# Patient Record
Sex: Female | Born: 1950
Health system: Southern US, Community
[De-identification: ages and names within clinical notes are randomized; demographics above are authoritative.]

## PROBLEM LIST (undated history)

## (undated) DIAGNOSIS — M545 Low back pain, unspecified: Secondary | ICD-10-CM

## (undated) DIAGNOSIS — M797 Fibromyalgia: Secondary | ICD-10-CM

## (undated) DIAGNOSIS — R202 Paresthesia of skin: Secondary | ICD-10-CM

## (undated) DIAGNOSIS — I341 Nonrheumatic mitral (valve) prolapse: Secondary | ICD-10-CM

## (undated) DIAGNOSIS — E663 Overweight: Secondary | ICD-10-CM

## (undated) DIAGNOSIS — Z87898 Personal history of other specified conditions: Secondary | ICD-10-CM

## (undated) DIAGNOSIS — K5730 Diverticulosis of large intestine without perforation or abscess without bleeding: Secondary | ICD-10-CM

## (undated) DIAGNOSIS — M1990 Unspecified osteoarthritis, unspecified site: Secondary | ICD-10-CM

## (undated) DIAGNOSIS — R519 Headache, unspecified: Secondary | ICD-10-CM

## (undated) DIAGNOSIS — D649 Anemia, unspecified: Secondary | ICD-10-CM

## (undated) DIAGNOSIS — I10 Essential (primary) hypertension: Secondary | ICD-10-CM

## (undated) DIAGNOSIS — K219 Gastro-esophageal reflux disease without esophagitis: Secondary | ICD-10-CM

## (undated) DIAGNOSIS — D573 Sickle-cell trait: Secondary | ICD-10-CM

## (undated) DIAGNOSIS — H269 Unspecified cataract: Secondary | ICD-10-CM

## (undated) DIAGNOSIS — Z8489 Family history of other specified conditions: Secondary | ICD-10-CM

## (undated) HISTORY — DX: Essential (primary) hypertension: I10

## (undated) HISTORY — DX: Unspecified osteoarthritis, unspecified site: M19.90

## (undated) HISTORY — DX: Nonrheumatic mitral (valve) prolapse: I34.1

## (undated) HISTORY — DX: Diverticulosis of large intestine without perforation or abscess without bleeding: K57.30

## (undated) HISTORY — PX: TUBAL LIGATION: SHX77

## (undated) HISTORY — DX: Low back pain: M54.5

## (undated) HISTORY — DX: Paresthesia of skin: R20.2

## (undated) HISTORY — PX: UTERINE FIBROID SURGERY: SHX826

## (undated) HISTORY — DX: Fibromyalgia: M79.7

## (undated) HISTORY — DX: Low back pain, unspecified: M54.50

## (undated) HISTORY — DX: Overweight: E66.3

## (undated) HISTORY — DX: Sickle-cell trait: D57.3

## (undated) HISTORY — DX: Personal history of other specified conditions: Z87.898

## (undated) HISTORY — DX: Unspecified cataract: H26.9

---

## 1997-11-07 ENCOUNTER — Ambulatory Visit (HOSPITAL_COMMUNITY): Admission: RE | Admit: 1997-11-07 | Discharge: 1997-11-07 | Payer: Self-pay | Admitting: Obstetrics

## 1998-07-03 ENCOUNTER — Other Ambulatory Visit: Admission: RE | Admit: 1998-07-03 | Discharge: 1998-07-03 | Payer: Self-pay | Admitting: Obstetrics

## 1998-11-15 ENCOUNTER — Ambulatory Visit (HOSPITAL_COMMUNITY): Admission: RE | Admit: 1998-11-15 | Discharge: 1998-11-15 | Payer: Self-pay | Admitting: Obstetrics

## 1998-11-15 ENCOUNTER — Encounter: Payer: Self-pay | Admitting: Obstetrics

## 1998-11-22 ENCOUNTER — Encounter: Payer: Self-pay | Admitting: Obstetrics

## 1998-11-22 ENCOUNTER — Ambulatory Visit (HOSPITAL_COMMUNITY): Admission: RE | Admit: 1998-11-22 | Discharge: 1998-11-22 | Payer: Self-pay | Admitting: Obstetrics

## 1999-05-28 ENCOUNTER — Ambulatory Visit (HOSPITAL_COMMUNITY): Admission: RE | Admit: 1999-05-28 | Discharge: 1999-05-28 | Payer: Self-pay | Admitting: Obstetrics

## 1999-05-28 ENCOUNTER — Encounter: Payer: Self-pay | Admitting: Obstetrics

## 1999-07-18 ENCOUNTER — Other Ambulatory Visit: Admission: RE | Admit: 1999-07-18 | Discharge: 1999-07-18 | Payer: Self-pay | Admitting: Obstetrics

## 2000-06-03 ENCOUNTER — Ambulatory Visit (HOSPITAL_COMMUNITY): Admission: RE | Admit: 2000-06-03 | Discharge: 2000-06-03 | Payer: Self-pay | Admitting: Obstetrics

## 2000-06-03 ENCOUNTER — Encounter: Payer: Self-pay | Admitting: Obstetrics

## 2000-08-25 ENCOUNTER — Other Ambulatory Visit: Admission: RE | Admit: 2000-08-25 | Discharge: 2000-08-25 | Payer: Self-pay | Admitting: Obstetrics

## 2001-02-17 ENCOUNTER — Emergency Department (HOSPITAL_COMMUNITY): Admission: EM | Admit: 2001-02-17 | Discharge: 2001-02-17 | Payer: Self-pay | Admitting: Emergency Medicine

## 2001-06-11 ENCOUNTER — Ambulatory Visit (HOSPITAL_COMMUNITY): Admission: RE | Admit: 2001-06-11 | Discharge: 2001-06-11 | Payer: Self-pay | Admitting: Obstetrics

## 2001-06-11 ENCOUNTER — Encounter: Payer: Self-pay | Admitting: Obstetrics

## 2002-06-17 ENCOUNTER — Encounter: Payer: Self-pay | Admitting: Obstetrics

## 2002-06-17 ENCOUNTER — Ambulatory Visit (HOSPITAL_COMMUNITY): Admission: RE | Admit: 2002-06-17 | Discharge: 2002-06-17 | Payer: Self-pay | Admitting: Obstetrics & Gynecology

## 2003-07-03 ENCOUNTER — Ambulatory Visit (HOSPITAL_COMMUNITY): Admission: RE | Admit: 2003-07-03 | Discharge: 2003-07-03 | Payer: Self-pay | Admitting: Obstetrics

## 2004-06-05 ENCOUNTER — Ambulatory Visit: Payer: Self-pay | Admitting: Pulmonary Disease

## 2004-06-06 ENCOUNTER — Ambulatory Visit: Payer: Self-pay | Admitting: Pulmonary Disease

## 2004-06-14 ENCOUNTER — Ambulatory Visit: Payer: Self-pay

## 2004-07-05 ENCOUNTER — Ambulatory Visit (HOSPITAL_COMMUNITY): Admission: RE | Admit: 2004-07-05 | Discharge: 2004-07-05 | Payer: Self-pay | Admitting: Obstetrics

## 2004-12-03 ENCOUNTER — Ambulatory Visit (HOSPITAL_COMMUNITY): Admission: RE | Admit: 2004-12-03 | Discharge: 2004-12-03 | Payer: Self-pay | Admitting: Orthopaedic Surgery

## 2004-12-13 ENCOUNTER — Encounter: Admission: RE | Admit: 2004-12-13 | Discharge: 2004-12-13 | Payer: Self-pay | Admitting: Orthopaedic Surgery

## 2004-12-30 ENCOUNTER — Encounter: Admission: RE | Admit: 2004-12-30 | Discharge: 2004-12-30 | Payer: Self-pay | Admitting: Orthopaedic Surgery

## 2005-02-26 ENCOUNTER — Encounter: Admission: RE | Admit: 2005-02-26 | Discharge: 2005-02-26 | Payer: Self-pay | Admitting: Orthopaedic Surgery

## 2005-05-19 ENCOUNTER — Ambulatory Visit: Payer: Self-pay | Admitting: Pulmonary Disease

## 2005-08-29 ENCOUNTER — Encounter: Admission: RE | Admit: 2005-08-29 | Discharge: 2005-08-29 | Payer: Self-pay | Admitting: Obstetrics

## 2006-04-23 ENCOUNTER — Ambulatory Visit: Payer: Self-pay | Admitting: Pulmonary Disease

## 2006-08-19 ENCOUNTER — Ambulatory Visit (HOSPITAL_COMMUNITY): Admission: RE | Admit: 2006-08-19 | Discharge: 2006-08-19 | Payer: Self-pay | Admitting: Obstetrics

## 2007-08-24 ENCOUNTER — Ambulatory Visit (HOSPITAL_COMMUNITY): Admission: RE | Admit: 2007-08-24 | Discharge: 2007-08-24 | Payer: Self-pay | Admitting: Obstetrics

## 2007-10-04 ENCOUNTER — Telehealth: Payer: Self-pay | Admitting: Pulmonary Disease

## 2007-10-16 ENCOUNTER — Encounter: Payer: Self-pay | Admitting: Pulmonary Disease

## 2007-10-16 DIAGNOSIS — R519 Headache, unspecified: Secondary | ICD-10-CM | POA: Insufficient documentation

## 2007-10-16 DIAGNOSIS — IMO0001 Reserved for inherently not codable concepts without codable children: Secondary | ICD-10-CM

## 2007-10-16 DIAGNOSIS — I10 Essential (primary) hypertension: Secondary | ICD-10-CM | POA: Insufficient documentation

## 2007-10-16 DIAGNOSIS — Z8679 Personal history of other diseases of the circulatory system: Secondary | ICD-10-CM | POA: Insufficient documentation

## 2007-10-16 DIAGNOSIS — R209 Unspecified disturbances of skin sensation: Secondary | ICD-10-CM

## 2007-10-16 DIAGNOSIS — R51 Headache: Secondary | ICD-10-CM

## 2007-10-21 ENCOUNTER — Ambulatory Visit: Payer: Self-pay | Admitting: Pulmonary Disease

## 2007-10-21 DIAGNOSIS — E663 Overweight: Secondary | ICD-10-CM

## 2007-10-21 DIAGNOSIS — F411 Generalized anxiety disorder: Secondary | ICD-10-CM

## 2007-10-21 DIAGNOSIS — K5730 Diverticulosis of large intestine without perforation or abscess without bleeding: Secondary | ICD-10-CM

## 2007-10-21 DIAGNOSIS — M545 Low back pain: Secondary | ICD-10-CM

## 2007-10-22 ENCOUNTER — Ambulatory Visit: Payer: Self-pay | Admitting: Pulmonary Disease

## 2007-11-03 ENCOUNTER — Telehealth: Payer: Self-pay | Admitting: Pulmonary Disease

## 2007-11-03 LAB — CONVERTED CEMR LAB
ALT: 14 units/L (ref 0–35)
BUN: 14 mg/dL (ref 6–23)
Basophils Absolute: 0 10*3/uL (ref 0.0–0.1)
Basophils Relative: 0.7 % (ref 0.0–1.0)
Bilirubin, Direct: 0.2 mg/dL (ref 0.0–0.3)
CO2: 31 meq/L (ref 19–32)
Chloride: 109 meq/L (ref 96–112)
Creatinine, Ser: 0.9 mg/dL (ref 0.4–1.2)
GFR calc Af Amer: 83 mL/min
Glucose, Bld: 98 mg/dL (ref 70–99)
HCT: 44.3 % (ref 36.0–46.0)
Hemoglobin: 14.1 g/dL (ref 12.0–15.0)
MCHC: 31.8 g/dL (ref 30.0–36.0)
MCV: 79.1 fL (ref 78.0–100.0)
Neutro Abs: 1.8 10*3/uL (ref 1.4–7.7)
Platelets: 241 10*3/uL (ref 150–400)
Potassium: 3.8 meq/L (ref 3.5–5.1)
RBC: 5.6 M/uL — ABNORMAL HIGH (ref 3.87–5.11)
TSH: 1.09 microintl units/mL (ref 0.35–5.50)
Total CHOL/HDL Ratio: 3.7

## 2008-09-08 ENCOUNTER — Ambulatory Visit (HOSPITAL_COMMUNITY): Admission: RE | Admit: 2008-09-08 | Discharge: 2008-09-08 | Payer: Self-pay | Admitting: Obstetrics

## 2009-01-25 ENCOUNTER — Ambulatory Visit: Payer: Self-pay | Admitting: Pulmonary Disease

## 2009-02-01 ENCOUNTER — Ambulatory Visit: Payer: Self-pay | Admitting: Pulmonary Disease

## 2009-02-06 LAB — CONVERTED CEMR LAB: Vit D, 25-Hydroxy: 12 ng/mL — ABNORMAL LOW (ref 30–89)

## 2009-03-20 LAB — CONVERTED CEMR LAB
ALT: 16 units/L (ref 0–35)
Alkaline Phosphatase: 91 units/L (ref 39–117)
BUN: 12 mg/dL (ref 6–23)
Bilirubin, Direct: 0.1 mg/dL (ref 0.0–0.3)
Cholesterol: 157 mg/dL (ref 0–200)
GFR calc non Af Amer: 82.64 mL/min (ref >60)
Glucose, Bld: 101 mg/dL — ABNORMAL HIGH (ref 70–99)
HCT: 44.2 % (ref 36.0–46.0)
Hemoglobin: 14.2 g/dL (ref 12.0–15.0)
Ketones, ur: NEGATIVE mg/dL
Lymphocytes Relative: 43.1 % (ref 12.0–46.0)
MCHC: 32.2 g/dL (ref 30.0–36.0)
Neutro Abs: 1.7 10*3/uL (ref 1.4–7.7)
Platelets: 198 10*3/uL (ref 150.0–400.0)
RBC: 5.44 M/uL — ABNORMAL HIGH (ref 3.87–5.11)
RDW: 14.4 % (ref 11.5–14.6)
Specific Gravity, Urine: 1.025 (ref 1.000–1.030)
Total Bilirubin: 1.1 mg/dL (ref 0.3–1.2)
Total CHOL/HDL Ratio: 4
Total Protein, Urine: NEGATIVE mg/dL
Triglycerides: 47 mg/dL (ref 0.0–149.0)

## 2009-06-27 HISTORY — PX: DILATION AND CURETTAGE OF UTERUS: SHX78

## 2009-06-28 ENCOUNTER — Ambulatory Visit (HOSPITAL_COMMUNITY): Admission: RE | Admit: 2009-06-28 | Discharge: 2009-06-28 | Payer: Self-pay | Admitting: Obstetrics

## 2009-10-05 ENCOUNTER — Ambulatory Visit (HOSPITAL_COMMUNITY): Admission: RE | Admit: 2009-10-05 | Discharge: 2009-10-05 | Payer: Self-pay | Admitting: Obstetrics

## 2010-10-17 ENCOUNTER — Other Ambulatory Visit (HOSPITAL_COMMUNITY): Payer: Self-pay | Admitting: Obstetrics

## 2010-10-17 DIAGNOSIS — Z1231 Encounter for screening mammogram for malignant neoplasm of breast: Secondary | ICD-10-CM

## 2010-10-29 LAB — CBC
HCT: 44.3 % (ref 36.0–46.0)
Hemoglobin: 14.4 g/dL (ref 12.0–15.0)
MCV: 80.5 fL (ref 78.0–100.0)
RBC: 5.5 MIL/uL — ABNORMAL HIGH (ref 3.87–5.11)
RDW: 15.9 % — ABNORMAL HIGH (ref 11.5–15.5)
WBC: 3.7 10*3/uL — ABNORMAL LOW (ref 4.0–10.5)

## 2010-10-29 LAB — BASIC METABOLIC PANEL
BUN: 8 mg/dL (ref 6–23)
Calcium: 9.4 mg/dL (ref 8.4–10.5)
Glucose, Bld: 92 mg/dL (ref 70–99)

## 2010-11-07 ENCOUNTER — Ambulatory Visit (HOSPITAL_COMMUNITY)
Admission: RE | Admit: 2010-11-07 | Discharge: 2010-11-07 | Disposition: A | Discharge status: Home / Self Care | Payer: 59 | Source: Ambulatory Visit | Attending: Obstetrics | Admitting: Obstetrics

## 2010-11-07 DIAGNOSIS — Z1231 Encounter for screening mammogram for malignant neoplasm of breast: Secondary | ICD-10-CM | POA: Insufficient documentation

## 2010-12-09 ENCOUNTER — Telehealth: Payer: Self-pay | Admitting: Pulmonary Disease

## 2010-12-09 NOTE — Telephone Encounter (Signed)
Can offer appt for 5-16 at 9:30.  thanks

## 2010-12-09 NOTE — Telephone Encounter (Signed)
Pt phoned stated she was returning a call she can be reached at [phone].Janice Hutchinson

## 2010-12-09 NOTE — Telephone Encounter (Signed)
lmomtcb x1 

## 2010-12-09 NOTE — Telephone Encounter (Signed)
Pt last here for CPX 01/2009. Pt thought she was here last year for physical. Pt will be changing insurance at the end of May and would like to know if she can be worked in before then, even if it's just for medication refills. Pls advise.

## 2010-12-10 NOTE — Telephone Encounter (Signed)
Left message with female for pt to return call for appointment available 12/11/10 at 9:30am. He advised he will "text" her and let her know. Pt is not allowed to carry cell phone into her place of work.

## 2010-12-10 NOTE — Telephone Encounter (Signed)
Due to work pt unable to come in tomorrow. Scheduled for 12/26/10 @ 3 pm. Ok per Leigh A.

## 2010-12-18 ENCOUNTER — Encounter: Payer: Self-pay | Admitting: Pulmonary Disease

## 2010-12-26 ENCOUNTER — Encounter: Payer: 59 | Admitting: Pulmonary Disease

## 2010-12-27 HISTORY — PX: TENDON REPAIR: SHX5111

## 2011-02-10 ENCOUNTER — Encounter: Payer: Self-pay | Admitting: Pulmonary Disease

## 2011-02-10 ENCOUNTER — Ambulatory Visit (INDEPENDENT_AMBULATORY_CARE_PROVIDER_SITE_OTHER): Payer: BC Managed Care – PPO | Admitting: Pulmonary Disease

## 2011-02-10 VITALS — BP 118/82 | HR 60 | Temp 98.1°F | Ht 67.0 in | Wt 204.0 lb

## 2011-02-10 DIAGNOSIS — E663 Overweight: Secondary | ICD-10-CM

## 2011-02-10 DIAGNOSIS — Z0000 Encounter for general adult medical examination without abnormal findings: Secondary | ICD-10-CM

## 2011-02-10 DIAGNOSIS — IMO0001 Reserved for inherently not codable concepts without codable children: Secondary | ICD-10-CM

## 2011-02-10 DIAGNOSIS — I10 Essential (primary) hypertension: Secondary | ICD-10-CM

## 2011-02-10 DIAGNOSIS — M545 Low back pain, unspecified: Secondary | ICD-10-CM

## 2011-02-10 DIAGNOSIS — F411 Generalized anxiety disorder: Secondary | ICD-10-CM

## 2011-02-10 DIAGNOSIS — S99929A Unspecified injury of unspecified foot, initial encounter: Secondary | ICD-10-CM | POA: Insufficient documentation

## 2011-02-10 DIAGNOSIS — S8990XA Unspecified injury of unspecified lower leg, initial encounter: Secondary | ICD-10-CM

## 2011-02-10 MED ORDER — BISOPROLOL-HYDROCHLOROTHIAZIDE 5-6.25 MG PO TABS: 1.0000 | ORAL_TABLET | Freq: Every day | ORAL | Status: DC

## 2011-02-10 MED ORDER — AMLODIPINE BESYLATE 5 MG PO TABS: 5.0000 mg | ORAL_TABLET | Freq: Every day | ORAL | Status: DC

## 2011-02-10 NOTE — Patient Instructions (Signed)
Today we updated your med list in EPIC...    We refilled your meds for 90d supplies...  Please return to our lab for your fasting blood work & we will try to set this up on the same morning as your BoneDensity test...    Please call the PHONE TREE in a few days for your results...    Dial [id number] & when prompted enter your patient number followed by the # symbol...    Your patient number is:  [id number]  Let's get on track w/ our diet & exercise program (now that your foot is improving)...  Call for any questions...  Let's plan a follow up exam in 1 yr, sooner if needed for problems.Marland KitchenMarland Kitchen
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
## 2011-02-11 ENCOUNTER — Encounter: Payer: Self-pay | Admitting: Pulmonary Disease

## 2011-02-13 ENCOUNTER — Ambulatory Visit (INDEPENDENT_AMBULATORY_CARE_PROVIDER_SITE_OTHER)
Admission: RE | Admit: 2011-02-13 | Discharge: 2011-02-13 | Disposition: A | Discharge status: Home / Self Care | Payer: BC Managed Care – PPO | Source: Ambulatory Visit | Attending: Pulmonary Disease | Admitting: Pulmonary Disease

## 2011-02-13 ENCOUNTER — Other Ambulatory Visit (INDEPENDENT_AMBULATORY_CARE_PROVIDER_SITE_OTHER): Payer: BC Managed Care – PPO

## 2011-02-13 DIAGNOSIS — Z0000 Encounter for general adult medical examination without abnormal findings: Secondary | ICD-10-CM

## 2011-02-13 DIAGNOSIS — Z13820 Encounter for screening for osteoporosis: Secondary | ICD-10-CM

## 2011-02-13 LAB — HEPATIC FUNCTION PANEL
AST: 15 U/L (ref 0–37)
Albumin: 3.9 g/dL (ref 3.5–5.2)
Alkaline Phosphatase: 77 U/L (ref 39–117)
Bilirubin, Direct: 0.2 mg/dL (ref 0.0–0.3)
Total Protein: 7.9 g/dL (ref 6.0–8.3)

## 2011-02-13 LAB — CBC WITH DIFFERENTIAL/PLATELET
Basophils Absolute: 0 10*3/uL (ref 0.0–0.1)
Eosinophils Absolute: 0 10*3/uL (ref 0.0–0.7)
HCT: 45.1 % (ref 36.0–46.0)
Hemoglobin: 14.7 g/dL (ref 12.0–15.0)
Lymphs Abs: 2.1 10*3/uL (ref 0.7–4.0)
MCHC: 32.7 g/dL (ref 30.0–36.0)
Monocytes Relative: 6.2 % (ref 3.0–12.0)
Neutro Abs: 1.7 10*3/uL (ref 1.4–7.7)
Platelets: 223 10*3/uL (ref 150.0–400.0)
RDW: 15.5 % — ABNORMAL HIGH (ref 11.5–14.6)

## 2011-02-13 LAB — BASIC METABOLIC PANEL
CO2: 27 mEq/L (ref 19–32)
Calcium: 9.4 mg/dL (ref 8.4–10.5)
GFR: 95.39 mL/min (ref >60.00)
Glucose, Bld: 93 mg/dL (ref 70–99)
Potassium: 3.8 mEq/L (ref 3.5–5.1)
Sodium: 142 mEq/L (ref 135–145)

## 2011-02-13 LAB — LIPID PANEL: Total CHOL/HDL Ratio: 3

## 2011-02-13 LAB — TSH: TSH: 0.94 u[IU]/mL (ref 0.35–5.50)

## 2011-02-21 ENCOUNTER — Telehealth: Payer: Self-pay | Admitting: Pulmonary Disease

## 2011-02-21 NOTE — Telephone Encounter (Signed)
Will forward to Leigh;states she called the patient regarding some papers.

## 2011-02-21 NOTE — Telephone Encounter (Signed)
lmomtcb to let pt know of BMD results.  Per SN---BMD is normal--rec to cont calcium, mvi, and vit d 1000 daily.  Will wait for pt to call me back

## 2011-02-21 NOTE — Telephone Encounter (Signed)
Called and spoke with pt and she is aware of BMD results.

## 2011-02-21 NOTE — Telephone Encounter (Signed)
Pt returning call can be reached at [phone].Janice Hutchinson

## 2011-11-14 ENCOUNTER — Other Ambulatory Visit (HOSPITAL_COMMUNITY): Payer: Self-pay | Admitting: Obstetrics

## 2011-11-14 DIAGNOSIS — Z1231 Encounter for screening mammogram for malignant neoplasm of breast: Secondary | ICD-10-CM

## 2011-11-25 ENCOUNTER — Telehealth: Payer: Self-pay | Admitting: Pulmonary Disease

## 2011-11-25 MED ORDER — FLUTICASONE PROPIONATE 50 MCG/ACT NA SUSP: NASAL | Status: DC

## 2011-11-25 NOTE — Telephone Encounter (Signed)
Per SN---mucinex 600mg   2 po bid  With plenty of fluids, allegra otc  1 daily, and flonase 2 sprays each nostril bid.  thanks

## 2011-11-25 NOTE — Telephone Encounter (Signed)
I spoke with pt and is aware of SN recs. I have sent rx for flonase into the phramacy and pt aware of directions.

## 2011-11-25 NOTE — Telephone Encounter (Signed)
I spoke with pt and she c/o stuffy nose, HA, sneezing, watery/itchy eyes, sinus pressure, bilateral ear pain, scratchy throat x Saturday. Denies any f/c/s/n/v. Pt has not taken anything OTC bc she has HBP. Pt is requesting recs from SN. Please advise thanks  No Known Allergies    [facility] 

## 2011-12-09 ENCOUNTER — Ambulatory Visit (HOSPITAL_COMMUNITY): Payer: BC Managed Care – PPO

## 2011-12-25 ENCOUNTER — Telehealth: Payer: Self-pay | Admitting: Pulmonary Disease

## 2011-12-25 NOTE — Telephone Encounter (Signed)
Pt is going to have a root canal done and wants to know if she needs abx prior to this. She had to in 2004 when she had her last root canal. Please advise SN thanks

## 2011-12-26 NOTE — Telephone Encounter (Signed)
Per SN---guidelines have changed and she will not need abx prior to dental work.  Called and spoke with pt and she is aware of this and will call her dentist to make them aware.

## 2012-01-01 ENCOUNTER — Ambulatory Visit (HOSPITAL_COMMUNITY)
Admission: RE | Admit: 2012-01-01 | Discharge: 2012-01-01 | Disposition: A | Discharge status: Home / Self Care | Payer: BC Managed Care – PPO | Source: Ambulatory Visit | Attending: Obstetrics | Admitting: Obstetrics

## 2012-01-01 DIAGNOSIS — Z1231 Encounter for screening mammogram for malignant neoplasm of breast: Secondary | ICD-10-CM | POA: Insufficient documentation

## 2012-03-11 ENCOUNTER — Telehealth: Payer: Self-pay | Admitting: Pulmonary Disease

## 2012-03-11 NOTE — Telephone Encounter (Signed)
Pt returned triage's call & can be reached at [phone].  Janice Hutchinson

## 2012-03-11 NOTE — Telephone Encounter (Signed)
Lm w/ family member to call back 

## 2012-03-12 MED ORDER — AMLODIPINE BESYLATE 5 MG PO TABS: 5.0000 mg | ORAL_TABLET | Freq: Every day | ORAL | Status: DC

## 2012-03-12 MED ORDER — BISOPROLOL-HYDROCHLOROTHIAZIDE 5-6.25 MG PO TABS: 1.0000 | ORAL_TABLET | Freq: Every day | ORAL | Status: DC

## 2012-03-12 NOTE — Telephone Encounter (Signed)
Spoke with pt and refills sent to last until appt on 10/8. Pt aware to keep appt.Carron Curie, CMA

## 2012-04-23 ENCOUNTER — Ambulatory Visit: Payer: BC Managed Care – PPO | Admitting: Pulmonary Disease

## 2012-05-03 ENCOUNTER — Encounter: Payer: Self-pay | Admitting: *Deleted

## 2012-05-04 ENCOUNTER — Other Ambulatory Visit (INDEPENDENT_AMBULATORY_CARE_PROVIDER_SITE_OTHER): Payer: BC Managed Care – PPO

## 2012-05-04 ENCOUNTER — Ambulatory Visit (INDEPENDENT_AMBULATORY_CARE_PROVIDER_SITE_OTHER): Payer: BC Managed Care – PPO | Admitting: Pulmonary Disease

## 2012-05-04 ENCOUNTER — Encounter: Payer: Self-pay | Admitting: Gastroenterology

## 2012-05-04 ENCOUNTER — Encounter: Payer: Self-pay | Admitting: Pulmonary Disease

## 2012-05-04 VITALS — BP 142/92 | HR 60 | Temp 98.3°F | Ht 67.0 in | Wt 208.6 lb

## 2012-05-04 DIAGNOSIS — I10 Essential (primary) hypertension: Secondary | ICD-10-CM

## 2012-05-04 DIAGNOSIS — K5730 Diverticulosis of large intestine without perforation or abscess without bleeding: Secondary | ICD-10-CM

## 2012-05-04 DIAGNOSIS — Z0000 Encounter for general adult medical examination without abnormal findings: Secondary | ICD-10-CM

## 2012-05-04 DIAGNOSIS — M545 Low back pain: Secondary | ICD-10-CM

## 2012-05-04 LAB — CBC WITH DIFFERENTIAL/PLATELET
Basophils Relative: 0.7 % (ref 0.0–3.0)
Eosinophils Relative: 1.1 % (ref 0.0–5.0)
HCT: 48.1 % — ABNORMAL HIGH (ref 36.0–46.0)
Hemoglobin: 15.3 g/dL — ABNORMAL HIGH (ref 12.0–15.0)
Lymphs Abs: 2.1 10*3/uL (ref 0.7–4.0)
MCV: 79.9 fl (ref 78.0–100.0)
Monocytes Absolute: 0.3 10*3/uL (ref 0.1–1.0)
Neutro Abs: 1.6 10*3/uL (ref 1.4–7.7)
Platelets: 241 10*3/uL (ref 150.0–400.0)
RBC: 6.02 Mil/uL — ABNORMAL HIGH (ref 3.87–5.11)
WBC: 4.1 10*3/uL — ABNORMAL LOW (ref 4.5–10.5)

## 2012-05-04 LAB — HEPATIC FUNCTION PANEL
AST: 16 U/L (ref 0–37)
Total Bilirubin: 1.6 mg/dL — ABNORMAL HIGH (ref 0.3–1.2)

## 2012-05-04 LAB — LIPID PANEL
Cholesterol: 177 mg/dL (ref 0–200)
HDL: 47.4 mg/dL (ref >39.00)
LDL Cholesterol: 114 mg/dL — ABNORMAL HIGH (ref 0–99)
VLDL: 15.6 mg/dL (ref 0.0–40.0)

## 2012-05-04 LAB — BASIC METABOLIC PANEL
BUN: 14 mg/dL (ref 6–23)
Calcium: 9.9 mg/dL (ref 8.4–10.5)
GFR: 83.88 mL/min (ref >60.00)
Potassium: 4.2 mEq/L (ref 3.5–5.1)

## 2012-05-04 MED ORDER — AMLODIPINE BESYLATE 5 MG PO TABS: 5.0000 mg | ORAL_TABLET | Freq: Every day | ORAL | Status: DC

## 2012-05-04 MED ORDER — BISOPROLOL-HYDROCHLOROTHIAZIDE 5-6.25 MG PO TABS: 1.0000 | ORAL_TABLET | Freq: Every day | ORAL | Status: DC

## 2012-05-04 NOTE — Patient Instructions (Addendum)
Today we updated your med list in our EPIC system...    Continue your current medications the same...  Today we did your follow up FASTING blood work...    We will call you w/ the results when avail...  Let's get on track w/ our diet & exercise program...    The goal is to lose another 10-15 lbs...  Call for any questions...  Let's plan a follow up visit in 1 years time, sooner if needed for any reason.Marland KitchenMarland Kitchen
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
## 2012-05-11 ENCOUNTER — Telehealth: Payer: Self-pay | Admitting: Pulmonary Disease

## 2012-05-11 NOTE — Telephone Encounter (Signed)
lmomtcb for the pt to call back  

## 2012-05-11 NOTE — Telephone Encounter (Signed)
Spoke with pt and she is aware of lab results per SN.

## 2012-06-07 ENCOUNTER — Ambulatory Visit: Payer: BC Managed Care – PPO | Admitting: Gastroenterology

## 2012-06-18 ENCOUNTER — Encounter: Payer: Self-pay | Admitting: Gastroenterology

## 2012-06-18 ENCOUNTER — Other Ambulatory Visit: Payer: Self-pay | Admitting: *Deleted

## 2012-06-18 ENCOUNTER — Encounter: Payer: BC Managed Care – PPO | Admitting: Gastroenterology

## 2012-06-18 ENCOUNTER — Ambulatory Visit: Payer: BC Managed Care – PPO | Admitting: Gastroenterology

## 2012-06-18 DIAGNOSIS — K5790 Diverticulosis of intestine, part unspecified, without perforation or abscess without bleeding: Secondary | ICD-10-CM

## 2012-06-18 MED ORDER — NA SULFATE-K SULFATE-MG SULF 17.5-3.13-1.6 GM/177ML PO SOLN: 1.0000 | Freq: Once | ORAL | Status: DC

## 2012-06-30 NOTE — Progress Notes (Signed)
error    This encounter was created in error - please disregard.

## 2012-08-06 ENCOUNTER — Ambulatory Visit (AMBULATORY_SURGERY_CENTER): Payer: BC Managed Care – PPO | Admitting: Gastroenterology

## 2012-08-06 ENCOUNTER — Encounter: Payer: BC Managed Care – PPO | Admitting: Gastroenterology

## 2012-08-06 ENCOUNTER — Encounter: Payer: Self-pay | Admitting: Gastroenterology

## 2012-08-06 VITALS — BP 128/77 | HR 56 | Temp 98.6°F | Resp 15 | Ht 67.0 in | Wt 208.0 lb

## 2012-08-06 DIAGNOSIS — Z1211 Encounter for screening for malignant neoplasm of colon: Secondary | ICD-10-CM

## 2012-08-06 MED ORDER — SODIUM CHLORIDE 0.9 % IV SOLN: 500.0000 mL | INTRAVENOUS | Status: DC

## 2012-08-06 NOTE — Progress Notes (Signed)
Patient did not experience any of the following events: a burn prior to discharge; a fall within the facility; wrong site/side/patient/procedure/implant event; or a hospital transfer or hospital admission upon discharge from the facility. (G8907) Patient did not have preoperative order for IV antibiotic SSI prophylaxis. (G8918)  

## 2012-08-06 NOTE — Op Note (Signed)
[facility] [address] [place], [address]   COLONOSCOPY PROCEDURE REPORT  PATIENT: Janice Hutchinson, Janice Hutchinson  MR#: [id number] BIRTHDATE: August 11, 1950 , 61  yrs. old GENDER: Female ENDOSCOPIST: Louis Meckel, MD REFERRED BY: PROCEDURE DATE:  08/06/2012 PROCEDURE:   Colonoscopy, diagnostic ASA CLASS: INDICATIONS: MEDICATIONS: MAC sedation, administered by CRNA and Propofol (Diprivan) 240 mg IV  DESCRIPTION OF PROCEDURE:   After the risks benefits and alternatives of the procedure were thoroughly explained, informed consent was obtained.  A digital rectal exam revealed no abnormalities of the rectum.   The LB [id number]  endoscope was introduced through the anus and advanced to the cecum, which was identified by both the appendix and ileocecal valve. No adverse events experienced.   The quality of the prep was Suprep excellent The instrument was then slowly withdrawn as the colon was fully examined.      COLON FINDINGS: A normal appearing cecum, ileocecal valve, and appendiceal orifice were identified.  The ascending, hepatic flexure, transverse, splenic flexure, descending, sigmoid colon and rectum appeared unremarkable.  No polyps or cancers were seen. Retroflexed views revealed no abnormalities. The time to cecum=3 minutes 11 seconds.  Withdrawal time=7 minutes 08 seconds.  The scope was withdrawn and the procedure completed. COMPLICATIONS: There were no complications.  ENDOSCOPIC IMPRESSION: Normal colon  RECOMMENDATIONS: Continue current colorectal screening recommendations for "routine risk" patients with a repeat colonoscopy in 10 years.   eSigned:  Louis Meckel, MD 08/06/2012 4:04 PM   cc: Michele Mcalpine, MD

## 2012-08-06 NOTE — Patient Instructions (Addendum)
Discharge instructions given with verbal understanding. Normal exam. Resume previous medications. YOU HAD AN ENDOSCOPIC PROCEDURE TODAY AT THE [facility]: Refer to the procedure report that was given to you for any specific questions about what was found during the examination.  If the procedure report does not answer your questions, please call your gastroenterologist to clarify.  If you requested that your care partner not be given the details of your procedure findings, then the procedure report has been included in a sealed envelope for you to review at your convenience later.  YOU SHOULD EXPECT: Some feelings of bloating in the abdomen. Passage of more gas than usual.  Walking can help get rid of the air that was put into your GI tract during the procedure and reduce the bloating. If you had a lower endoscopy (such as a colonoscopy or flexible sigmoidoscopy) you may notice spotting of blood in your stool or on the toilet paper. If you underwent a bowel prep for your procedure, then you may not have a normal bowel movement for a few days.  DIET: Your first meal following the procedure should be a light meal and then it is ok to progress to your normal diet.  A half-sandwich or bowl of soup is an example of a good first meal.  Heavy or fried foods are harder to digest and may make you feel nauseous or bloated.  Likewise meals heavy in dairy and vegetables can cause extra gas to form and this can also increase the bloating.  Drink plenty of fluids but you should avoid alcoholic beverages for 24 hours.  ACTIVITY: Your care partner should take you home directly after the procedure.  You should plan to take it easy, moving slowly for the rest of the day.  You can resume normal activity the day after the procedure however you should NOT DRIVE or use heavy machinery for 24 hours (because of the sedation medicines used during the test).    SYMPTOMS TO REPORT IMMEDIATELY: A gastroenterologist  can be reached at any hour.  During normal business hours, 8:30 AM to 5:00 PM Monday through Friday, call [phone].  After hours and on weekends, please call the GI answering service at [phone] who will take a message and have the physician on call contact you.   Following lower endoscopy (colonoscopy or flexible sigmoidoscopy):  Excessive amounts of blood in the stool  Significant tenderness or worsening of abdominal pains  Swelling of the abdomen that is new, acute  Fever of 100F or higher  FOLLOW UP: If any biopsies were taken you will be contacted by phone or by letter within the next 1-3 weeks.  Call your gastroenterologist if you have not heard about the biopsies in 3 weeks.  Our staff will call the home number listed on your records the next business day following your procedure to check on you and address any questions or concerns that you may have at that time regarding the information given to you following your procedure. This is a courtesy call and so if there is no answer at the home number and we have not heard from you through the emergency physician on call, we will assume that you have returned to your regular daily activities without incident.  SIGNATURES/CONFIDENTIALITY: You and/or your care partner have signed paperwork which will be entered into your electronic medical record.  These signatures attest to the fact that that the information above on your After Visit Summary has been reviewed   and is understood.  Full responsibility of the confidentiality of this discharge information lies with you and/or your care-partner. 

## 2012-08-06 NOTE — Progress Notes (Signed)
Stable to RR 

## 2012-08-09 ENCOUNTER — Telehealth: Payer: Self-pay | Admitting: *Deleted

## 2012-08-09 NOTE — Telephone Encounter (Signed)
Called number given in admitting, no answer after 15 rings, no machine to leave message. emw

## 2012-12-08 ENCOUNTER — Other Ambulatory Visit (HOSPITAL_COMMUNITY): Payer: Self-pay | Admitting: Obstetrics

## 2012-12-08 DIAGNOSIS — Z1231 Encounter for screening mammogram for malignant neoplasm of breast: Secondary | ICD-10-CM

## 2013-01-04 ENCOUNTER — Ambulatory Visit (HOSPITAL_COMMUNITY)
Admission: RE | Admit: 2013-01-04 | Discharge: 2013-01-04 | Disposition: A | Discharge status: Home / Self Care | Payer: BC Managed Care – PPO | Source: Ambulatory Visit | Attending: Obstetrics | Admitting: Obstetrics

## 2013-01-04 DIAGNOSIS — Z1231 Encounter for screening mammogram for malignant neoplasm of breast: Secondary | ICD-10-CM | POA: Insufficient documentation

## 2013-05-04 ENCOUNTER — Encounter (INDEPENDENT_AMBULATORY_CARE_PROVIDER_SITE_OTHER): Payer: Self-pay

## 2013-05-04 ENCOUNTER — Encounter: Payer: Self-pay | Admitting: Pulmonary Disease

## 2013-05-04 ENCOUNTER — Ambulatory Visit (INDEPENDENT_AMBULATORY_CARE_PROVIDER_SITE_OTHER)
Admission: RE | Admit: 2013-05-04 | Discharge: 2013-05-04 | Disposition: A | Discharge status: Home / Self Care | Payer: BC Managed Care – PPO | Source: Ambulatory Visit | Attending: Pulmonary Disease | Admitting: Pulmonary Disease

## 2013-05-04 ENCOUNTER — Ambulatory Visit (INDEPENDENT_AMBULATORY_CARE_PROVIDER_SITE_OTHER): Payer: BC Managed Care – PPO | Admitting: Pulmonary Disease

## 2013-05-04 ENCOUNTER — Other Ambulatory Visit (INDEPENDENT_AMBULATORY_CARE_PROVIDER_SITE_OTHER): Payer: BC Managed Care – PPO

## 2013-05-04 VITALS — BP 130/86 | HR 60 | Temp 97.8°F | Ht 67.0 in | Wt 211.2 lb

## 2013-05-04 DIAGNOSIS — Z0000 Encounter for general adult medical examination without abnormal findings: Secondary | ICD-10-CM

## 2013-05-04 DIAGNOSIS — I10 Essential (primary) hypertension: Secondary | ICD-10-CM

## 2013-05-04 DIAGNOSIS — M1990 Unspecified osteoarthritis, unspecified site: Secondary | ICD-10-CM

## 2013-05-04 DIAGNOSIS — E663 Overweight: Secondary | ICD-10-CM

## 2013-05-04 DIAGNOSIS — M545 Low back pain: Secondary | ICD-10-CM

## 2013-05-04 DIAGNOSIS — F411 Generalized anxiety disorder: Secondary | ICD-10-CM

## 2013-05-04 DIAGNOSIS — I872 Venous insufficiency (chronic) (peripheral): Secondary | ICD-10-CM

## 2013-05-04 DIAGNOSIS — R51 Headache: Secondary | ICD-10-CM

## 2013-05-04 DIAGNOSIS — K5730 Diverticulosis of large intestine without perforation or abscess without bleeding: Secondary | ICD-10-CM

## 2013-05-04 LAB — HEPATIC FUNCTION PANEL
ALT: 18 U/L (ref 0–35)
AST: 16 U/L (ref 0–37)
Albumin: 3.8 g/dL (ref 3.5–5.2)
Alkaline Phosphatase: 72 U/L (ref 39–117)
Bilirubin, Direct: 0.1 mg/dL (ref 0.0–0.3)
Total Bilirubin: 1.1 mg/dL (ref 0.3–1.2)
Total Protein: 7.3 g/dL (ref 6.0–8.3)

## 2013-05-04 LAB — BASIC METABOLIC PANEL
BUN: 17 mg/dL (ref 6–23)
CO2: 27 mEq/L (ref 19–32)
Chloride: 109 mEq/L (ref 96–112)
Potassium: 4.4 mEq/L (ref 3.5–5.1)
Sodium: 142 mEq/L (ref 135–145)

## 2013-05-04 LAB — CBC WITH DIFFERENTIAL/PLATELET
Basophils Absolute: 0 10*3/uL (ref 0.0–0.1)
Basophils Relative: 0.6 % (ref 0.0–3.0)
Eosinophils Absolute: 0.1 10*3/uL (ref 0.0–0.7)
Eosinophils Relative: 1.2 % (ref 0.0–5.0)
MCHC: 32.8 g/dL (ref 30.0–36.0)
MCV: 79.1 fl (ref 78.0–100.0)
Monocytes Absolute: 0.4 10*3/uL (ref 0.1–1.0)
Neutro Abs: 2 10*3/uL (ref 1.4–7.7)
Neutrophils Relative %: 42.2 % — ABNORMAL LOW (ref 43.0–77.0)
Platelets: 224 10*3/uL (ref 150.0–400.0)
RBC: 5.67 Mil/uL — ABNORMAL HIGH (ref 3.87–5.11)
RDW: 15.3 % — ABNORMAL HIGH (ref 11.5–14.6)
WBC: 4.9 10*3/uL (ref 4.5–10.5)

## 2013-05-04 LAB — LIPID PANEL
Cholesterol: 171 mg/dL (ref 0–200)
HDL: 52.4 mg/dL (ref >39.00)
Total CHOL/HDL Ratio: 3
VLDL: 12.2 mg/dL (ref 0.0–40.0)

## 2013-05-04 MED ORDER — AMLODIPINE BESYLATE 5 MG PO TABS: 5.0000 mg | ORAL_TABLET | Freq: Every day | ORAL | Status: DC

## 2013-05-04 MED ORDER — BISOPROLOL-HYDROCHLOROTHIAZIDE 5-6.25 MG PO TABS: 1.0000 | ORAL_TABLET | Freq: Every day | ORAL | Status: DC
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
## 2013-05-04 NOTE — Patient Instructions (Signed)
Today we updated your med list in our EPIC system...    Continue your current medications the same...  Today we did your follow up CXR, EKG, & FASTING blood work...    We will contact you w/ the results when available...   Let's get on track w/ our diet & exercise program...    The goal is to lose 15-20lbs...  Call for any questions...  Let's plan a follow up visit in 39yr, sooner if needed for problems.Marland KitchenMarland Kitchen

## 2013-05-05 LAB — VITAMIN D 25 HYDROXY (VIT D DEFICIENCY, FRACTURES): Vit D, 25-Hydroxy: 34 ng/mL (ref 30–89)

## 2013-06-02 ENCOUNTER — Other Ambulatory Visit: Payer: Self-pay

## 2013-09-16 ENCOUNTER — Telehealth: Payer: Self-pay | Admitting: Pulmonary Disease

## 2013-09-16 MED ORDER — AMOXICILLIN-POT CLAVULANATE 875-125 MG PO TABS: 1.0000 | ORAL_TABLET | Freq: Two times a day (BID) | ORAL | Status: DC

## 2013-09-16 NOTE — Telephone Encounter (Signed)
Per SN---  augmentin 875 mg  #14  1 po bid Align once daily otc mucinex 600 mg  2 po bid Increase fluids Nasal saline mist  Called and spoke with pt and she is aware of SN recs and that meds have been sent to the pharmacy.

## 2013-09-16 NOTE — Telephone Encounter (Signed)
Spoke with pt. Reports chest/sinus congestion, dry cough, temp of 100.6 and headache x1 day. When blowing her nose, mucus is green in color. Has tried taking Chloracidine with no relief. Would like something called in.  No Known Allergies  SN - please advise. Thanks.

## 2013-12-02 ENCOUNTER — Emergency Department (HOSPITAL_COMMUNITY)
Admission: EM | Admit: 2013-12-02 | Discharge: 2013-12-02 | Disposition: A | Discharge status: Home / Self Care | Payer: BC Managed Care – PPO | Attending: Emergency Medicine | Admitting: Emergency Medicine

## 2013-12-02 ENCOUNTER — Encounter (HOSPITAL_COMMUNITY): Payer: Self-pay | Admitting: Emergency Medicine

## 2013-12-02 DIAGNOSIS — Z8719 Personal history of other diseases of the digestive system: Secondary | ICD-10-CM | POA: Insufficient documentation

## 2013-12-02 DIAGNOSIS — H00019 Hordeolum externum unspecified eye, unspecified eyelid: Secondary | ICD-10-CM | POA: Insufficient documentation

## 2013-12-02 DIAGNOSIS — Z7982 Long term (current) use of aspirin: Secondary | ICD-10-CM | POA: Insufficient documentation

## 2013-12-02 DIAGNOSIS — I10 Essential (primary) hypertension: Secondary | ICD-10-CM | POA: Insufficient documentation

## 2013-12-02 DIAGNOSIS — E663 Overweight: Secondary | ICD-10-CM | POA: Insufficient documentation

## 2013-12-02 DIAGNOSIS — IMO0001 Reserved for inherently not codable concepts without codable children: Secondary | ICD-10-CM | POA: Insufficient documentation

## 2013-12-02 DIAGNOSIS — Z862 Personal history of diseases of the blood and blood-forming organs and certain disorders involving the immune mechanism: Secondary | ICD-10-CM | POA: Insufficient documentation

## 2013-12-02 DIAGNOSIS — Z792 Long term (current) use of antibiotics: Secondary | ICD-10-CM | POA: Insufficient documentation

## 2013-12-02 DIAGNOSIS — Z79899 Other long term (current) drug therapy: Secondary | ICD-10-CM | POA: Insufficient documentation

## 2013-12-02 MED ORDER — ERYTHROMYCIN 5 MG/GM OP OINT: TOPICAL_OINTMENT | OPHTHALMIC | Status: DC

## 2013-12-02 NOTE — ED Notes (Signed)
The md saw before myself see triage and her notes

## 2013-12-02 NOTE — Discharge Instructions (Signed)
Sty A sty (hordeolum) is an infection of a gland in the eyelid located at the base of the eyelash. A sty may develop a white or yellow head of pus. It can be puffy (swollen). Usually, the sty will burst and pus will come out on its own. They do not leave lumps in the eyelid once they drain. A sty is often confused with another form of cyst of the eyelid called a chalazion. Chalazions occur within the eyelid and not on the edge where the bases of the eyelashes are. They often are red, sore and then form firm lumps in the eyelid. CAUSES   Germs (bacteria).  Lasting (chronic) eyelid inflammation. SYMPTOMS   Tenderness, redness and swelling along the edge of the eyelid at the base of the eyelashes.  Sometimes, there is a white or yellow head of pus. It may or may not drain. DIAGNOSIS  An ophthalmologist will be able to distinguish between a sty and a chalazion and treat the condition appropriately.  TREATMENT   Styes are typically treated with warm packs (compresses) until drainage occurs.  In rare cases, medicines that kill germs (antibiotics) may be prescribed. These antibiotics may be in the form of drops, cream or pills.  If a hard lump has formed, it is generally necessary to do a small incision and remove the hardened contents of the cyst in a minor surgical procedure done in the office.  In suspicious cases, your caregiver may send the contents of the cyst to the lab to be certain that it is not a rare, but dangerous form of cancer of the glands of the eyelid. HOME CARE INSTRUCTIONS   Wash your hands often and dry them with a clean towel. Avoid touching your eyelid. This may spread the infection to other parts of the eye.  Apply heat to your eyelid for 10 to 20 minutes, several times a day, to ease pain and help to heal it faster.  Do not squeeze the sty. Allow it to drain on its own. Wash your eyelid carefully 3 to 4 times per day to remove any pus. SEEK IMMEDIATE MEDICAL CARE IF:    Your eye becomes painful or puffy (swollen).  Your vision changes.  Your sty does not drain by itself within 3 days.  Your sty comes back within a short period of time, even with treatment.  You have redness (inflammation) around the eye.  You have a fever. Document Released: 04/23/2005 Document Revised: 10/06/2011 Document Reviewed: 12/26/2008 Exit[employer] Patient Information 2014 [employer].  
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
## 2013-12-02 NOTE — ED Notes (Signed)
Pt state that she started noticing swelling to her left eye a few days ago. Pt states that she went to bed and at 3 she woke up with swelling to her left eye. Pt does have swelling noted to her left eyelid.

## 2014-03-13 ENCOUNTER — Other Ambulatory Visit (HOSPITAL_COMMUNITY): Payer: Self-pay | Admitting: Obstetrics

## 2014-03-13 DIAGNOSIS — Z1231 Encounter for screening mammogram for malignant neoplasm of breast: Secondary | ICD-10-CM

## 2014-03-14 ENCOUNTER — Ambulatory Visit (HOSPITAL_COMMUNITY)
Admission: RE | Admit: 2014-03-14 | Discharge: 2014-03-14 | Disposition: A | Discharge status: Home / Self Care | Payer: BC Managed Care – PPO | Source: Ambulatory Visit | Attending: Obstetrics | Admitting: Obstetrics

## 2014-03-14 DIAGNOSIS — Z1231 Encounter for screening mammogram for malignant neoplasm of breast: Secondary | ICD-10-CM

## 2014-04-10 LAB — CYTOLOGY - PAP: Pap: NEGATIVE

## 2014-05-04 ENCOUNTER — Ambulatory Visit (INDEPENDENT_AMBULATORY_CARE_PROVIDER_SITE_OTHER)
Admission: RE | Admit: 2014-05-04 | Discharge: 2014-05-04 | Disposition: A | Discharge status: Home / Self Care | Payer: BC Managed Care – PPO | Source: Ambulatory Visit | Attending: Pulmonary Disease | Admitting: Pulmonary Disease

## 2014-05-04 ENCOUNTER — Encounter: Payer: Self-pay | Admitting: Pulmonary Disease

## 2014-05-04 ENCOUNTER — Ambulatory Visit (INDEPENDENT_AMBULATORY_CARE_PROVIDER_SITE_OTHER): Payer: BC Managed Care – PPO | Admitting: Pulmonary Disease

## 2014-05-04 ENCOUNTER — Other Ambulatory Visit (INDEPENDENT_AMBULATORY_CARE_PROVIDER_SITE_OTHER): Payer: BC Managed Care – PPO

## 2014-05-04 VITALS — BP 112/80 | HR 62 | Temp 98.3°F | Ht 67.0 in | Wt 209.8 lb

## 2014-05-04 DIAGNOSIS — I10 Essential (primary) hypertension: Secondary | ICD-10-CM

## 2014-05-04 DIAGNOSIS — Z0000 Encounter for general adult medical examination without abnormal findings: Secondary | ICD-10-CM

## 2014-05-04 DIAGNOSIS — M159 Polyosteoarthritis, unspecified: Secondary | ICD-10-CM

## 2014-05-04 DIAGNOSIS — E663 Overweight: Secondary | ICD-10-CM

## 2014-05-04 DIAGNOSIS — K5730 Diverticulosis of large intestine without perforation or abscess without bleeding: Secondary | ICD-10-CM

## 2014-05-04 DIAGNOSIS — I872 Venous insufficiency (chronic) (peripheral): Secondary | ICD-10-CM

## 2014-05-04 DIAGNOSIS — M5441 Lumbago with sciatica, right side: Secondary | ICD-10-CM

## 2014-05-04 DIAGNOSIS — F411 Generalized anxiety disorder: Secondary | ICD-10-CM

## 2014-05-04 DIAGNOSIS — M150 Primary generalized (osteo)arthritis: Secondary | ICD-10-CM

## 2014-05-04 LAB — LIPID PANEL
CHOLESTEROL: 158 mg/dL (ref 0–200)
HDL: 39.1 mg/dL (ref >39.00)
LDL Cholesterol: 104 mg/dL — ABNORMAL HIGH (ref 0–99)
NonHDL: 118.9
TRIGLYCERIDES: 75 mg/dL (ref 0.0–149.0)
Total CHOL/HDL Ratio: 4
VLDL: 15 mg/dL (ref 0.0–40.0)

## 2014-05-04 LAB — CBC WITH DIFFERENTIAL/PLATELET
Basophils Absolute: 0 10*3/uL (ref 0.0–0.1)
Basophils Relative: 0.5 % (ref 0.0–3.0)
EOS PCT: 1.1 % (ref 0.0–5.0)
Eosinophils Absolute: 0 10*3/uL (ref 0.0–0.7)
HCT: 46.2 % — ABNORMAL HIGH (ref 36.0–46.0)
Hemoglobin: 14.8 g/dL (ref 12.0–15.0)
Lymphocytes Relative: 49.1 % — ABNORMAL HIGH (ref 12.0–46.0)
Lymphs Abs: 2.1 10*3/uL (ref 0.7–4.0)
MCHC: 32.1 g/dL (ref 30.0–36.0)
MCV: 78.4 fl (ref 78.0–100.0)
MONOS PCT: 7.6 % (ref 3.0–12.0)
Monocytes Absolute: 0.3 10*3/uL (ref 0.1–1.0)
Neutro Abs: 1.8 10*3/uL (ref 1.4–7.7)
Neutrophils Relative %: 41.7 % — ABNORMAL LOW (ref 43.0–77.0)
Platelets: 237 10*3/uL (ref 150.0–400.0)
RBC: 5.89 Mil/uL — ABNORMAL HIGH (ref 3.87–5.11)
RDW: 15.8 % — ABNORMAL HIGH (ref 11.5–15.5)
WBC: 4.3 10*3/uL (ref 4.0–10.5)

## 2014-05-04 LAB — TSH: TSH: 0.58 u[IU]/mL (ref 0.35–4.50)

## 2014-05-04 LAB — HEPATIC FUNCTION PANEL
ALT: 22 U/L (ref 0–35)
AST: 18 U/L (ref 0–37)
Albumin: 3.4 g/dL — ABNORMAL LOW (ref 3.5–5.2)
Alkaline Phosphatase: 70 U/L (ref 39–117)
Bilirubin, Direct: 0.2 mg/dL (ref 0.0–0.3)
Total Bilirubin: 1.8 mg/dL — ABNORMAL HIGH (ref 0.2–1.2)
Total Protein: 7.7 g/dL (ref 6.0–8.3)

## 2014-05-04 LAB — BASIC METABOLIC PANEL
BUN: 12 mg/dL (ref 6–23)
CO2: 30 mEq/L (ref 19–32)
Calcium: 9.5 mg/dL (ref 8.4–10.5)
Chloride: 105 mEq/L (ref 96–112)
Creatinine, Ser: 0.9 mg/dL (ref 0.4–1.2)
GFR: 86.74 mL/min (ref >60.00)
GLUCOSE: 96 mg/dL (ref 70–99)
POTASSIUM: 3.6 meq/L (ref 3.5–5.1)
Sodium: 140 mEq/L (ref 135–145)

## 2014-05-04 LAB — VITAMIN D 25 HYDROXY (VIT D DEFICIENCY, FRACTURES): VITD: 28.71 ng/mL — AB (ref 30.00–100.00)

## 2014-05-04 MED ORDER — BISOPROLOL-HYDROCHLOROTHIAZIDE 5-6.25 MG PO TABS: 1.0000 | ORAL_TABLET | Freq: Every day | ORAL | Status: DC

## 2014-05-04 MED ORDER — AMLODIPINE BESYLATE 5 MG PO TABS: 5.0000 mg | ORAL_TABLET | Freq: Every day | ORAL | Status: DC

## 2014-05-04 NOTE — Patient Instructions (Signed)
Today we updated your med list in our EPIC system...    Continue your current medications the same...  Today we rechecked your CXR, EKG, & FASTING blood work...    We will contact you w/ the results when available...   Keep up the good work w/ diet, exercise & wt reduction strategy...  Call for any questions...  Let's plan a follow up visit in 8998yr, sooner if needed for problems.Marland Kitchen..Marland Kitchen
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
## 2014-09-25 ENCOUNTER — Telehealth: Payer: Self-pay | Admitting: Pulmonary Disease

## 2014-09-25 MED ORDER — PREDNISONE (PAK) 5 MG PO TABS: ORAL_TABLET | ORAL | Status: DC

## 2014-09-25 MED ORDER — AZITHROMYCIN 250 MG PO TABS: ORAL_TABLET | ORAL | Status: DC

## 2014-09-25 NOTE — Telephone Encounter (Signed)
Per SN- Call in zpak and pred taper 5mg - 6 day pack.   Spoke with pt, she is aware of recs.  Med sent to pharmacy.  Nothing further needed.

## 2014-09-25 NOTE — Telephone Encounter (Signed)
Spoke with pt, since Friday pt c/o stuffy and runny nose, sneezing, head congestion, pnd.  Denies fever, sob, cough.  Pt uses [facility] in [place].    Last ov:05/04/14 Next ov: 1-/10/16  Dr. Kriste BasqueNadel please advise.  Thanks!  No Known Allergies Current Outpatient Prescriptions on File Prior to Visit  Medication Sig Dispense Refill  . amLODipine (NORVASC) 5 MG tablet Take 1 tablet (5 mg total) by mouth daily. 90 tablet 3  . aspirin 81 MG tablet Take 81 mg by mouth daily.      . bisoprolol-hydrochlorothiazide (ZIAC) 5-6.25 MG per tablet Take 1 tablet by mouth daily. 90 tablet 3  . Multiple Vitamins-Minerals (WOMENS MULTIVITAMIN PLUS PO) Once a day      No current facility-administered medications on file prior to visit.

## 2015-02-26 ENCOUNTER — Other Ambulatory Visit: Payer: Self-pay | Admitting: Pulmonary Disease

## 2015-03-06 ENCOUNTER — Other Ambulatory Visit (HOSPITAL_COMMUNITY): Payer: Self-pay | Admitting: Internal Medicine

## 2015-03-06 DIAGNOSIS — Z1231 Encounter for screening mammogram for malignant neoplasm of breast: Secondary | ICD-10-CM

## 2015-03-28 ENCOUNTER — Ambulatory Visit (HOSPITAL_COMMUNITY)
Admission: RE | Admit: 2015-03-28 | Discharge: 2015-03-28 | Disposition: A | Discharge status: Home / Self Care | Payer: 59 | Source: Ambulatory Visit | Attending: Internal Medicine | Admitting: Internal Medicine

## 2015-03-28 DIAGNOSIS — Z1231 Encounter for screening mammogram for malignant neoplasm of breast: Secondary | ICD-10-CM | POA: Diagnosis not present

## 2015-05-07 ENCOUNTER — Ambulatory Visit: Payer: BC Managed Care – PPO | Admitting: Pulmonary Disease

## 2015-07-31 ENCOUNTER — Other Ambulatory Visit: Payer: Self-pay | Admitting: Pulmonary Disease

## 2015-12-21 DIAGNOSIS — M1711 Unilateral primary osteoarthritis, right knee: Secondary | ICD-10-CM | POA: Diagnosis not present

## 2015-12-21 DIAGNOSIS — M5441 Lumbago with sciatica, right side: Secondary | ICD-10-CM | POA: Diagnosis not present

## 2016-01-02 ENCOUNTER — Other Ambulatory Visit: Payer: Self-pay | Admitting: Orthopedic Surgery

## 2016-01-02 DIAGNOSIS — M545 Low back pain: Secondary | ICD-10-CM

## 2016-01-06 ENCOUNTER — Ambulatory Visit
Admission: RE | Admit: 2016-01-06 | Discharge: 2016-01-06 | Disposition: A | Discharge status: Home / Self Care | Payer: Self-pay | Source: Ambulatory Visit | Attending: Orthopedic Surgery | Admitting: Orthopedic Surgery

## 2016-01-06 DIAGNOSIS — M5126 Other intervertebral disc displacement, lumbar region: Secondary | ICD-10-CM | POA: Diagnosis not present

## 2016-01-06 DIAGNOSIS — M545 Low back pain: Secondary | ICD-10-CM

## 2016-01-14 DIAGNOSIS — N390 Urinary tract infection, site not specified: Secondary | ICD-10-CM | POA: Diagnosis not present

## 2016-01-23 DIAGNOSIS — M4806 Spinal stenosis, lumbar region: Secondary | ICD-10-CM | POA: Diagnosis not present

## 2016-01-23 DIAGNOSIS — M4316 Spondylolisthesis, lumbar region: Secondary | ICD-10-CM | POA: Diagnosis not present

## 2016-01-23 DIAGNOSIS — M5416 Radiculopathy, lumbar region: Secondary | ICD-10-CM | POA: Diagnosis not present

## 2016-02-06 DIAGNOSIS — M4806 Spinal stenosis, lumbar region: Secondary | ICD-10-CM | POA: Diagnosis not present

## 2016-02-06 DIAGNOSIS — M4316 Spondylolisthesis, lumbar region: Secondary | ICD-10-CM | POA: Diagnosis not present

## 2016-02-06 DIAGNOSIS — M5416 Radiculopathy, lumbar region: Secondary | ICD-10-CM | POA: Diagnosis not present

## 2016-03-03 DIAGNOSIS — M5416 Radiculopathy, lumbar region: Secondary | ICD-10-CM | POA: Diagnosis not present

## 2016-03-03 DIAGNOSIS — M4316 Spondylolisthesis, lumbar region: Secondary | ICD-10-CM | POA: Diagnosis not present

## 2016-03-03 DIAGNOSIS — M4806 Spinal stenosis, lumbar region: Secondary | ICD-10-CM | POA: Diagnosis not present

## 2016-03-14 ENCOUNTER — Other Ambulatory Visit: Payer: Self-pay | Admitting: Internal Medicine

## 2016-03-14 DIAGNOSIS — Z1231 Encounter for screening mammogram for malignant neoplasm of breast: Secondary | ICD-10-CM

## 2016-03-26 DIAGNOSIS — M47816 Spondylosis without myelopathy or radiculopathy, lumbar region: Secondary | ICD-10-CM | POA: Diagnosis not present

## 2016-03-26 DIAGNOSIS — M4804 Spinal stenosis, thoracic region: Secondary | ICD-10-CM | POA: Diagnosis not present

## 2016-03-28 ENCOUNTER — Ambulatory Visit: Payer: Self-pay

## 2016-04-08 DIAGNOSIS — N390 Urinary tract infection, site not specified: Secondary | ICD-10-CM | POA: Diagnosis not present

## 2016-04-08 DIAGNOSIS — I10 Essential (primary) hypertension: Secondary | ICD-10-CM | POA: Diagnosis not present

## 2016-04-08 DIAGNOSIS — R3915 Urgency of urination: Secondary | ICD-10-CM | POA: Diagnosis not present

## 2016-04-08 DIAGNOSIS — J309 Allergic rhinitis, unspecified: Secondary | ICD-10-CM | POA: Diagnosis not present

## 2016-04-08 DIAGNOSIS — M549 Dorsalgia, unspecified: Secondary | ICD-10-CM | POA: Diagnosis not present

## 2016-04-08 DIAGNOSIS — Z79899 Other long term (current) drug therapy: Secondary | ICD-10-CM | POA: Diagnosis not present

## 2016-04-11 ENCOUNTER — Ambulatory Visit: Payer: Self-pay

## 2016-04-11 ENCOUNTER — Ambulatory Visit
Admission: RE | Admit: 2016-04-11 | Discharge: 2016-04-11 | Disposition: A | Discharge status: Home / Self Care | Payer: Medicare Other | Source: Ambulatory Visit | Attending: Internal Medicine | Admitting: Internal Medicine

## 2016-04-11 DIAGNOSIS — Z1231 Encounter for screening mammogram for malignant neoplasm of breast: Secondary | ICD-10-CM | POA: Diagnosis not present

## 2016-10-15 DIAGNOSIS — R209 Unspecified disturbances of skin sensation: Secondary | ICD-10-CM | POA: Diagnosis not present

## 2016-10-15 DIAGNOSIS — N950 Postmenopausal bleeding: Secondary | ICD-10-CM | POA: Diagnosis not present

## 2016-10-15 DIAGNOSIS — Z79899 Other long term (current) drug therapy: Secondary | ICD-10-CM | POA: Diagnosis not present

## 2016-10-15 DIAGNOSIS — M5136 Other intervertebral disc degeneration, lumbar region: Secondary | ICD-10-CM | POA: Diagnosis not present

## 2016-10-15 DIAGNOSIS — M1711 Unilateral primary osteoarthritis, right knee: Secondary | ICD-10-CM | POA: Diagnosis not present

## 2016-10-15 DIAGNOSIS — I10 Essential (primary) hypertension: Secondary | ICD-10-CM | POA: Diagnosis not present

## 2016-10-15 DIAGNOSIS — M25561 Pain in right knee: Secondary | ICD-10-CM | POA: Diagnosis not present

## 2016-11-10 DIAGNOSIS — Q762 Congenital spondylolisthesis: Secondary | ICD-10-CM | POA: Diagnosis not present

## 2016-11-10 DIAGNOSIS — M5410 Radiculopathy, site unspecified: Secondary | ICD-10-CM | POA: Diagnosis not present

## 2016-11-10 DIAGNOSIS — M545 Low back pain: Secondary | ICD-10-CM | POA: Diagnosis not present

## 2016-11-13 DIAGNOSIS — N390 Urinary tract infection, site not specified: Secondary | ICD-10-CM | POA: Diagnosis not present

## 2016-11-13 DIAGNOSIS — N950 Postmenopausal bleeding: Secondary | ICD-10-CM | POA: Diagnosis not present

## 2016-11-27 DIAGNOSIS — M5137 Other intervertebral disc degeneration, lumbosacral region: Secondary | ICD-10-CM | POA: Diagnosis not present

## 2016-11-27 DIAGNOSIS — M5410 Radiculopathy, site unspecified: Secondary | ICD-10-CM | POA: Diagnosis not present

## 2016-11-27 DIAGNOSIS — Q762 Congenital spondylolisthesis: Secondary | ICD-10-CM | POA: Diagnosis not present

## 2016-11-27 DIAGNOSIS — M545 Low back pain: Secondary | ICD-10-CM | POA: Diagnosis not present

## 2016-12-01 DIAGNOSIS — N840 Polyp of corpus uteri: Secondary | ICD-10-CM | POA: Diagnosis not present

## 2016-12-01 DIAGNOSIS — N950 Postmenopausal bleeding: Secondary | ICD-10-CM | POA: Diagnosis not present

## 2016-12-08 DIAGNOSIS — I10 Essential (primary) hypertension: Secondary | ICD-10-CM | POA: Diagnosis not present

## 2016-12-08 DIAGNOSIS — Z01818 Encounter for other preprocedural examination: Secondary | ICD-10-CM | POA: Diagnosis not present

## 2016-12-16 DIAGNOSIS — M5416 Radiculopathy, lumbar region: Secondary | ICD-10-CM | POA: Diagnosis not present

## 2016-12-17 DIAGNOSIS — N840 Polyp of corpus uteri: Secondary | ICD-10-CM | POA: Diagnosis not present

## 2016-12-17 DIAGNOSIS — N950 Postmenopausal bleeding: Secondary | ICD-10-CM | POA: Diagnosis not present

## 2017-01-04 ENCOUNTER — Other Ambulatory Visit (INDEPENDENT_AMBULATORY_CARE_PROVIDER_SITE_OTHER): Payer: Self-pay | Admitting: Physical Medicine and Rehabilitation

## 2017-01-06 DIAGNOSIS — N950 Postmenopausal bleeding: Secondary | ICD-10-CM | POA: Diagnosis not present

## 2017-01-06 DIAGNOSIS — N840 Polyp of corpus uteri: Secondary | ICD-10-CM | POA: Diagnosis not present

## 2017-01-12 NOTE — Telephone Encounter (Signed)
Please advise 

## 2017-01-13 DIAGNOSIS — M5416 Radiculopathy, lumbar region: Secondary | ICD-10-CM | POA: Diagnosis not present

## 2017-02-05 DIAGNOSIS — M545 Low back pain: Secondary | ICD-10-CM | POA: Diagnosis not present

## 2017-02-05 DIAGNOSIS — M5416 Radiculopathy, lumbar region: Secondary | ICD-10-CM | POA: Diagnosis not present

## 2017-02-05 DIAGNOSIS — M5137 Other intervertebral disc degeneration, lumbosacral region: Secondary | ICD-10-CM | POA: Diagnosis not present

## 2017-02-17 DIAGNOSIS — M25511 Pain in right shoulder: Secondary | ICD-10-CM | POA: Diagnosis not present

## 2017-02-18 DIAGNOSIS — N8500 Endometrial hyperplasia, unspecified: Secondary | ICD-10-CM | POA: Diagnosis not present

## 2017-02-23 DIAGNOSIS — M5416 Radiculopathy, lumbar region: Secondary | ICD-10-CM | POA: Diagnosis not present

## 2017-02-26 ENCOUNTER — Encounter (HOSPITAL_BASED_OUTPATIENT_CLINIC_OR_DEPARTMENT_OTHER): Payer: Self-pay | Admitting: *Deleted
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
## 2017-03-16 NOTE — Patient Instructions (Addendum)
Janice Hutchinson  03/16/2017      Your procedure is scheduled on 03-25-17  Report to [facility]  at  6A.M.  Call this number if you have problems the morning of surgery:[phone]  OUR ADDRESS IS [address], WE ARE LOCATED IN THE MEDICAL PLA[facility] WITH [facility].   Remember:  Do not eat food or drink liquids after midnight.  Take these medicines the morning of surgery with A SIP OF WATER: amlodipine(norvasc), metoprolol, tylenol as needed   Do not wear jewelry, make-up or nail polish.  Do not wear lotions, powders, or perfumes, or deoderant.  Do not shave 48 hours prior to surgery.  Men may shave face and neck.  Do not bring valuables to the hospital.  [facility] is not responsible for any belongings or valuables.  Contacts, dentures or bridgework may not be worn into surgery.  Leave your suitcase in the car.  After surgery it may be brought to your room.  For patients admitted to the hospital, discharge time will be determined by your treatment team.  Patients discharged the day of surgery will not be allowed to drive home.   Special instructions:  N/A  Please read over the following fact sheets that you were given:       [facility] - Preparing for Surgery Before surgery, you can play an important role.  Because skin is not sterile, your skin needs to be as free of germs as possible.  You can reduce the number of germs on your skin by washing with CHG (chlorahexidine gluconate) soap before surgery.  CHG is an antiseptic cleaner which kills germs and bonds with the skin to continue killing germs even after washing. Please DO NOT use if you have an allergy to CHG or antibacterial soaps.  If your skin becomes reddened/irritated stop using the CHG and inform your nurse when you arrive at Short Stay. Do not shave (including legs and underarms) for at least 48 hours prior to the first CHG shower.  You may shave your face/neck. Please follow these  instructions carefully:  1.  Shower with CHG Soap the night before surgery and the  morning of Surgery.  2.  If you choose to wash your hair, wash your hair first as usual with your  normal  shampoo.  3.  After you shampoo, rinse your hair and body thoroughly to remove the  shampoo.                           4.  Use CHG as you would any other liquid soap.  You can apply chg directly  to the skin and wash                       Gently with a scrungie or clean washcloth.  5.  Apply the CHG Soap to your body ONLY FROM THE NECK DOWN.   Do not use on face/ open                           Wound or open sores. Avoid contact with eyes, ears mouth and genitals (private parts).                       Wash face,  Genitals (private parts) with your normal soap.  6.  Wash thoroughly, paying special attention to the area where your surgery  will be performed.  7.  Thoroughly rinse your body with warm water from the neck down.  8.  DO NOT shower/wash with your normal soap after using and rinsing off  the CHG Soap.                9.  Pat yourself dry with a clean towel.            10.  Wear clean pajamas.            11.  Place clean sheets on your bed the night of your first shower and do not  sleep with pets. Day of Surgery : Do not apply any lotions/deodorants the morning of surgery.  Please wear clean clothes to the hospital/surgery center.  FAILURE TO FOLLOW THESE INSTRUCTIONS MAY RESULT IN THE CANCELLATION OF YOUR SURGERY PATIENT SIGNATURE_________________________________  NURSE SIGNATURE__________________________________  ________________________________________________________________________   Adam Phenix  An incentive spirometer is a tool that can help keep your lungs clear and active. This tool measures how well you are filling your lungs with each breath. Taking long deep breaths may help reverse or decrease the chance of developing breathing (pulmonary) problems (especially  infection) following:  A long period of time when you are unable to move or be active. BEFORE THE PROCEDURE   If the spirometer includes an indicator to show your best effort, your nurse or respiratory therapist will set it to a desired goal.  If possible, sit up straight or lean slightly forward. Try not to slouch.  Hold the incentive spirometer in an upright position. INSTRUCTIONS FOR USE  1. Sit on the edge of your bed if possible, or sit up as far as you can in bed or on a chair. 2. Hold the incentive spirometer in an upright position. 3. Breathe out normally. 4. Place the mouthpiece in your mouth and seal your lips tightly around it. 5. Breathe in slowly and as deeply as possible, raising the piston or the ball toward the top of the column. 6. Hold your breath for 3-5 seconds or for as long as possible. Allow the piston or ball to fall to the bottom of the column. 7. Remove the mouthpiece from your mouth and breathe out normally. 8. Rest for a few seconds and repeat Steps 1 through 7 at least 10 times every 1-2 hours when you are awake. Take your time and take a few normal breaths between deep breaths. 9. The spirometer may include an indicator to show your best effort. Use the indicator as a goal to work toward during each repetition. 10. After each set of 10 deep breaths, practice coughing to be sure your lungs are clear. If you have an incision (the cut made at the time of surgery), support your incision when coughing by placing a pillow or rolled up towels firmly against it. Once you are able to get out of bed, walk around indoors and cough well. You may stop using the incentive spirometer when instructed by your caregiver.  RISKS AND COMPLICATIONS  Take your time so you do not get dizzy or light-headed.  If you are in pain, you may need to take or ask for pain medication before doing incentive spirometry. It is harder to take a deep breath if you are having pain. AFTER  USE  Rest and breathe slowly and easily.  It can be helpful to keep track of a log of  your progress. Your caregiver can provide you with a simple table to help with this. If you are using the spirometer at home, follow these instructions: [place] IF:   You are having difficultly using the spirometer.  You have trouble using the spirometer as often as instructed.  Your pain medication is not giving enough relief while using the spirometer.  You develop fever of 100.5 F (38.1 C) or higher. SEEK IMMEDIATE MEDICAL CARE IF:   You cough up bloody sputum that had not been present before.  You develop fever of 102 F (38.9 C) or greater.  You develop worsening pain at or near the incision site. MAKE SURE YOU:   Understand these instructions.  Will watch your condition.  Will get help right away if you are not doing well or get worse. Document Released: 11/24/2006 Document Revised: 10/06/2011 Document Reviewed: 01/25/2007 [facility] Patient Information 2014 [place], [place].   ________________________________________________________________________

## 2017-03-16 NOTE — Progress Notes (Addendum)
EKG 12-08-16 on chart   LOV pulmonology Dr Kriste Basque 05-04-17 epic  CHEST PAIN, HX OF (ICD-V12.50) - no  recurrence... takes ASA 81mg /d...  ~  NuclearStressTest 11/05 showed no infarct or  ischemia, norm wall motion, EF=66%...  ~  CXR 7/12 showed norm heart size, clear lungs,  WNL, NAD...   ~  Baseline EKG shows NSR, WNL

## 2017-03-17 ENCOUNTER — Encounter (HOSPITAL_COMMUNITY)
Admission: RE | Admit: 2017-03-17 | Discharge: 2017-03-17 | Disposition: A | Discharge status: Home / Self Care | Payer: Medicare Other | Source: Ambulatory Visit | Attending: Obstetrics and Gynecology | Admitting: Obstetrics and Gynecology

## 2017-03-17 ENCOUNTER — Encounter (HOSPITAL_COMMUNITY): Payer: Self-pay

## 2017-03-17 DIAGNOSIS — Z01810 Encounter for preprocedural cardiovascular examination: Secondary | ICD-10-CM | POA: Insufficient documentation

## 2017-03-17 DIAGNOSIS — Z01812 Encounter for preprocedural laboratory examination: Secondary | ICD-10-CM | POA: Insufficient documentation

## 2017-03-17 HISTORY — DX: Family history of other specified conditions: Z84.89

## 2017-03-17 LAB — BASIC METABOLIC PANEL
ANION GAP: 6 (ref 5–15)
BUN: 11 mg/dL (ref 6–20)
CALCIUM: 9.9 mg/dL (ref 8.9–10.3)
CO2: 29 mmol/L (ref 22–32)
Chloride: 105 mmol/L (ref 101–111)
Creatinine, Ser: 0.9 mg/dL (ref 0.44–1.00)
Glucose, Bld: 92 mg/dL (ref 65–99)
POTASSIUM: 4.3 mmol/L (ref 3.5–5.1)
Sodium: 140 mmol/L (ref 135–145)

## 2017-03-17 LAB — CBC
HCT: 45.9 % (ref 36.0–46.0)
HEMOGLOBIN: 15.6 g/dL — AB (ref 12.0–15.0)
MCH: 25.9 pg — ABNORMAL LOW (ref 26.0–34.0)
MCHC: 34 g/dL (ref 30.0–36.0)
MCV: 76.2 fL — ABNORMAL LOW (ref 78.0–100.0)
Platelets: 197 10*3/uL (ref 150–400)
RBC: 6.02 MIL/uL — AB (ref 3.87–5.11)
RDW: 15.5 % (ref 11.5–15.5)
WBC: 4 10*3/uL (ref 4.0–10.5)

## 2017-03-17 LAB — PREGNANCY, URINE: PREG TEST UR: NEGATIVE

## 2017-03-17 LAB — ABO/RH: ABO/RH(D): B POS

## 2017-03-20 NOTE — Progress Notes (Signed)
CALLED AND SPOKE W/ PT VIA PHONE.  PT VERBALIZED UNDERSTANDING TO ARRIVE AT 0530 , NOT 0600, DUE TO NEED EXTRA PRE-OP TIME.

## 2017-03-25 ENCOUNTER — Ambulatory Visit (HOSPITAL_BASED_OUTPATIENT_CLINIC_OR_DEPARTMENT_OTHER): Payer: Medicare Other | Admitting: Anesthesiology

## 2017-03-25 ENCOUNTER — Encounter (HOSPITAL_COMMUNITY)
Admission: AD | Disposition: A | Discharge status: Home / Self Care | Payer: Self-pay | Source: Ambulatory Visit | Attending: Obstetrics and Gynecology

## 2017-03-25 ENCOUNTER — Encounter (HOSPITAL_BASED_OUTPATIENT_CLINIC_OR_DEPARTMENT_OTHER): Payer: Self-pay | Admitting: *Deleted

## 2017-03-25 ENCOUNTER — Inpatient Hospital Stay (HOSPITAL_BASED_OUTPATIENT_CLINIC_OR_DEPARTMENT_OTHER)
Admission: AD | Admit: 2017-03-25 | Discharge: 2017-03-27 | DRG: 743 | Disposition: A | Discharge status: Home / Self Care | Payer: Medicare Other | Source: Ambulatory Visit | Attending: Obstetrics and Gynecology | Admitting: Obstetrics and Gynecology

## 2017-03-25 DIAGNOSIS — I341 Nonrheumatic mitral (valve) prolapse: Secondary | ICD-10-CM | POA: Diagnosis present

## 2017-03-25 DIAGNOSIS — K660 Peritoneal adhesions (postprocedural) (postinfection): Secondary | ICD-10-CM | POA: Diagnosis present

## 2017-03-25 DIAGNOSIS — K5730 Diverticulosis of large intestine without perforation or abscess without bleeding: Secondary | ICD-10-CM | POA: Diagnosis present

## 2017-03-25 DIAGNOSIS — Z6831 Body mass index (BMI) 31.0-31.9, adult: Secondary | ICD-10-CM | POA: Diagnosis not present

## 2017-03-25 DIAGNOSIS — D573 Sickle-cell trait: Secondary | ICD-10-CM | POA: Diagnosis present

## 2017-03-25 DIAGNOSIS — Z7982 Long term (current) use of aspirin: Secondary | ICD-10-CM

## 2017-03-25 DIAGNOSIS — I10 Essential (primary) hypertension: Secondary | ICD-10-CM | POA: Diagnosis present

## 2017-03-25 DIAGNOSIS — N736 Female pelvic peritoneal adhesions (postinfective): Secondary | ICD-10-CM | POA: Diagnosis not present

## 2017-03-25 DIAGNOSIS — Z800 Family history of malignant neoplasm of digestive organs: Secondary | ICD-10-CM

## 2017-03-25 DIAGNOSIS — N950 Postmenopausal bleeding: Secondary | ICD-10-CM | POA: Diagnosis not present

## 2017-03-25 DIAGNOSIS — Z833 Family history of diabetes mellitus: Secondary | ICD-10-CM

## 2017-03-25 DIAGNOSIS — M797 Fibromyalgia: Secondary | ICD-10-CM | POA: Diagnosis present

## 2017-03-25 DIAGNOSIS — Z841 Family history of disorders of kidney and ureter: Secondary | ICD-10-CM | POA: Diagnosis not present

## 2017-03-25 DIAGNOSIS — N8502 Endometrial intraepithelial neoplasia [EIN]: Principal | ICD-10-CM | POA: Diagnosis present

## 2017-03-25 DIAGNOSIS — E663 Overweight: Secondary | ICD-10-CM | POA: Diagnosis present

## 2017-03-25 DIAGNOSIS — Z90710 Acquired absence of both cervix and uterus: Secondary | ICD-10-CM | POA: Diagnosis present

## 2017-03-25 DIAGNOSIS — M545 Low back pain: Secondary | ICD-10-CM | POA: Diagnosis present

## 2017-03-25 HISTORY — PX: CYSTOSCOPY: SHX5120

## 2017-03-25 HISTORY — PX: LAPAROSCOPIC VAGINAL HYSTERECTOMY WITH SALPINGO OOPHORECTOMY: SHX6681

## 2017-03-25 LAB — TYPE AND SCREEN
ABO/RH(D): B POS
ANTIBODY SCREEN: NEGATIVE

## 2017-03-25 SURGERY — HYSTERECTOMY, VAGINAL, LAPAROSCOPY-ASSISTED, WITH SALPINGO-OOPHORECTOMY
Anesthesia: General | Site: Bladder | Laterality: Bilateral

## 2017-03-25 MED ORDER — SODIUM CHLORIDE 0.9 % IR SOLN
Status: DC | PRN
  Administered 2017-03-25: 3000 mL

## 2017-03-25 MED ORDER — DOCUSATE SODIUM 100 MG PO CAPS
100.0000 mg | ORAL_CAPSULE | Freq: Two times a day (BID) | ORAL | Status: DC
  Administered 2017-03-25 – 2017-03-26 (×3): 100 mg via ORAL
  Filled 2017-03-25 (×4): qty 1

## 2017-03-25 MED ORDER — PROMETHAZINE HCL 25 MG/ML IJ SOLN
6.2500 mg | INTRAMUSCULAR | Status: DC | PRN
  Filled 2017-03-25: qty 1

## 2017-03-25 MED ORDER — MEPERIDINE HCL 25 MG/ML IJ SOLN
6.2500 mg | INTRAMUSCULAR | Status: DC | PRN
  Filled 2017-03-25: qty 1

## 2017-03-25 MED ORDER — HEMOSTATIC AGENTS (NO CHARGE) OPTIME
TOPICAL | Status: DC | PRN
  Administered 2017-03-25: 1 via TOPICAL

## 2017-03-25 MED ORDER — ACETAMINOPHEN 500 MG PO TABS
1000.0000 mg | ORAL_TABLET | Freq: Four times a day (QID) | ORAL | Status: DC
  Administered 2017-03-25 – 2017-03-27 (×4): 1000 mg via ORAL
  Filled 2017-03-25 (×4): qty 2

## 2017-03-25 MED ORDER — METOPROLOL TARTRATE 25 MG PO TABS
25.0000 mg | ORAL_TABLET | Freq: Two times a day (BID) | ORAL | Status: DC
  Administered 2017-03-25 – 2017-03-27 (×4): 25 mg via ORAL
  Filled 2017-03-25 (×4): qty 1

## 2017-03-25 MED ORDER — MIDAZOLAM HCL 5 MG/5ML IJ SOLN
INTRAMUSCULAR | Status: DC | PRN
  Administered 2017-03-25: 2 mg via INTRAVENOUS

## 2017-03-25 MED ORDER — SIMETHICONE 80 MG PO CHEW: 80.0000 mg | CHEWABLE_TABLET | Freq: Four times a day (QID) | ORAL | Status: DC | PRN

## 2017-03-25 MED ORDER — DEXAMETHASONE SODIUM PHOSPHATE 10 MG/ML IJ SOLN
INTRAMUSCULAR | Status: AC
  Filled 2017-03-25: qty 1

## 2017-03-25 MED ORDER — LIDOCAINE 2% (20 MG/ML) 5 ML SYRINGE
INTRAMUSCULAR | Status: AC
Start: 2017-03-25 — End: ?
  Filled 2017-03-25: qty 5

## 2017-03-25 MED ORDER — AMLODIPINE BESYLATE 5 MG PO TABS
5.0000 mg | ORAL_TABLET | Freq: Every day | ORAL | Status: DC
  Administered 2017-03-26 – 2017-03-27 (×2): 5 mg via ORAL
  Filled 2017-03-25 (×2): qty 1

## 2017-03-25 MED ORDER — FENTANYL CITRATE (PF) 100 MCG/2ML IJ SOLN
INTRAMUSCULAR | Status: AC
  Filled 2017-03-25: qty 2

## 2017-03-25 MED ORDER — ONDANSETRON HCL 4 MG/2ML IJ SOLN
INTRAMUSCULAR | Status: DC | PRN
  Administered 2017-03-25: 4 mg via INTRAVENOUS

## 2017-03-25 MED ORDER — ROCURONIUM BROMIDE 50 MG/5ML IV SOSY
PREFILLED_SYRINGE | INTRAVENOUS | Status: DC | PRN
  Administered 2017-03-25: 10 mg via INTRAVENOUS
  Administered 2017-03-25: 50 mg via INTRAVENOUS

## 2017-03-25 MED ORDER — ONDANSETRON HCL 4 MG/2ML IJ SOLN
4.0000 mg | Freq: Four times a day (QID) | INTRAMUSCULAR | Status: DC | PRN
Start: 2017-03-25 — End: 2017-03-27

## 2017-03-25 MED ORDER — KETOROLAC TROMETHAMINE 30 MG/ML IJ SOLN
INTRAMUSCULAR | Status: AC
  Filled 2017-03-25: qty 1

## 2017-03-25 MED ORDER — HYDROMORPHONE HCL-NACL 0.5-0.9 MG/ML-% IV SOSY: 0.2000 mg | PREFILLED_SYRINGE | INTRAVENOUS | Status: DC | PRN

## 2017-03-25 MED ORDER — CEFAZOLIN SODIUM-DEXTROSE 2-4 GM/100ML-% IV SOLN
INTRAVENOUS | Status: AC
  Filled 2017-03-25: qty 100

## 2017-03-25 MED ORDER — FENTANYL CITRATE (PF) 100 MCG/2ML IJ SOLN
INTRAMUSCULAR | Status: DC | PRN
  Administered 2017-03-25: 50 ug via INTRAVENOUS
  Administered 2017-03-25: 25 ug via INTRAVENOUS
  Administered 2017-03-25: 50 ug via INTRAVENOUS
  Administered 2017-03-25 (×2): 25 ug via INTRAVENOUS

## 2017-03-25 MED ORDER — STERILE WATER FOR IRRIGATION IR SOLN
Status: DC | PRN
  Administered 2017-03-25: 3000 mL

## 2017-03-25 MED ORDER — CEFAZOLIN SODIUM-DEXTROSE 2-4 GM/100ML-% IV SOLN
2.0000 g | INTRAVENOUS | Status: AC
  Administered 2017-03-25: 2 g via INTRAVENOUS
  Filled 2017-03-25: qty 100

## 2017-03-25 MED ORDER — PANTOPRAZOLE SODIUM 40 MG PO TBEC
40.0000 mg | DELAYED_RELEASE_TABLET | Freq: Every day | ORAL | Status: DC
  Administered 2017-03-25 – 2017-03-27 (×3): 40 mg via ORAL
  Filled 2017-03-25 (×3): qty 1

## 2017-03-25 MED ORDER — DEXAMETHASONE SODIUM PHOSPHATE 4 MG/ML IJ SOLN
INTRAMUSCULAR | Status: DC | PRN
  Administered 2017-03-25: 10 mg via INTRAVENOUS

## 2017-03-25 MED ORDER — ROCURONIUM BROMIDE 50 MG/5ML IV SOSY
PREFILLED_SYRINGE | INTRAVENOUS | Status: AC
  Filled 2017-03-25: qty 5

## 2017-03-25 MED ORDER — BISOPROLOL-HYDROCHLOROTHIAZIDE 5-6.25 MG PO TABS
1.0000 | ORAL_TABLET | Freq: Every day | ORAL | Status: DC
  Administered 2017-03-26 – 2017-03-27 (×2): 1 via ORAL
  Filled 2017-03-25 (×2): qty 1

## 2017-03-25 MED ORDER — HYDROMORPHONE HCL 1 MG/ML IJ SOLN
0.2500 mg | INTRAMUSCULAR | Status: DC | PRN
  Administered 2017-03-25: 0.5 mg via INTRAVENOUS
  Filled 2017-03-25: qty 0.5

## 2017-03-25 MED ORDER — CEFAZOLIN SODIUM-DEXTROSE 2-4 GM/100ML-% IV SOLN
2.0000 g | INTRAVENOUS | Status: DC
  Filled 2017-03-25: qty 100

## 2017-03-25 MED ORDER — OXYCODONE HCL 5 MG PO TABS
5.0000 mg | ORAL_TABLET | Freq: Once | ORAL | Status: DC | PRN
  Filled 2017-03-25: qty 1

## 2017-03-25 MED ORDER — ALUM & MAG HYDROXIDE-SIMETH 200-200-20 MG/5ML PO SUSP: 30.0000 mL | ORAL | Status: DC | PRN

## 2017-03-25 MED ORDER — OXYCODONE HCL 5 MG PO TABS: 5.0000 mg | ORAL_TABLET | ORAL | Status: DC | PRN

## 2017-03-25 MED ORDER — PROPOFOL 10 MG/ML IV BOLUS
INTRAVENOUS | Status: DC | PRN
  Administered 2017-03-25: 150 mg via INTRAVENOUS

## 2017-03-25 MED ORDER — HYDROMORPHONE HCL-NACL 0.5-0.9 MG/ML-% IV SOSY
PREFILLED_SYRINGE | INTRAVENOUS | Status: AC
  Filled 2017-03-25: qty 1

## 2017-03-25 MED ORDER — SENNOSIDES-DOCUSATE SODIUM 8.6-50 MG PO TABS: 1.0000 | ORAL_TABLET | Freq: Every evening | ORAL | Status: DC | PRN

## 2017-03-25 MED ORDER — DEXTROSE-NACL 5-0.45 % IV SOLN
125.0000 mL/h | INTRAVENOUS | Status: AC
  Administered 2017-03-25 – 2017-03-26 (×2): 125 mL/h via INTRAVENOUS

## 2017-03-25 MED ORDER — LIDOCAINE 2% (20 MG/ML) 5 ML SYRINGE
INTRAMUSCULAR | Status: DC | PRN
  Administered 2017-03-25: 60 mg via INTRAVENOUS

## 2017-03-25 MED ORDER — OXYCODONE HCL 5 MG/5ML PO SOLN
5.0000 mg | Freq: Once | ORAL | Status: DC | PRN
  Filled 2017-03-25: qty 5

## 2017-03-25 MED ORDER — CEFAZOLIN SODIUM 10 G IJ SOLR
3.0000 g | INTRAMUSCULAR | Status: DC
  Filled 2017-03-25: qty 3000

## 2017-03-25 MED ORDER — PROPOFOL 10 MG/ML IV BOLUS
INTRAVENOUS | Status: AC
  Filled 2017-03-25: qty 20

## 2017-03-25 MED ORDER — BUPIVACAINE HCL (PF) 0.25 % IJ SOLN
INTRAMUSCULAR | Status: DC | PRN
  Administered 2017-03-25: 5 mL

## 2017-03-25 MED ORDER — ONDANSETRON HCL 4 MG PO TABS: 4.0000 mg | ORAL_TABLET | Freq: Four times a day (QID) | ORAL | Status: DC | PRN

## 2017-03-25 MED ORDER — KETOROLAC TROMETHAMINE 30 MG/ML IJ SOLN
15.0000 mg | Freq: Four times a day (QID) | INTRAMUSCULAR | Status: DC
  Administered 2017-03-25 – 2017-03-26 (×3): 15 mg via INTRAVENOUS
  Filled 2017-03-25 (×2): qty 1

## 2017-03-25 MED ORDER — EPHEDRINE SULFATE-NACL 50-0.9 MG/10ML-% IV SOSY
PREFILLED_SYRINGE | INTRAVENOUS | Status: DC | PRN
  Administered 2017-03-25: 10 mg via INTRAVENOUS

## 2017-03-25 MED ORDER — KETOROLAC TROMETHAMINE 30 MG/ML IJ SOLN
30.0000 mg | Freq: Once | INTRAMUSCULAR | Status: DC | PRN
  Filled 2017-03-25: qty 1

## 2017-03-25 MED ORDER — KETOROLAC TROMETHAMINE 30 MG/ML IJ SOLN
15.0000 mg | Freq: Four times a day (QID) | INTRAMUSCULAR | Status: DC
  Filled 2017-03-25: qty 1

## 2017-03-25 MED ORDER — LACTATED RINGERS IV SOLN
INTRAVENOUS | Status: DC
  Administered 2017-03-25 (×2): via INTRAVENOUS
  Filled 2017-03-25: qty 1000

## 2017-03-25 MED ORDER — ONDANSETRON HCL 4 MG/2ML IJ SOLN
INTRAMUSCULAR | Status: AC
  Filled 2017-03-25: qty 2

## 2017-03-25 MED ORDER — MIDAZOLAM HCL 2 MG/2ML IJ SOLN
INTRAMUSCULAR | Status: AC
  Filled 2017-03-25: qty 2

## 2017-03-25 MED ORDER — SUGAMMADEX SODIUM 200 MG/2ML IV SOLN
INTRAVENOUS | Status: AC
  Filled 2017-03-25: qty 2

## 2017-03-25 MED ORDER — SUGAMMADEX SODIUM 200 MG/2ML IV SOLN
INTRAVENOUS | Status: DC | PRN
  Administered 2017-03-25: 200 mg via INTRAVENOUS

## 2017-03-25 SURGICAL SUPPLY — 73 items
ATTRACTOMAT 16X20 MAGNETIC DRP (DRAPES) ×4 IMPLANT
BENZOIN TINCTURE PRP APPL 2/3 (GAUZE/BANDAGES/DRESSINGS) ×4 IMPLANT
BLADE CLIPPER SURG (BLADE) IMPLANT
CABLE HIGH FREQUENCY MONO STRZ (ELECTRODE) IMPLANT
CANISTER SUCT 3000ML PPV (MISCELLANEOUS) ×4 IMPLANT
CLOSURE WOUND 1/2 X4 (GAUZE/BANDAGES/DRESSINGS) ×1
CLOTH BEACON ORANGE TIMEOUT ST (SAFETY) ×4 IMPLANT
COVER BACK TABLE 60X90IN (DRAPES) ×4 IMPLANT
COVER MAYO STAND STRL (DRAPES) ×8 IMPLANT
DECANTER SPIKE VIAL GLASS SM (MISCELLANEOUS) IMPLANT
DERMABOND ADVANCED (GAUZE/BANDAGES/DRESSINGS) ×2
DERMABOND ADVANCED .7 DNX12 (GAUZE/BANDAGES/DRESSINGS) ×2 IMPLANT
DISSECTOR ROUND CHERRY 3/8 STR (MISCELLANEOUS) ×4 IMPLANT
DRAPE WARM FLUID 44X44 (DRAPE) ×4 IMPLANT
DRSG OPSITE POSTOP 3X4 (GAUZE/BANDAGES/DRESSINGS) ×4 IMPLANT
DRSG OPSITE POSTOP 4X10 (GAUZE/BANDAGES/DRESSINGS) ×4 IMPLANT
DRSG TEGADERM 8X12 (GAUZE/BANDAGES/DRESSINGS) ×4 IMPLANT
DURAPREP 26ML APPLICATOR (WOUND CARE) ×4 IMPLANT
ELECT BLADE 6.5 .24CM SHAFT (ELECTRODE) ×4 IMPLANT
ELECT REM PT RETURN 9FT ADLT (ELECTROSURGICAL) ×4
ELECTRODE REM PT RTRN 9FT ADLT (ELECTROSURGICAL) ×2 IMPLANT
GAUZE SPONGE 4X4 12PLY STRL (GAUZE/BANDAGES/DRESSINGS) ×4 IMPLANT
GLOVE BIO SURGEON STRL SZ 6.5 (GLOVE) ×12 IMPLANT
GLOVE BIO SURGEONS STRL SZ 6.5 (GLOVE) ×4
GLOVE BIOGEL PI IND STRL 6.5 (GLOVE) ×6 IMPLANT
GLOVE BIOGEL PI IND STRL 7.5 (GLOVE) ×6 IMPLANT
GLOVE BIOGEL PI INDICATOR 6.5 (GLOVE) ×6
GLOVE BIOGEL PI INDICATOR 7.5 (GLOVE) ×6
GLOVE ECLIPSE 6.5 STRL STRAW (GLOVE) ×8 IMPLANT
GOWN STRL REUS W/TWL LRG LVL3 (GOWN DISPOSABLE) ×12 IMPLANT
HOLDER FOLEY CATH W/STRAP (MISCELLANEOUS) ×4 IMPLANT
IV NS IRRIG 3000ML ARTHROMATIC (IV SOLUTION) ×4 IMPLANT
KIT RM TURNOVER CYSTO AR (KITS) ×4 IMPLANT
LEGGING LITHOTOMY PAIR STRL (DRAPES) ×4 IMPLANT
LIGASURE IMPACT 36 18CM CVD LR (INSTRUMENTS) ×4 IMPLANT
LIGASURE LAP L-HOOKWIRE 5 44CM (INSTRUMENTS) ×4 IMPLANT
NEEDLE INSUFFLATION 14GA 120MM (NEEDLE) ×4 IMPLANT
NEEDLE INSUFFLATION 14GA 150MM (NEEDLE) IMPLANT
NS IRRIG 500ML POUR BTL (IV SOLUTION) ×16 IMPLANT
PACK LAVH (CUSTOM PROCEDURE TRAY) ×4 IMPLANT
PACK ROBOTIC GOWN (GOWN DISPOSABLE) ×4 IMPLANT
PACK TRENDGUARD 450 HYBRID PRO (MISCELLANEOUS) IMPLANT
PACK TRENDGUARD 600 HYBRD PROC (MISCELLANEOUS) IMPLANT
PAD OB MATERNITY 4.3X12.25 (PERSONAL CARE ITEMS) ×4 IMPLANT
PAD POSITIONING PINK XL (MISCELLANEOUS) ×4 IMPLANT
PAD PREP 24X48 CUFFED NSTRL (MISCELLANEOUS) ×4 IMPLANT
POUCH SPECIMEN RETRIEVAL 10MM (ENDOMECHANICALS) IMPLANT
SET IRRIG TUBING LAPAROSCOPIC (IRRIGATION / IRRIGATOR) IMPLANT
SPONGE LAP 18X18 X RAY DECT (DISPOSABLE) ×12 IMPLANT
STRIP CLOSURE SKIN 1/2X4 (GAUZE/BANDAGES/DRESSINGS) ×3 IMPLANT
SUT PROLENE 1 CT (SUTURE) ×4 IMPLANT
SUT VIC AB 0 CT1 18XCR BRD8 (SUTURE) ×2 IMPLANT
SUT VIC AB 0 CT1 27 (SUTURE) ×2
SUT VIC AB 0 CT1 27XCR 8 STRN (SUTURE) ×2 IMPLANT
SUT VIC AB 0 CT1 36 (SUTURE) ×4 IMPLANT
SUT VIC AB 0 CT1 8-18 (SUTURE) ×2
SUT VIC AB 2-0 CT1 (SUTURE) IMPLANT
SUT VIC AB 2-0 SH 18 (SUTURE) IMPLANT
SUT VIC AB 2-0 UR6 27 (SUTURE) IMPLANT
SUT VICRYL 0 TIES 12 18 (SUTURE) ×4 IMPLANT
SUT VICRYL 0 UR6 27IN ABS (SUTURE) ×4 IMPLANT
SUT VICRYL 4-0 PS2 18IN ABS (SUTURE) ×4 IMPLANT
SYR BULB IRRIGATION 50ML (SYRINGE) IMPLANT
TOWEL OR 17X24 6PK STRL BLUE (TOWEL DISPOSABLE) ×12 IMPLANT
TRAY FOLEY CATH SILVER 14FR (SET/KITS/TRAYS/PACK) ×4 IMPLANT
TRENDGUARD 450 HYBRID PRO PACK (MISCELLANEOUS)
TRENDGUARD 600 HYBRID PROC PK (MISCELLANEOUS)
TROCAR XCEL NON-BLD 11X100MML (ENDOMECHANICALS) ×4 IMPLANT
TROCAR XCEL NON-BLD 5MMX100MML (ENDOMECHANICALS) ×4 IMPLANT
TUBING INSUF HEATED (TUBING) ×4 IMPLANT
TUBING TUR DISP (UROLOGICAL SUPPLIES) IMPLANT
WARMER LAPAROSCOPE (MISCELLANEOUS) ×4 IMPLANT
WATER STERILE IRR 500ML POUR (IV SOLUTION) IMPLANT

## 2017-03-25 NOTE — Progress Notes (Signed)
Day of Surgery Procedure(s) (LRB): POD#0 ATTEMPTED LAPAROSCOPIC ASSISTED VAGINAL HYSTERECTOMY  CONVERTED TO  OPENED ABDOMINAL HYSTERECTOMY WITH SALPINGO OOPHORECTOMY (Bilateral) CYSTOSCOPY (Bilateral)  Subjective: Patient reports feeling alright. Pain moderately c/w pain meds. IVF  Objective: I have reviewed patient's vital signs, intake and output and medications. AFVSS  General: alert, cooperative and appears stated age Resp: clear to auscultation bilaterally Cardio: regular rate and rhythm, S1, S2 normal, no murmur, click, rub or gallop GI: soft, non-tender; bowel sounds normal; no masses,  no organomegaly and incision: clean, dry and intact Extremities: extremities normal, atraumatic, no cyanosis or edema No vaginal bleeding on pad  Assessment: s/p Procedure(s): ATTEMPTED LAPAROSCOPIC ASSISTED VAGINAL HYSTERECTOMY  CONVERTED TO  OPENED ABDOMINAL HYSTERECTOMY WITH SALPINGO OOPHORECTOMY (Bilateral) CYSTOSCOPY (Bilateral): stable in the immediate postoperative period  Plan: Advance diet Encourage ambulation  Cont HTN medication.   LOS: 0 days    Ranae Pilalise Jennifer Layken Doenges 03/25/2017, 11:43 AM

## 2017-03-25 NOTE — Transfer of Care (Signed)
Immediate Anesthesia Transfer of Care Note  Patient: Irine SealWanda M Mone  Procedure(s) Performed: Procedure(s): ATTEMPTED LAPAROSCOPIC ASSISTED VAGINAL HYSTERECTOMY  CONVERTED TO  OPENED ABDOMINAL HYSTERECTOMY WITH SALPINGO OOPHORECTOMY (Bilateral) CYSTOSCOPY (Bilateral)  Patient Location: PACU  Anesthesia Type:General  Level of Consciousness: awake, alert  and sedated  Airway & Oxygen Therapy: Patient Spontanous Breathing and Patient connected to nasal cannula oxygen  Post-op Assessment: Report given to RN and Post -op Vital signs reviewed and stable  Post vital signs: Reviewed and stable  Last Vitals:  Vitals:   03/25/17 0606 03/25/17 1011  BP: (!) 143/72 (P) 117/69  Pulse: (!) 52   Resp: 16   Temp: 36.5 C (P) 36.5 C  SpO2: 98%     Last Pain:  Vitals:   03/25/17 0606  TempSrc: Oral      Patients Stated Pain Goal: 5 (03/25/17 0609)  Complications: No apparent anesthesia complications

## 2017-03-25 NOTE — Anesthesia Procedure Notes (Signed)
Procedure Name: Intubation Date/Time: 03/25/2017 7:30 AM Performed by: Denna Haggard D Pre-anesthesia Checklist: Patient identified, Emergency Drugs available, Suction available and Patient being monitored Patient Re-evaluated:Patient Re-evaluated prior to induction Oxygen Delivery Method: Circle system utilized Preoxygenation: Pre-oxygenation with 100% oxygen Induction Type: IV induction Ventilation: Mask ventilation without difficulty Laryngoscope Size: Mac and 3 Grade View: Grade I Tube type: Oral Tube size: 7.0 mm Number of attempts: 1 Airway Equipment and Method: Stylet and Oral airway Placement Confirmation: ETT inserted through vocal cords under direct vision,  positive ETCO2 and breath sounds checked- equal and bilateral Secured at: 22 cm Tube secured with: Tape Dental Injury: Teeth and Oropharynx as per pre-operative assessment

## 2017-03-25 NOTE — Anesthesia Preprocedure Evaluation (Signed)
Anesthesia Evaluation  Patient identified by MRN, date of birth, ID band Patient awake    Reviewed: Allergy & Precautions, NPO status , Patient's Chart, lab work & pertinent test results, reviewed documented beta blocker date and time   Airway Mallampati: II  TM Distance: >3 FB Neck ROM: Full    Dental  (+) Partial Upper   Pulmonary neg pulmonary ROS,    Pulmonary exam normal breath sounds clear to auscultation       Cardiovascular hypertension, Pt. on medications and Pt. on home beta blockers Normal cardiovascular exam Rhythm:Regular Rate:Normal     Neuro/Psych  Headaches, negative psych ROS   GI/Hepatic negative GI ROS, Neg liver ROS,   Endo/Other  negative endocrine ROS  Renal/GU negative Renal ROS     Musculoskeletal  (+) Arthritis , Fibromyalgia -  Abdominal   Peds  Hematology negative hematology ROS (+)   Anesthesia Other Findings   Reproductive/Obstetrics negative OB ROS                             Anesthesia Physical Anesthesia Plan  ASA: II  Anesthesia Plan: General   Post-op Pain Management:    Induction: Intravenous  PONV Risk Score and Plan: 4 or greater and Ondansetron, Dexamethasone, Midazolam and Scopolamine patch - Pre-op  Airway Management Planned: Oral ETT  Additional Equipment:   Intra-op Plan:   Post-operative Plan: Extubation in OR  Informed Consent: I have reviewed the patients History and Physical, chart, labs and discussed the procedure including the risks, benefits and alternatives for the proposed anesthesia with the patient or authorized representative who has indicated his/her understanding and acceptance.   Dental advisory given  Plan Discussed with: CRNA  Anesthesia Plan Comments:         Anesthesia Quick Evaluation

## 2017-03-25 NOTE — Op Note (Signed)
PREOPERATIVE DIAGNOSES: 1. Complex hyperplasia with atypia  POSTOPERATIVE DIAGNOSES: 1. Complex hyperplasia with atypia 2. Dense adhesive disease  PROCEDURE PERFORMED: Attempted LAVH BSO converted to Total abdominal hysterectomy with BSO, LOA,  and cystoscopy  SURGEON: Dr. Belva AgeeElise Halsey Hammen ASSISTANT: Dr. Mitchel HonourMegan Morris  ANESTHESIA: General   ESTIMATED BLOOD LOSS: 50cc.  URINE OUTPUT: 700 cc of clear urine at the end of the procedure.  FLUIDS: 1400 cc of crystalloids.  COMPLICATIONS: None   TUBES: None.  DRAINS: Foley to gravity.  PATHOLOGY: Uterus, cervix, bilateral fallopian tubes and ovaries were sent to pathology for review.  FINDINGS: On exam, under anesthesia, normal appearing vulva and vagina, normal sized uterus  Operative findings demonstrated a small uterus with densely adherent bowel and omentum. Normal fallopian tubes and ovaries bilaterally. The bowel and omentum were normal appearing once dissected off uterus. Lynnae JanuaryFitz-Hugh Curtis on liver.   Procedure: A general anesthesia was induced and the patient was placed in the dirsal lithotomy position. The abdomen, perineum, and vagina were prepped and draped in the usual fashion. A foley catheter inserted into the bladder and attached to straight drainage. After the initial preparation, the procedure commenced at the vagina. With a speculum in place to visualize the cervix, the cervix was grasped and a jacobs tenaculum was placed within the uterine cavity for manipulation purposes being careful not to puncture the uterus. Attention was then turned to the abdomen. Following infiltration with Marcaine, an infraumbilical incision was made and the Veress needle was gently advanced taking care to feel for the typical sensation of penetrating the peritoneum. With CO2 infiltration, an opening pressure of 37 mmHg was noted, and following this, a pneumoperitoneum of 15 mmHg was created. A 11 mm trocar was then passed through the same incision and  the laparoscope was then inserted through the trocar sleeve. Visualization of the peritoneal cavity was then obtained and a brief inspection did not reveal any signs of complications from entry. Under direct observation, 5mm suprapubic port was placed taking care to respect anatomical landmarks and vessels. Once the placement of the port was complete, the actual laparoscopic procedure began. Upper abdominal survey notable for [employer] T[employer]itz-High Curtis above liver with adhesive disease. Pelvis noted to be filled with scar tissue. Colon wrapped around the left side of the uterus, densely adherent to the anterior, left side, and posterior aspect of the uterus. No normal fallopian tube or ovary was able to be visualized given dense adhesive disease. Some adhesions were taken down sharply with LSC scissors. However, it was determined that the adhesions were too dense to proceed safely laparoscopically. Thus, decision made to open the abdomen. A pfannenstiel incision was made and carried down to the underlying fascia. Fascia was incised in the midline and extended bilaterally with mayo scissors. Underlying rectus muscles were separated from the fascia superiorly and inferiorly in the usual fashion. Peritoneum was entered sharply with hemostat and metz with good visualization of the bladder and bowel. Once inside the abdominal cavity, a self-retaining retractor was placed. The uterus was then identified and grasped with upward traction. The colon and omentum were carefully dissected off of the uterus with a combination of sharp dissection. It was thoroughly inspected and noted to be completely intact without any areas that appeared denuded. The fallopian tube and ovary on the left were able to be visualized once the colon and omentum were dissected off and the hysterectomy began. The round ligaments on either side were identified and individually dissected and ligated. This allowed us to  then create a bladder flap by both blunt and  sharp dissection. The IP was isolated with good visualization of the ureters and incised using the ligasure. We then skeletonized the uterine vessels on either side and carefully dissected the bladder flap anteriorly until all the way down. Posteriorly, the peritoneum was dissected down toward the uterosacral ligaments. Heaney clamps were then placed at each isthmic portion of the cervical body junction where the uterine arteries adjoined the uterus. These were clamped, ligated and divided using the ligasure. The remainder of the uterus was then removed by the clamp-cut-ligation technique using #0 Vicryl on all major pedicles. With removal of the uterus, the vaginal cuff was closed with two haney stitches and then with serial figure of either stiches using #0 vicryl. Uterosacral ligaments were tied together. Hemostasis was then inspected and secured throughout the entire area including the vaginal cuff, all pedicles, and the bladder. The lap sponges were then removed and the self-retaining retractor was removed. The patient tolerated the operation nicely. There were no complications associated with this surgical procedure to this point. Attention then turned to the vagina. Uterine manipulator removed from the vagina. Cystoscopy was performed and a normal bladder survey. Brisk bilateral jets were visualized within one minute. The Foley catheter was reinserted and clear urine was noted. The sponge count was correct times 2 at this time. Having removed all instruments and packs, we re-scrubbed and then began closure of the abdomen. Arista placed. Hemostasis noted to be achieved. The fascia was closed with #0 Vicryl in a running continuous manner and the subcutaneous tissue was also closed with #2-0 Vicryl in a running continuous manner. Hemostasis was secured throughout the entire layers. The umbilical port incision fasica was closed with 0' vicryl and then the skin with 4'0 vicryl. The incision was then closed as  noted in the above operative findings. The patient tolerated the operation nicely and was then taken to the Recovery Room in good condition.   Belva Agee MD

## 2017-03-25 NOTE — Brief Op Note (Signed)
03/25/2017  10:58 AM  PATIENT:  Janice Hutchinson  66 y.o. female  PRE-OPERATIVE DIAGNOSIS:  simple and complex hyperplasia  POST-OPERATIVE DIAGNOSIS:  simple and complex hyperplasia  PROCEDURE:  Procedure(s): ATTEMPTED LAPAROSCOPIC ASSISTED VAGINAL HYSTERECTOMY  CONVERTED TO  OPENED ABDOMINAL HYSTERECTOMY WITH SALPINGO OOPHORECTOMY (Bilateral) CYSTOSCOPY (Bilateral)  SURGEON:  Surgeon(s) and Role:    * Leger, Madelaine Etienne, MD - Primary    * Morris, [place], DO - Assisting  PHYSICIAN ASSISTANT:   ASSISTANTS: Megan Morris   ANESTHESIA:   general  EBL:  Total I/O In: 1400 [I.V.:1400] Out: 750 [Urine:700; Blood:50]  BLOOD ADMINISTERED:none  DRAINS: Urinary Catheter (Foley)   LOCAL MEDICATIONS USED:  LIDOCAINE  and Amount: 5 ml  SPECIMEN:  Source of Specimen:  Uterus with cervic and b/l FT and ovaries  DISPOSITION OF SPECIMEN:  PATHOLOGY  COUNTS:  YES  TOURNIQUET:  * No tourniquets in log *  DICTATION: .Note written in EPIC  PLAN OF CARE: Admit to inpatient   PATIENT DISPOSITION:  PACU - hemodynamically stable.   Delay start of Pharmacological VTE agent (>24hrs) due to surgical blood loss or risk of bleeding: not applicable

## 2017-03-25 NOTE — Anesthesia Postprocedure Evaluation (Signed)
Anesthesia Post Note  Patient: Janice Hutchinson  Procedure(s) Performed: Procedure(s) (LRB): ATTEMPTED LAPAROSCOPIC ASSISTED VAGINAL HYSTERECTOMY  CONVERTED TO  OPENED ABDOMINAL HYSTERECTOMY WITH SALPINGO OOPHORECTOMY (Bilateral) CYSTOSCOPY (Bilateral)     Patient location during evaluation: PACU Anesthesia Type: General Level of consciousness: sedated and patient cooperative Pain management: pain level controlled Vital Signs Assessment: post-procedure vital signs reviewed and stable Respiratory status: spontaneous breathing Cardiovascular status: stable Anesthetic complications: no    Last Vitals:  Vitals:   03/25/17 1230 03/25/17 1300  BP: 106/60   Pulse: 67 65  Resp: 12 (!) 0  Temp:    SpO2: 97% 96%    Last Pain:  Vitals:   03/25/17 1300  TempSrc:   PainSc: Asleep                 Lewie LoronJohn Verlinda Slotnick

## 2017-03-26 ENCOUNTER — Encounter (HOSPITAL_BASED_OUTPATIENT_CLINIC_OR_DEPARTMENT_OTHER): Payer: Self-pay | Admitting: Obstetrics and Gynecology

## 2017-03-26 LAB — CBC
HCT: 42 % (ref 36.0–46.0)
Hemoglobin: 14.1 g/dL (ref 12.0–15.0)
MCH: 25.5 pg — AB (ref 26.0–34.0)
MCHC: 33.6 g/dL (ref 30.0–36.0)
MCV: 76.1 fL — AB (ref 78.0–100.0)
PLATELETS: 228 10*3/uL (ref 150–400)
RBC: 5.52 MIL/uL — ABNORMAL HIGH (ref 3.87–5.11)
RDW: 15.5 % (ref 11.5–15.5)
WBC: 10.8 10*3/uL — AB (ref 4.0–10.5)

## 2017-03-26 MED ORDER — IBUPROFEN 200 MG PO TABS
600.0000 mg | ORAL_TABLET | Freq: Three times a day (TID) | ORAL | Status: DC
  Administered 2017-03-26: 600 mg via ORAL
  Filled 2017-03-26 (×2): qty 3

## 2017-03-26 NOTE — Progress Notes (Signed)
1 Day Post-Op Procedure(s) (LRB): ATTEMPTED LAPAROSCOPIC ASSISTED VAGINAL HYSTERECTOMY  CONVERTED TO  OPENED ABDOMINAL HYSTERECTOMY WITH SALPINGO OOPHORECTOMY (Bilateral) CYSTOSCOPY (Bilateral)  Subjective: Patient reports tolerating PO and + flatus.  Pain well controlled, not requiring any IV narcotics.   Objective: I have reviewed patient's vital signs, intake and output and medications.  General: alert, cooperative and appears stated age Resp: clear to auscultation bilaterally Cardio: regular rate and rhythm, S1, S2 normal, no murmur, click, rub or gallop GI: soft, non-tender; bowel sounds normal; no masses,  no organomegaly and incision: clean, dry and intact Extremities: extremities normal, atraumatic, no cyanosis or edema No vaginal bleeding  Assessment: s/p Procedure(s): ATTEMPTED LAPAROSCOPIC ASSISTED VAGINAL HYSTERECTOMY  CONVERTED TO  OPENED ABDOMINAL HYSTERECTOMY WITH SALPINGO OOPHORECTOMY (Bilateral) CYSTOSCOPY (Bilateral): stable, progressing well, tolerating diet and passing flatus.  Plan: Advance diet Encourage ambulation Discontinue IV fluids Continue foley due to postop  LOS: 1 day    Ranae Pilalise Jennifer Victor Granados 03/26/2017, 9:46 AM

## 2017-03-27 MED ORDER — IBUPROFEN 600 MG PO TABS: 600.0000 mg | ORAL_TABLET | Freq: Four times a day (QID) | ORAL | 0 refills | Status: DC | PRN

## 2017-03-27 MED ORDER — DOCUSATE SODIUM 100 MG PO CAPS: 100.0000 mg | ORAL_CAPSULE | Freq: Two times a day (BID) | ORAL | 2 refills | Status: DC

## 2017-03-27 MED ORDER — FERROUS SULFATE 325 (65 FE) MG PO TBEC: 325.0000 mg | DELAYED_RELEASE_TABLET | Freq: Two times a day (BID) | ORAL | 2 refills | Status: DC

## 2017-03-27 NOTE — Discharge Summary (Signed)
Physician Discharge Summary  Patient ID: Janice Hutchinson MRN: [id number] DOB/AGE: 11-27-1950 66 y.o.  Admit date: 03/25/2017 Discharge date: 03/27/2017  Admission Diagnoses:  Discharge Diagnoses:  Active Problems:   S/P hysterectomy   Discharged Condition: good  Hospital Course: 66 yo admitted for hysterectomy. TAH BSO and cystoscopy performed. No complications.   Consults: None  Significant Diagnostic Studies: labs:  CBC    Component Value Date/Time   WBC 10.8 (H) 03/26/2017 0838   RBC 5.52 (H) 03/26/2017 0838   HGB 14.1 03/26/2017 0838   HCT 42.0 03/26/2017 0838   PLT 228 03/26/2017 0838   MCV 76.1 (L) 03/26/2017 0838   MCH 25.5 (L) 03/26/2017 0838   MCHC 33.6 03/26/2017 0838   RDW 15.5 03/26/2017 0838   LYMPHSABS 2.1 05/04/2014 1005   MONOABS 0.3 05/04/2014 1005   EOSABS 0.0 05/04/2014 1005   BASOSABS 0.0 05/04/2014 1005     Treatments: surgery: DSC LSC TAH BSO cystoscopy  Discharge Exam: Blood pressure (!) 112/58, pulse 70, temperature 98.6 F (37 C), temperature source Oral, resp. rate 18, height 5' 6.5" (1.689 m), weight 90 kg (198 lb 5 oz), SpO2 97 %. General appearance: alert, cooperative and appears stated age Resp: NWOB Chest wall: no tenderness Cardio: regular rate and rhythm, S1, S2 normal, no murmur, click, rub or gallop GI: soft, non-tender; bowel sounds normal; no masses,  no organomegaly Extremities: extremities normal, atraumatic, no cyanosis or edema Incision/Wound: no bleeding on pad  Incision c/d/i  Disposition: 01-Home or Self Care  Discharge Instructions    Call MD for:    Complete by:  As directed    Heavy vaginal bleeding or abnormal vaginal discharge   Call MD for:  difficulty breathing, headache or visual disturbances    Complete by:  As directed    Call MD for:  persistant dizziness or light-headedness    Complete by:  As directed    Call MD for:  persistant nausea and vomiting    Complete by:  As directed    Call MD for:   redness, tenderness, or signs of infection (pain, swelling, redness, odor or green/yellow discharge around incision site)    Complete by:  As directed    Call MD for:  severe uncontrolled pain    Complete by:  As directed    Call MD for:  temperature >100.4    Complete by:  As directed    Diet general    Complete by:  As directed    Discharge instructions    Complete by:  As directed    Clinic performed   Driving Restrictions    Complete by:  As directed    Do not drive until you are off narcotic pain medications and you feel like you can react in an emergency.   Increase activity slowly    Complete by:  As directed    Leave dressing on - Keep it clean, dry, and intact until clinic visit    Complete by:  As directed    Lifting restrictions    Complete by:  As directed    Don't lift anything more than 15-20 pounds   Sexual Activity Restrictions    Complete by:  As directed    Nothing in the vagina x 2 to 6 weeks. We will discuss at clinic visit.     Allergies as of 03/27/2017   No Known Allergies     Medication List    STOP taking these medications   hydrochlorothiazide 12.5  MG capsule Commonly known as:  MICROZIDE   TURMERIC PO     TAKE these medications   acetaminophen 500 MG tablet Commonly known as:  TYLENOL Take 500 mg by mouth every 6 (six) hours as needed for mild pain.   amLODipine 5 MG tablet Commonly known as:  NORVASC TAKE 1 TABLET BY MOUTH EVERY DAY   aspirin 81 MG tablet Take 81 mg by mouth daily.   b complex vitamins tablet Take 1 tablet by mouth daily.   bisoprolol-hydrochlorothiazide 5-6.25 MG tablet Commonly known as:  ZIAC Take 1 tablet by mouth daily.   docusate sodium 100 MG capsule Commonly known as:  COLACE Take 1 capsule (100 mg total) by mouth 2 (two) times daily.   ferrous sulfate 325 (65 FE) MG EC tablet Take 1 tablet (325 mg total) by mouth 2 (two) times daily.   ibuprofen 600 MG tablet Commonly known as:  ADVIL,MOTRIN Take 1  tablet (600 mg total) by mouth every 6 (six) hours as needed.   metoprolol tartrate 25 MG tablet Commonly known as:  LOPRESSOR Take 25 mg by mouth 2 (two) times daily.   WOMENS MULTIVITAMIN PLUS PO Once a day            Discharge Care Instructions        Start     Ordered   03/27/17 0000  Diet general     03/27/17 0726   03/27/17 0000  Increase activity slowly     03/27/17 0726   03/27/17 0000  Driving Restrictions    Comments:  Do not drive until you are off narcotic pain medications and you feel like you can react in an emergency.   03/27/17 0726   03/27/17 0000  Lifting restrictions    Comments:  Don't lift anything more than 15-20 pounds   03/27/17 0726   03/27/17 0000  Sexual Activity Restrictions    Comments:  Nothing in the vagina x 2 to 6 weeks. We will discuss at clinic visit.   03/27/17 0726   03/27/17 0000  Leave dressing on - Keep it clean, dry, and intact until clinic visit     03/27/17 0726   03/27/17 0000  Call MD for:    Comments:  Heavy vaginal bleeding or abnormal vaginal discharge   03/27/17 0726   03/27/17 0000  Call MD for:  temperature >100.4     03/27/17 0726   03/27/17 0000  Call MD for:  persistant nausea and vomiting     03/27/17 0726   03/27/17 0000  Call MD for:  severe uncontrolled pain     03/27/17 0726   03/27/17 0000  Call MD for:  redness, tenderness, or signs of infection (pain, swelling, redness, odor or green/yellow discharge around incision site)     03/27/17 0726   03/27/17 0000  Call MD for:  difficulty breathing, headache or visual disturbances     03/27/17 0726   03/27/17 0000  Discharge instructions    Comments:  Clinic performed   03/27/17 0726   03/27/17 0000  Call MD for:  persistant dizziness or light-headedness     03/27/17 0726   03/27/17 0000  docusate sodium (COLACE) 100 MG capsule  2 times daily     03/27/17 0726   03/27/17 0000  ferrous sulfate 325 (65 FE) MG EC tablet  2 times daily     03/27/17 0726    03/27/17 0000  ibuprofen (ADVIL,MOTRIN) 600 MG tablet  Every 6 hours PRN  03/27/17 0726     Oxycodone rx left over from [facility]  Signed: Ranae Pila 03/27/2017, 7:28 AM

## 2017-04-09 DIAGNOSIS — I10 Essential (primary) hypertension: Secondary | ICD-10-CM | POA: Diagnosis not present

## 2017-05-22 DIAGNOSIS — Z09 Encounter for follow-up examination after completed treatment for conditions other than malignant neoplasm: Secondary | ICD-10-CM | POA: Diagnosis not present

## 2017-05-22 DIAGNOSIS — N390 Urinary tract infection, site not specified: Secondary | ICD-10-CM | POA: Diagnosis not present

## 2017-05-22 DIAGNOSIS — Z1231 Encounter for screening mammogram for malignant neoplasm of breast: Secondary | ICD-10-CM | POA: Diagnosis not present

## 2017-07-03 DIAGNOSIS — R002 Palpitations: Secondary | ICD-10-CM | POA: Diagnosis not present

## 2017-07-03 DIAGNOSIS — Z79899 Other long term (current) drug therapy: Secondary | ICD-10-CM | POA: Diagnosis not present

## 2017-07-03 DIAGNOSIS — I10 Essential (primary) hypertension: Secondary | ICD-10-CM | POA: Diagnosis not present

## 2017-10-01 DIAGNOSIS — R002 Palpitations: Secondary | ICD-10-CM | POA: Diagnosis not present

## 2017-10-01 DIAGNOSIS — M7989 Other specified soft tissue disorders: Secondary | ICD-10-CM | POA: Diagnosis not present

## 2017-10-01 DIAGNOSIS — I10 Essential (primary) hypertension: Secondary | ICD-10-CM | POA: Diagnosis not present

## 2017-10-07 DIAGNOSIS — J0120 Acute ethmoidal sinusitis, unspecified: Secondary | ICD-10-CM | POA: Diagnosis not present

## 2017-11-02 DIAGNOSIS — Z79899 Other long term (current) drug therapy: Secondary | ICD-10-CM | POA: Diagnosis not present

## 2018-02-02 DIAGNOSIS — Z79899 Other long term (current) drug therapy: Secondary | ICD-10-CM | POA: Diagnosis not present

## 2018-02-02 DIAGNOSIS — R002 Palpitations: Secondary | ICD-10-CM | POA: Diagnosis not present

## 2018-02-02 DIAGNOSIS — I10 Essential (primary) hypertension: Secondary | ICD-10-CM | POA: Diagnosis not present

## 2018-04-07 DIAGNOSIS — H5203 Hypermetropia, bilateral: Secondary | ICD-10-CM | POA: Diagnosis not present

## 2018-05-04 DIAGNOSIS — H16223 Keratoconjunctivitis sicca, not specified as Sjogren's, bilateral: Secondary | ICD-10-CM | POA: Diagnosis not present

## 2018-05-04 DIAGNOSIS — H40043 Steroid responder, bilateral: Secondary | ICD-10-CM | POA: Diagnosis not present

## 2018-05-04 DIAGNOSIS — H40053 Ocular hypertension, bilateral: Secondary | ICD-10-CM | POA: Diagnosis not present

## 2018-05-18 DIAGNOSIS — H16223 Keratoconjunctivitis sicca, not specified as Sjogren's, bilateral: Secondary | ICD-10-CM | POA: Diagnosis not present

## 2018-05-28 DIAGNOSIS — Z1231 Encounter for screening mammogram for malignant neoplasm of breast: Secondary | ICD-10-CM | POA: Diagnosis not present

## 2018-06-04 DIAGNOSIS — M25473 Effusion, unspecified ankle: Secondary | ICD-10-CM | POA: Diagnosis not present

## 2018-06-04 DIAGNOSIS — I341 Nonrheumatic mitral (valve) prolapse: Secondary | ICD-10-CM | POA: Diagnosis not present

## 2018-06-04 DIAGNOSIS — I10 Essential (primary) hypertension: Secondary | ICD-10-CM | POA: Diagnosis not present

## 2018-06-22 DIAGNOSIS — R87615 Unsatisfactory cytologic smear of cervix: Secondary | ICD-10-CM | POA: Diagnosis not present

## 2018-07-05 DIAGNOSIS — Z79899 Other long term (current) drug therapy: Secondary | ICD-10-CM | POA: Diagnosis not present

## 2018-07-05 DIAGNOSIS — I10 Essential (primary) hypertension: Secondary | ICD-10-CM | POA: Diagnosis not present

## 2018-07-06 DIAGNOSIS — I10 Essential (primary) hypertension: Secondary | ICD-10-CM | POA: Diagnosis not present

## 2018-07-06 DIAGNOSIS — Z79899 Other long term (current) drug therapy: Secondary | ICD-10-CM | POA: Diagnosis not present

## 2018-08-13 DIAGNOSIS — Z79899 Other long term (current) drug therapy: Secondary | ICD-10-CM | POA: Diagnosis not present

## 2018-09-24 DIAGNOSIS — M25511 Pain in right shoulder: Secondary | ICD-10-CM | POA: Diagnosis not present

## 2018-10-22 DIAGNOSIS — N390 Urinary tract infection, site not specified: Secondary | ICD-10-CM | POA: Diagnosis not present

## 2018-11-17 DIAGNOSIS — R7301 Impaired fasting glucose: Secondary | ICD-10-CM | POA: Diagnosis not present

## 2018-11-17 DIAGNOSIS — I10 Essential (primary) hypertension: Secondary | ICD-10-CM | POA: Diagnosis not present

## 2018-11-17 DIAGNOSIS — Z79899 Other long term (current) drug therapy: Secondary | ICD-10-CM | POA: Diagnosis not present

## 2018-11-17 DIAGNOSIS — N390 Urinary tract infection, site not specified: Secondary | ICD-10-CM | POA: Diagnosis not present

## 2018-11-29 DIAGNOSIS — N390 Urinary tract infection, site not specified: Secondary | ICD-10-CM | POA: Diagnosis not present

## 2019-03-17 ENCOUNTER — Encounter: Payer: Self-pay | Admitting: Orthopedic Surgery

## 2019-03-17 ENCOUNTER — Ambulatory Visit: Payer: Self-pay

## 2019-03-17 ENCOUNTER — Ambulatory Visit (INDEPENDENT_AMBULATORY_CARE_PROVIDER_SITE_OTHER): Payer: Medicare Other | Admitting: Orthopedic Surgery

## 2019-03-17 VITALS — Ht 66.0 in | Wt 198.0 lb

## 2019-03-17 DIAGNOSIS — G8929 Other chronic pain: Secondary | ICD-10-CM

## 2019-03-17 DIAGNOSIS — M25561 Pain in right knee: Secondary | ICD-10-CM | POA: Diagnosis not present

## 2019-03-17 DIAGNOSIS — M5441 Lumbago with sciatica, right side: Secondary | ICD-10-CM

## 2019-03-20 ENCOUNTER — Encounter: Payer: Self-pay | Admitting: Orthopedic Surgery
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
## 2019-03-24 DIAGNOSIS — I10 Essential (primary) hypertension: Secondary | ICD-10-CM | POA: Diagnosis not present

## 2019-03-24 DIAGNOSIS — R002 Palpitations: Secondary | ICD-10-CM | POA: Diagnosis not present

## 2019-03-24 DIAGNOSIS — Z79899 Other long term (current) drug therapy: Secondary | ICD-10-CM | POA: Diagnosis not present

## 2019-03-24 DIAGNOSIS — M7989 Other specified soft tissue disorders: Secondary | ICD-10-CM | POA: Diagnosis not present

## 2019-03-24 DIAGNOSIS — I341 Nonrheumatic mitral (valve) prolapse: Secondary | ICD-10-CM | POA: Diagnosis not present

## 2019-04-06 ENCOUNTER — Ambulatory Visit: Payer: Self-pay

## 2019-04-06 ENCOUNTER — Other Ambulatory Visit: Payer: Self-pay

## 2019-04-06 ENCOUNTER — Ambulatory Visit: Payer: Medicare Other | Admitting: Physical Medicine and Rehabilitation

## 2019-04-06 ENCOUNTER — Encounter: Payer: Self-pay | Admitting: Physical Medicine and Rehabilitation

## 2019-04-06 VITALS — BP 139/82 | HR 55

## 2019-04-06 DIAGNOSIS — M5416 Radiculopathy, lumbar region: Secondary | ICD-10-CM

## 2019-04-06 MED ORDER — METHYLPREDNISOLONE ACETATE 80 MG/ML IJ SUSP: 80.0000 mg | Freq: Once | INTRAMUSCULAR | Status: DC

## 2019-04-06 NOTE — Progress Notes (Signed)
 .  Numeric Pain Rating Scale and Functional Assessment Average Pain 6   In the last MONTH (on 0-10 scale) has pain interfered with the following?  1. General activity like being  able to carry out your everyday physical activities such as walking, climbing stairs, carrying groceries, or moving a chair?  Rating(6)   +Driver, -BT, -Dye Allergies.  

## 2019-04-20 ENCOUNTER — Other Ambulatory Visit: Payer: Self-pay | Admitting: *Deleted

## 2019-04-20 DIAGNOSIS — I10 Essential (primary) hypertension: Secondary | ICD-10-CM

## 2019-04-20 DIAGNOSIS — I341 Nonrheumatic mitral (valve) prolapse: Secondary | ICD-10-CM

## 2019-04-20 DIAGNOSIS — R002 Palpitations: Secondary | ICD-10-CM

## 2019-04-20 DIAGNOSIS — M7989 Other specified soft tissue disorders: Secondary | ICD-10-CM

## 2019-04-30 NOTE — Progress Notes (Signed)
Janice Hutchinson - 68 y.o. female MRN [id number]  Date of birth: Nov 07, 1950  Office Visit Note: Visit Date: 04/06/2019 PCP: Philemon Kingdom, MD Referred by: Philemon Kingdom, MD  Subjective: Chief Complaint  Patient presents with  . Lower Back - Pain  . Right Thigh - Pain   HPI:  Janice Hutchinson is a 68 y.o. female who comes in today At the request of Dr. Aldean Baker for right low back pain and right radicular leg pain.  I have actually seen the patient in the past I think the last time we saw her was in 2017.  She was having radicular leg pain at that time and we did complete transforaminal injections with decent relief for several months.  After that it appears that she was [facility] by Dr. Retia Passe.  It appears that he also completed some transforaminal type injections over that year and she did well.  More recently she is again having this right lower back and buttock pain that radiates into the thigh.  Really nothing past the knee.  She reports worsening with walking and standing for a long time.  She rates her pain as a 6 out of 10.  She has been evaluated from an orthopedic standpoint not felt like this is a joint issue by Dr. Lajoyce Corners.  Her case is complicated by fibromyalgia.  We are going to try diagnostic and hopefully therapeutic right L5-S1 interlaminar injection.  Depending on relief would consider facet joint block as this may be more of a facet joint syndrome.  She had mainly facet arthritis at L4-5 with some lateral recess narrowing.  Also would consider updated MRI of the lumbar spine.  ROS Otherwise per HPI.  Assessment & Plan: Visit Diagnoses:  1. Lumbar radiculopathy     Plan: No additional findings.   Meds & Orders:  Meds ordered this encounter  Medications  . methylPREDNISolone acetate (DEPO-MEDROL) injection 80 mg    Orders Placed This Encounter  Procedures  . XR C-ARM NO REPORT  . Epidural Steroid injection    Follow-up: No  follow-ups on file.   Procedures: No procedures performed  Lumbar Epidural Steroid Injection - Interlaminar Approach with Fluoroscopic Guidance  Patient: Janice Hutchinson      Date of Birth: 04-05-1951 MRN: [id number] PCP: Philemon Kingdom, MD      Visit Date: 04/06/2019   Universal Protocol:     Consent Given By: the patient  Position: PRONE  Additional Comments: Vital signs were monitored before and after the procedure. Patient was prepped and draped in the usual sterile fashion. The correct patient, procedure, and site was verified.   Injection Procedure Details:  Procedure Site One Meds Administered:  Meds ordered this encounter  Medications  . methylPREDNISolone acetate (DEPO-MEDROL) injection 80 mg     Laterality: Right  Location/Site:  L5-S1  Needle size: 20 G  Needle type: Tuohy  Needle Placement: Paramedian epidural  Findings:   -Comments: Excellent flow of contrast into the epidural space.  Procedure Details: Using a paramedian approach from the side mentioned above, the region overlying the inferior lamina was localized under fluoroscopic visualization and the soft tissues overlying this structure were infiltrated with 4 ml. of 1% Lidocaine without Epinephrine. The Tuohy needle was inserted into the epidural space using a paramedian approach.   The epidural space was localized using loss of resistance along with lateral and bi-planar fluoroscopic views.  After negative aspirate for air, blood, and CSF,  a 2 ml. volume of Isovue-250 was injected into the epidural space and the flow of contrast was observed. Radiographs were obtained for documentation purposes.    The injectate was administered into the level noted above.   Additional Comments:  The patient tolerated the procedure well Dressing: 2 x 2 sterile gauze and Band-Aid    Post-procedure details: Patient was observed during the procedure. Post-procedure instructions were reviewed.  Patient  left the clinic in stable condition.   Clinical History: MRI LUMBAR SPINE WITHOUT CONTRAST  TECHNIQUE: Multiplanar, multisequence MR imaging of the lumbar spine was performed. No intravenous contrast was administered.  COMPARISON:  12/03/2004  FINDINGS: Segmentation:  5 lumbar type vertebral bodies.  Alignment: Mild curvature convex to the right with the apex at L2-3. 8 mm anterolisthesis at L4-5, described below.  Vertebrae: Discogenic marrow changes at the L4-5 level. No other vertebral body pathology.  Conus medullaris: Extends to the L1 level and appears normal.  Paraspinal and other soft tissues: Normal.  Disc levels: L1-2: Normal.  L2-3: Minimal bulging of the discs. No stenosis or neural compression. Mild facet hypertrophy at L3-4 but without slippage in this position.  L4-5: Advanced bilateral facet arthropathy. Anterolisthesis of 8 mm, increased from 4 mm seen in 2006. Disc degeneration with circumferential bulging of the disc. Stenosis of both lateral recesses and neural foramina that could cause neural compression on either or both sides. This appearance would likely worsen with standing or flexion. Discogenic edema within the L4 and L5 endplates, likely associated with low back pain.  L5-S1: Normal appearance of the disc. Minimal facet degeneration. No stenosis.  IMPRESSION: Worsened facet arthropathy at L4-5 with anterolisthesis of 8 mm. This measured 4 mm in 2006. Bulging of the disc. Stenosis of the lateral recesses and neural foramina likely to cause neural compression. This appearance could worsen with standing or flexion. Discogenic marrow edema of the endplates that could be associated with back pain.  Disc bulge and facet hypertrophy at L3-4 but without stenosis at this time.  Mild facet degeneration at L5-S1 but without slippage or stenosis.   Electronically Signed   By: Nelson Chimes M.D.   On: 01/06/2016 10:26      Objective:  VS:  HT:    WT:   BMI:     BP:139/82  HR:(!) 55bpm  TEMP: ( )  RESP:  Physical Exam  Ortho Exam Imaging: No results found.

## 2019-04-30 NOTE — Procedures (Signed)
Lumbar Epidural Steroid Injection - Interlaminar Approach with Fluoroscopic Guidance  Patient: Janice Hutchinson      Date of Birth: 1951/06/02 MRN: [id number] PCP: Ernestene Kiel, MD      Visit Date: 04/06/2019   Universal Protocol:     Consent Given By: the patient  Position: PRONE  Additional Comments: Vital signs were monitored before and after the procedure. Patient was prepped and draped in the usual sterile fashion. The correct patient, procedure, and site was verified.   Injection Procedure Details:  Procedure Site One Meds Administered:  Meds ordered this encounter  Medications  . methylPREDNISolone acetate (DEPO-MEDROL) injection 80 mg     Laterality: Right  Location/Site:  L5-S1  Needle size: 20 G  Needle type: Tuohy  Needle Placement: Paramedian epidural  Findings:   -Comments: Excellent flow of contrast into the epidural space.  Procedure Details: Using a paramedian approach from the side mentioned above, the region overlying the inferior lamina was localized under fluoroscopic visualization and the soft tissues overlying this structure were infiltrated with 4 ml. of 1% Lidocaine without Epinephrine. The Tuohy needle was inserted into the epidural space using a paramedian approach.   The epidural space was localized using loss of resistance along with lateral and bi-planar fluoroscopic views.  After negative aspirate for air, blood, and CSF, a 2 ml. volume of Isovue-250 was injected into the epidural space and the flow of contrast was observed. Radiographs were obtained for documentation purposes.    The injectate was administered into the level noted above.   Additional Comments:  The patient tolerated the procedure well Dressing: 2 x 2 sterile gauze and Band-Aid    Post-procedure details: Patient was observed during the procedure. Post-procedure instructions were reviewed.  Patient left the clinic in stable condition.

## 2019-05-23 ENCOUNTER — Ambulatory Visit (INDEPENDENT_AMBULATORY_CARE_PROVIDER_SITE_OTHER): Payer: Medicare Other

## 2019-05-23 ENCOUNTER — Other Ambulatory Visit: Payer: Self-pay

## 2019-05-23 DIAGNOSIS — I341 Nonrheumatic mitral (valve) prolapse: Secondary | ICD-10-CM | POA: Diagnosis not present

## 2019-05-23 DIAGNOSIS — I10 Essential (primary) hypertension: Secondary | ICD-10-CM

## 2019-05-23 DIAGNOSIS — M7989 Other specified soft tissue disorders: Secondary | ICD-10-CM | POA: Diagnosis not present

## 2019-05-23 DIAGNOSIS — R002 Palpitations: Secondary | ICD-10-CM | POA: Diagnosis not present

## 2019-05-23 NOTE — Progress Notes (Signed)
Complete echocardiogram has been performed.  Jimmy Edrees Valent RDCS, RVCT

## 2019-05-24 ENCOUNTER — Encounter: Payer: Self-pay | Admitting: *Deleted

## 2019-05-30 DIAGNOSIS — Z1231 Encounter for screening mammogram for malignant neoplasm of breast: Secondary | ICD-10-CM | POA: Diagnosis not present

## 2019-06-01 ENCOUNTER — Other Ambulatory Visit: Payer: Self-pay | Admitting: Obstetrics and Gynecology

## 2019-06-01 DIAGNOSIS — R928 Other abnormal and inconclusive findings on diagnostic imaging of breast: Secondary | ICD-10-CM

## 2019-06-08 ENCOUNTER — Ambulatory Visit
Admission: RE | Admit: 2019-06-08 | Discharge: 2019-06-08 | Disposition: A | Discharge status: Home / Self Care | Payer: Medicare Other | Source: Ambulatory Visit | Attending: Obstetrics and Gynecology | Admitting: Obstetrics and Gynecology

## 2019-06-08 ENCOUNTER — Ambulatory Visit: Payer: Medicare Other

## 2019-06-08 ENCOUNTER — Other Ambulatory Visit: Payer: Self-pay

## 2019-06-08 DIAGNOSIS — R928 Other abnormal and inconclusive findings on diagnostic imaging of breast: Secondary | ICD-10-CM

## 2019-06-08 DIAGNOSIS — N958 Other specified menopausal and perimenopausal disorders: Secondary | ICD-10-CM | POA: Diagnosis not present

## 2019-07-26 DIAGNOSIS — Z79899 Other long term (current) drug therapy: Secondary | ICD-10-CM | POA: Diagnosis not present

## 2019-07-26 DIAGNOSIS — I10 Essential (primary) hypertension: Secondary | ICD-10-CM | POA: Diagnosis not present

## 2019-07-26 DIAGNOSIS — R7301 Impaired fasting glucose: Secondary | ICD-10-CM | POA: Diagnosis not present

## 2019-07-26 DIAGNOSIS — M25511 Pain in right shoulder: Secondary | ICD-10-CM | POA: Diagnosis not present

## 2019-08-03 DIAGNOSIS — M25511 Pain in right shoulder: Secondary | ICD-10-CM | POA: Diagnosis not present

## 2019-08-17 DIAGNOSIS — M25511 Pain in right shoulder: Secondary | ICD-10-CM | POA: Diagnosis not present

## 2019-10-25 DIAGNOSIS — H02882 Meibomian gland dysfunction right lower eyelid: Secondary | ICD-10-CM | POA: Diagnosis not present

## 2019-11-25 DIAGNOSIS — R002 Palpitations: Secondary | ICD-10-CM | POA: Diagnosis not present

## 2019-11-25 DIAGNOSIS — Z79899 Other long term (current) drug therapy: Secondary | ICD-10-CM | POA: Diagnosis not present

## 2019-11-25 DIAGNOSIS — I10 Essential (primary) hypertension: Secondary | ICD-10-CM | POA: Diagnosis not present

## 2019-11-25 DIAGNOSIS — M19011 Primary osteoarthritis, right shoulder: Secondary | ICD-10-CM | POA: Diagnosis not present

## 2019-12-27 DIAGNOSIS — Z79899 Other long term (current) drug therapy: Secondary | ICD-10-CM | POA: Diagnosis not present

## 2019-12-27 DIAGNOSIS — I10 Essential (primary) hypertension: Secondary | ICD-10-CM | POA: Diagnosis not present

## 2020-01-19 IMAGING — MG MM DIGITAL DIAGNOSTIC UNILAT*R* W/ TOMO W/ CAD
4 series · 4 of 12 positions shown · non-contrast
Comparison: Previous exam(s).

CLINICAL DATA: 68-year-old female for further evaluation of
possible RIGHT breast asymmetry on screening mammogram.

EXAM:
DIGITAL DIAGNOSTIC UNILATERAL RIGHT MAMMOGRAM WITH TOMO

[R CC synth-2D]
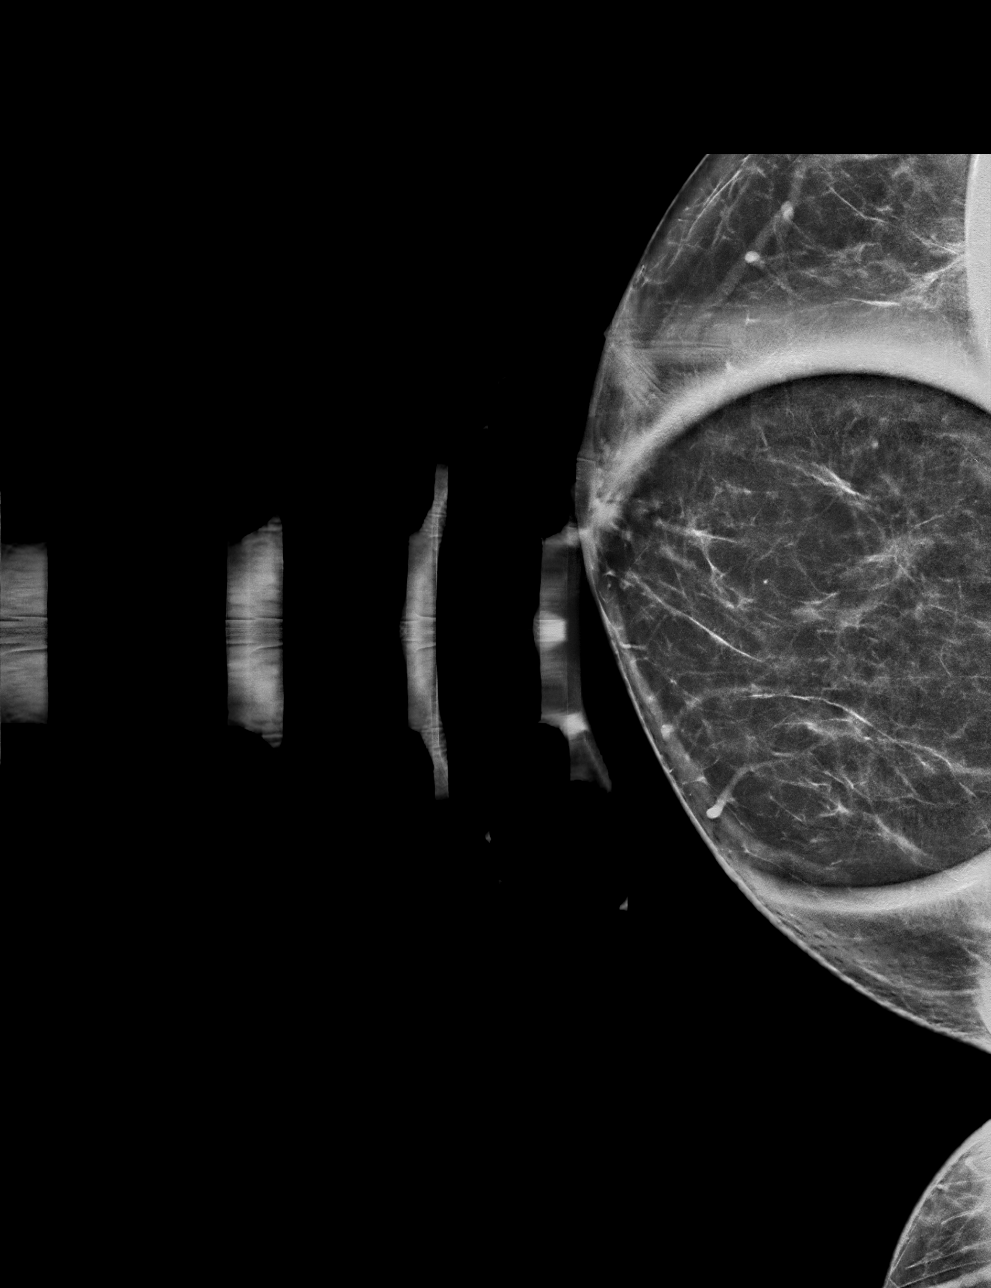

[R MLO synth-2D]
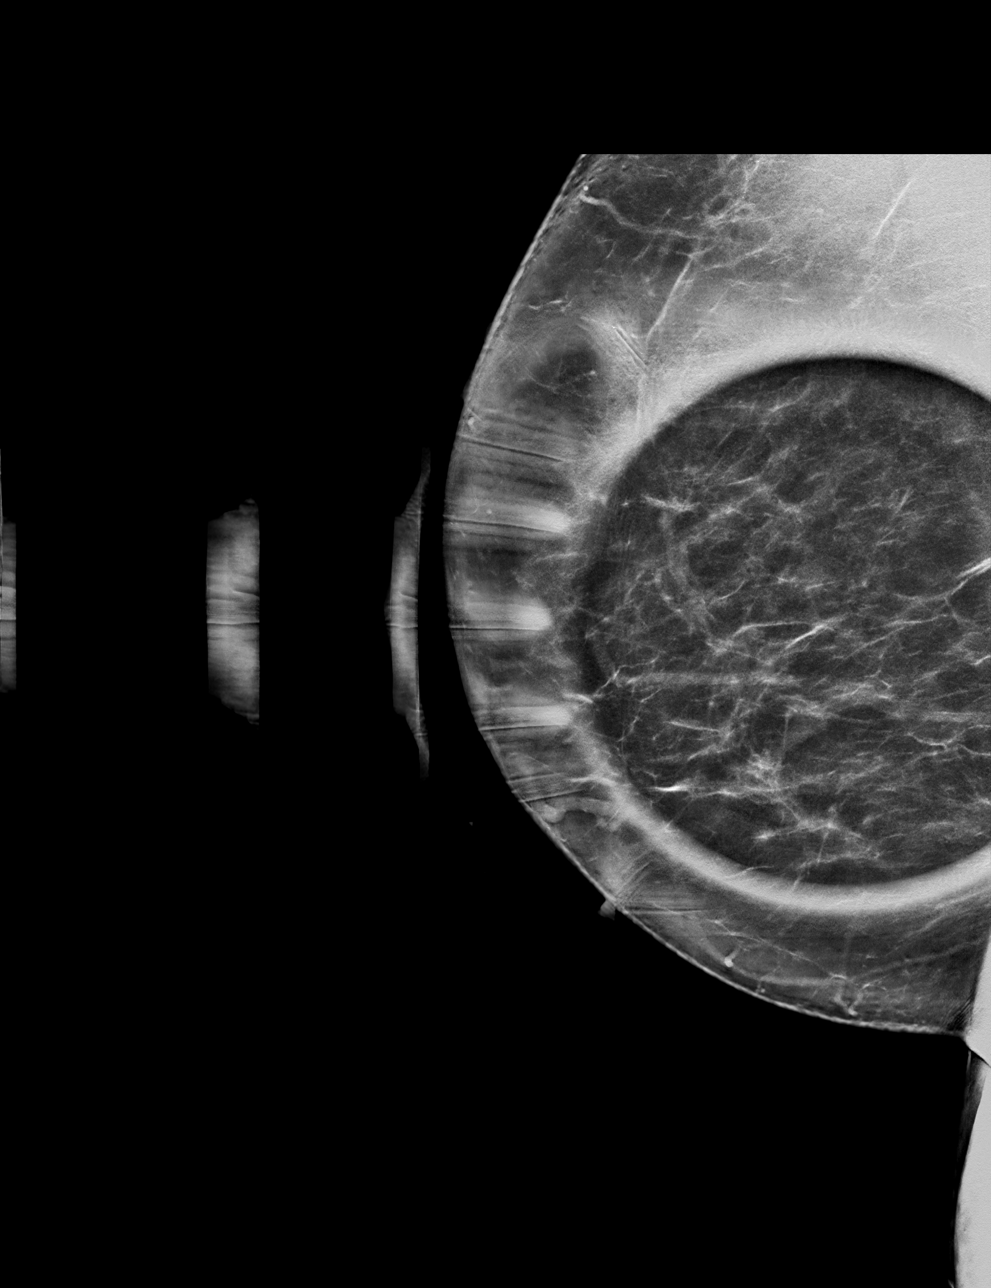

[R MLO tomo · tomo slice 43/84.0]
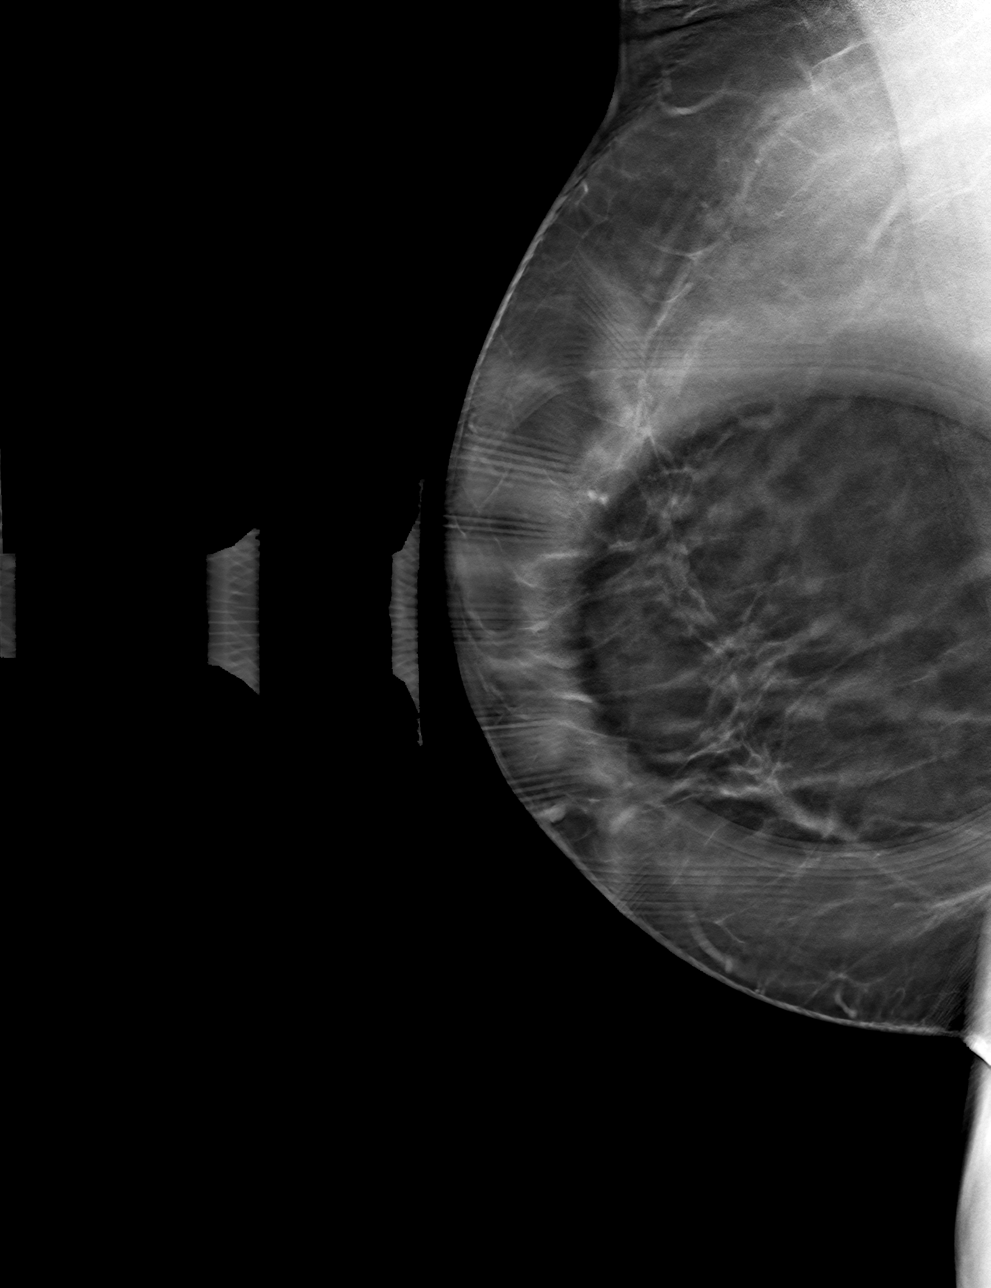

[R CC tomo · tomo slice 35/70.0]
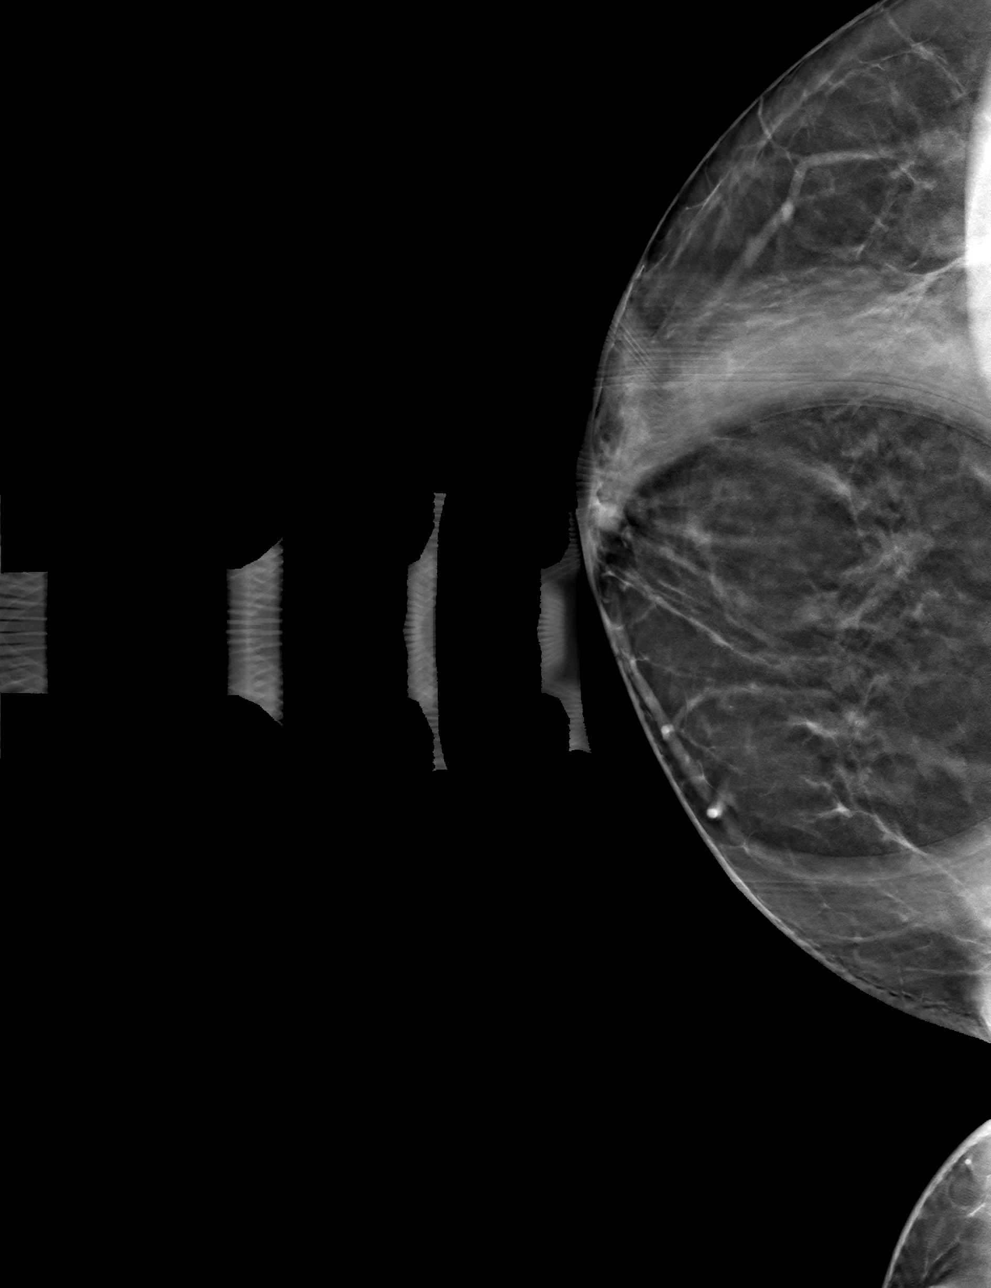

[4 of 12 positions shown; findings below may reference images not displayed]

ACR Breast Density Category b: There are scattered areas of
fibroglandular density.
FINDINGS: 2D/3D spot compression views of the RIGHT breast demonstrate no
persistent suspicious abnormality at the site of the screening study
finding. On today's images, this area has a similar appearance to
remote studies
IMPRESSION: No persistent abnormality at the site of the screening study
finding.

RECOMMENDATION:
Bilateral screening mammogram in 1 year.

I have discussed the findings and recommendations with the patient.
If applicable, a reminder letter will be sent to the patient
regarding the next appointment.

BI-RADS CATEGORY  1: Negative.

## 2020-06-01 DIAGNOSIS — Z79899 Other long term (current) drug therapy: Secondary | ICD-10-CM | POA: Diagnosis not present

## 2020-06-01 DIAGNOSIS — R002 Palpitations: Secondary | ICD-10-CM | POA: Diagnosis not present

## 2020-06-01 DIAGNOSIS — Z0000 Encounter for general adult medical examination without abnormal findings: Secondary | ICD-10-CM | POA: Diagnosis not present

## 2020-06-01 DIAGNOSIS — I10 Essential (primary) hypertension: Secondary | ICD-10-CM | POA: Diagnosis not present

## 2020-06-05 DIAGNOSIS — Z1231 Encounter for screening mammogram for malignant neoplasm of breast: Secondary | ICD-10-CM | POA: Diagnosis not present

## 2020-11-29 DIAGNOSIS — Z79899 Other long term (current) drug therapy: Secondary | ICD-10-CM | POA: Diagnosis not present

## 2020-11-29 DIAGNOSIS — I10 Essential (primary) hypertension: Secondary | ICD-10-CM | POA: Diagnosis not present

## 2021-01-24 DIAGNOSIS — H6693 Otitis media, unspecified, bilateral: Secondary | ICD-10-CM | POA: Diagnosis not present

## 2021-06-03 DIAGNOSIS — Z1159 Encounter for screening for other viral diseases: Secondary | ICD-10-CM | POA: Diagnosis not present

## 2021-06-03 DIAGNOSIS — M2550 Pain in unspecified joint: Secondary | ICD-10-CM | POA: Diagnosis not present

## 2021-06-03 DIAGNOSIS — M79645 Pain in left finger(s): Secondary | ICD-10-CM | POA: Diagnosis not present

## 2021-06-03 DIAGNOSIS — Z79899 Other long term (current) drug therapy: Secondary | ICD-10-CM | POA: Diagnosis not present

## 2021-06-03 DIAGNOSIS — I10 Essential (primary) hypertension: Secondary | ICD-10-CM | POA: Diagnosis not present

## 2021-06-03 DIAGNOSIS — G5712 Meralgia paresthetica, left lower limb: Secondary | ICD-10-CM | POA: Diagnosis not present

## 2021-06-03 DIAGNOSIS — M5136 Other intervertebral disc degeneration, lumbar region: Secondary | ICD-10-CM | POA: Diagnosis not present

## 2021-06-03 DIAGNOSIS — Z0000 Encounter for general adult medical examination without abnormal findings: Secondary | ICD-10-CM | POA: Diagnosis not present

## 2021-06-13 DIAGNOSIS — Z1231 Encounter for screening mammogram for malignant neoplasm of breast: Secondary | ICD-10-CM | POA: Diagnosis not present

## 2021-06-13 DIAGNOSIS — R29890 Loss of height: Secondary | ICD-10-CM | POA: Diagnosis not present

## 2021-07-05 DIAGNOSIS — D582 Other hemoglobinopathies: Secondary | ICD-10-CM | POA: Diagnosis not present

## 2021-07-08 DIAGNOSIS — Z803 Family history of malignant neoplasm of breast: Secondary | ICD-10-CM | POA: Diagnosis not present

## 2021-09-30 DIAGNOSIS — M79645 Pain in left finger(s): Secondary | ICD-10-CM | POA: Diagnosis not present

## 2021-09-30 DIAGNOSIS — R768 Other specified abnormal immunological findings in serum: Secondary | ICD-10-CM | POA: Diagnosis not present

## 2021-10-01 DIAGNOSIS — M79645 Pain in left finger(s): Secondary | ICD-10-CM | POA: Diagnosis not present

## 2021-10-23 DIAGNOSIS — M1812 Unilateral primary osteoarthritis of first carpometacarpal joint, left hand: Secondary | ICD-10-CM | POA: Diagnosis not present

## 2021-12-09 DIAGNOSIS — Z79899 Other long term (current) drug therapy: Secondary | ICD-10-CM | POA: Diagnosis not present

## 2021-12-09 DIAGNOSIS — R609 Edema, unspecified: Secondary | ICD-10-CM | POA: Diagnosis not present

## 2021-12-09 DIAGNOSIS — R0602 Shortness of breath: Secondary | ICD-10-CM | POA: Diagnosis not present

## 2021-12-09 DIAGNOSIS — I10 Essential (primary) hypertension: Secondary | ICD-10-CM | POA: Diagnosis not present

## 2021-12-09 DIAGNOSIS — R7309 Other abnormal glucose: Secondary | ICD-10-CM | POA: Diagnosis not present

## 2021-12-24 ENCOUNTER — Encounter: Payer: Self-pay | Admitting: Internal Medicine

## 2021-12-24 DIAGNOSIS — I340 Nonrheumatic mitral (valve) insufficiency: Secondary | ICD-10-CM | POA: Diagnosis not present

## 2021-12-24 DIAGNOSIS — I351 Nonrheumatic aortic (valve) insufficiency: Secondary | ICD-10-CM | POA: Diagnosis not present

## 2022-04-14 DIAGNOSIS — I10 Essential (primary) hypertension: Secondary | ICD-10-CM | POA: Diagnosis not present

## 2022-04-17 ENCOUNTER — Encounter (INDEPENDENT_AMBULATORY_CARE_PROVIDER_SITE_OTHER): Payer: Self-pay | Admitting: Internal Medicine

## 2022-04-17 ENCOUNTER — Ambulatory Visit (INDEPENDENT_AMBULATORY_CARE_PROVIDER_SITE_OTHER): Payer: Self-pay | Admitting: Internal Medicine

## 2022-04-17 VITALS — BP 136/83 | HR 60 | Temp 97.9°F | Ht 66.0 in | Wt 217.0 lb

## 2022-04-17 DIAGNOSIS — Z0289 Encounter for other administrative examinations: Secondary | ICD-10-CM

## 2022-04-17 DIAGNOSIS — Z6835 Body mass index (BMI) 35.0-35.9, adult: Secondary | ICD-10-CM

## 2022-04-17 DIAGNOSIS — R5383 Other fatigue: Secondary | ICD-10-CM

## 2022-04-17 DIAGNOSIS — E669 Obesity, unspecified: Secondary | ICD-10-CM

## 2022-04-17 NOTE — Progress Notes (Signed)
Office: [phone]  /  Fax: [phone]  Initial Visit  Janice Hutchinson was seen in clinic today to evaluate for obesity. She is interested in losing weight to improve overall health and reduce the risk of weight related complications. Referred by PCP has pre-diabetes. Has been trying to lose weight via exercise. Currently not following nutritional plan. Has family members who are overweight.  She presents today to review program treatment options, initial physical assessment, and evaluation.      Past medical history includes:   Past Medical History:  Diagnosis Date   Diverticulosis of colon    Family history of adverse reaction to anesthesia    mom gets PONV    Fibromyalgia    History of chest pain    History of headache    Hypertension    Lumbar back pain    Mitral valve prolapse    Overweight(278.02)    Paresthesia of hand    Sickle cell trait (HCC)      Objective:   BP 136/83   Pulse 60   Temp 97.9 F (36.6 C)   Ht 5\' 6"  (1.676 m)   Wt 217 lb (98.4 kg)   SpO2 99%   BMI 35.02 kg/m  She was weighed on the bioimpedance scale:  Body mass index is 35.02 kg/m.  General:  Alert, oriented and cooperative. Patient is in no acute distress.  Respiratory: Normal respiratory effort, no problems with respiration noted  Extremities: Normal range of motion.    Mental Status: Normal mood and affect. Normal behavior. Normal judgment and thought content.   Assessment and Plan:  1. Class 2 obesity without serious comorbidity with body mass index (BMI) of 35.0 to 35.9 in adult, unspecified obesity type  2. Other fatigue - EKG 12-Lead      Obesity Treatment Plan:  She will work on garnering support from family and friends to begin weight loss journey. Work on eliminating or reducing the presence of highly processed, calorie dense foods in the home. Complete provided nutritional and psychosocial assessment questionnaire.    Janice Hutchinson will follow up in the  next 1-2 weeks to review the above steps and continue evaluation and treatment.  Obesity Education Performed Today:  She was weighed on the bioimpedance scale and results were discussed and documented in the synopsis.  We discussed obesity as a disease and the importance of a more detailed evaluation of all the factors contributing to the disease.  We discussed the importance of long term lifestyle changes which include nutrition, exercise and behavioral modifications as well as the importance of customizing this to her specific health and social needs.  We discussed the benefits of reaching a healthier weight to alleviate the symptoms of existing conditions and reduce the risks of the biomechanical, metabolic and psychological effects of obesity.  We discussed the goals of this program is to improve her overall health and not simply achieve a specific BMI.  Frequent visits are very important to patient success. I plan to see her every 2 weeks for the first 3 months and then evaluate the visit frequency after that time. I explained obesity is a life-long chronic disease and long term treatments would be required. Medications to help her follow his eating plan may be offered as appropriate but are not required. All medication decisions will be made together after the initial workup is done and benefits and side effects are discussed in depth.  The clinic rules were reviewed including the late policy,  cancellation policy, no show and program fees.  Janice Hutchinson appears to be in the action stage of change and states they are ready to start intensive lifestyle modifications and behavioral modifications.  30 minutes was spent today on this visit including the above counseling, pre-visit chart review, and post-visit documentation.  Janice Rancher, MD

## 2022-04-28 ENCOUNTER — Encounter (INDEPENDENT_AMBULATORY_CARE_PROVIDER_SITE_OTHER): Payer: Self-pay

## 2022-04-28 ENCOUNTER — Ambulatory Visit (INDEPENDENT_AMBULATORY_CARE_PROVIDER_SITE_OTHER): Payer: Medicare Other | Admitting: Internal Medicine

## 2022-05-01 ENCOUNTER — Ambulatory Visit (INDEPENDENT_AMBULATORY_CARE_PROVIDER_SITE_OTHER): Payer: Medicare Other | Admitting: Internal Medicine

## 2022-05-01 ENCOUNTER — Encounter (INDEPENDENT_AMBULATORY_CARE_PROVIDER_SITE_OTHER): Payer: Self-pay | Admitting: Internal Medicine

## 2022-05-01 VITALS — BP 169/83 | HR 54 | Temp 98.2°F | Ht 66.0 in | Wt 215.4 lb

## 2022-05-01 DIAGNOSIS — I10 Essential (primary) hypertension: Secondary | ICD-10-CM | POA: Diagnosis not present

## 2022-05-01 DIAGNOSIS — E66811 Obesity, class 1: Secondary | ICD-10-CM | POA: Insufficient documentation

## 2022-05-01 DIAGNOSIS — E669 Obesity, unspecified: Secondary | ICD-10-CM | POA: Diagnosis not present

## 2022-05-01 DIAGNOSIS — R7303 Prediabetes: Secondary | ICD-10-CM | POA: Diagnosis not present

## 2022-05-01 DIAGNOSIS — Z6834 Body mass index (BMI) 34.0-34.9, adult: Secondary | ICD-10-CM | POA: Diagnosis not present

## 2022-05-01 DIAGNOSIS — Z6835 Body mass index (BMI) 35.0-35.9, adult: Secondary | ICD-10-CM | POA: Insufficient documentation

## 2022-05-01 DIAGNOSIS — R5383 Other fatigue: Secondary | ICD-10-CM | POA: Insufficient documentation

## 2022-05-01 DIAGNOSIS — Z1331 Encounter for screening for depression: Secondary | ICD-10-CM

## 2022-05-01 DIAGNOSIS — R0602 Shortness of breath: Secondary | ICD-10-CM | POA: Diagnosis not present

## 2022-05-02 LAB — LIPID PANEL WITH LDL/HDL RATIO
Cholesterol, Total: 181 mg/dL (ref 100–199)
HDL: 52 mg/dL (ref >39)
LDL Chol Calc (NIH): 112 mg/dL — ABNORMAL HIGH (ref 0–99)
LDL/HDL Ratio: 2.2 ratio (ref 0.0–3.2)
Triglycerides: 91 mg/dL (ref 0–149)
VLDL Cholesterol Cal: 17 mg/dL (ref 5–40)

## 2022-05-02 LAB — INSULIN, RANDOM: INSULIN: 12.1 u[IU]/mL (ref 2.6–24.9)

## 2022-05-06 NOTE — Progress Notes (Unsigned)
Chief Complaint:   OBESITY Janice Hutchinson (MR# [id number]) is a 71 y.o. female who presents for evaluation and treatment of obesity and related comorbidities. Current BMI is Body mass index is 34.77 kg/m. Janice Hutchinson has been struggling with her weight for many years and has been unsuccessful in either losing weight, maintaining weight loss, or reaching her healthy weight goal.  Janice Hutchinson is currently in the action stage of change and ready to dedicate time achieving and maintaining a healthier weight. Janice Hutchinson is interested in becoming our patient and working on intensive lifestyle modifications including (but not limited to) diet and exercise for weight loss.  Janice Hutchinson is down 2 lbs since 04/17/2022. She is walking for 40-45 minutes 4 times per week (10,000 steps). patient has been skipping meals and she is on 2 beta-blockers.  She has lost weight with increased frequency in the last 2 weeks.    Janice Hutchinson's habits were reviewed today and are as follows: Her family eats meals together, she thinks her family will eat healthier with her, her desired weight loss is 40 lbs, she started gaining weight after the pandemic, her heaviest weight ever was 217 pounds, she snacks frequently in the evenings, she skips meals frequently, she is frequently drinking liquids with calories, she frequently eats larger portions than normal, and she struggles with emotional eating.  Depression Screen Janice Hutchinson's Food and Mood (modified PHQ-9) score was 4.     05/01/2022   10:41 AM  Depression screen PHQ 2/9  Decreased Interest 1  Down, Depressed, Hopeless 0  PHQ - 2 Score 1  Altered sleeping 1  Tired, decreased energy 1  Change in appetite 1  Feeling bad or failure about yourself  0  Trouble concentrating 0  Moving slowly or fidgety/restless 0  Suicidal thoughts 0  PHQ-9 Score 4  Difficult doing work/chores Not difficult at all   Subjective:   1. Other fatigue Janice Hutchinson admits to daytime somnolence and admits to waking  up still tired. Patient has a history of symptoms of daytime fatigue and morning fatigue. Janice Hutchinson generally gets 6+ hours of sleep per night, and states that she has generally restful sleep. Snoring is present. Apneic episodes are present. Epworth Sleepiness Score is 6.  EKG without acute ischemic changes or chamber enlargement.  2. SOB (shortness of breath) Selma notes increasing shortness of breath with exercising and seems to be worsening over time with weight gain. She notes getting out of breath sooner with activity than she used to. This has not gotten worse recently. Lyn denies shortness of breath at rest or orthopnea.  3. Essential hypertension Janice Hutchinson's blood pressure is worsening and it is uncontrolled.  She is on 2 beta-blockers.  She has a history of palpitations.  Beta-blockers may be decreasing RMR which is lower than expected.  She is asymptomatic.  4. Prediabetes Janice Hutchinson is prediabetic by history.  I was unable to preview outside labs.  She was counseled on the disease state and risk of progression.  Assessment/Plan:   1. Other fatigue Janice Hutchinson does feel that her weight is causing her energy to be lower than it should be. Fatigue may be related to obesity, depression or many other causes. Labs will be ordered, and in the meanwhile, [name] will focus on self care including making healthy food choices, increasing physical activity and focusing on stress reduction.  - CBC with Differential/Platelet; Future - Comprehensive metabolic panel; Future - Lipid Panel With LDL/HDL Ratio - Vitamin B12 - VITAMIN D 25  Hydroxy (Vit-D Deficiency, Fractures); Future - TSH + free T4  2. SOB (shortness of breath) [name] does feel that she gets out of breath more easily that she used to when she exercises. [name]'s shortness of breath appears to be obesity related and exercise induced. She has agreed to work on weight loss and gradually increase exercise to treat her exercise induced shortness of breath.  Will continue to monitor closely.  3. Essential hypertension Janice Hutchinson is to check her blood pressure at home in the morning and p.m. she is to schedule an appointment with her PCP for medication titration and also to determine need for dual beta blocker therapy. 4. Prediabetes We will check labs today.  Janice Hutchinson will work on her weight loss therapy.  - Hemoglobin A1c; Future - Insulin, random  5. Depression screening Janice Hutchinson had a negative depression screening. Depression is commonly associated with obesity and often results in emotional eating behaviors.   6. Class 1 obesity without serious comorbidity with body mass index (BMI) of 34.0 to 34.9 in adult, unspecified obesity type Unknown is currently in the action stage of change and her goal is to continue with weight loss efforts. I recommend Janice Hutchinson begin the structured treatment plan as follows:  She has agreed to the Category 2 Plan.  Exercise goals: As is.   Isocaloric meal plan. She has started weight loss process by increasing meal intake.  Behavioral modification strategies: increasing lean protein intake, increasing water intake, no skipping meals, meal planning and cooking strategies, keeping healthy foods in the home, better snacking choices, avoiding temptations, and planning for success.  She was informed of the importance of frequent follow-up visits to maximize her success with intensive lifestyle modifications for her multiple health conditions. She was informed we would discuss her lab results at her next visit unless there is a critical issue that needs to be addressed sooner. Janice Hutchinson agreed to keep her next visit at the agreed upon time to discuss these results.  Objective:   Blood pressure (!) 169/83, pulse (!) 54, temperature 98.2 F (36.8 C), height 5\' 6"  (1.676 m), weight 215 lb 6.4 oz (97.7 kg), SpO2 98 %. Body mass index is 34.77 kg/m.  EKG: Normal sinus rhythm, rate 51 BPM (bradycardia).  Indirect Calorimeter completed  today shows a VO2 of 191 and a REE of 1310.  Her calculated basal metabolic rate is 2841 thus her basal metabolic rate is worse than expected.  General: Cooperative, alert, well developed, in no acute distress. HEENT: Conjunctivae and lids unremarkable. Cardiovascular: Regular rhythm.  Lungs: Normal work of breathing. Neurologic: No focal deficits.   Lab Results  Component Value Date   CREATININE 0.90 03/17/2017   BUN 11 03/17/2017   NA 140 03/17/2017   K 4.3 03/17/2017   CL 105 03/17/2017   CO2 29 03/17/2017   Lab Results  Component Value Date   ALT 22 05/04/2014   AST 18 05/04/2014   ALKPHOS 70 05/04/2014   BILITOT 1.8 (H) 05/04/2014   No results found for: "HGBA1C" Lab Results  Component Value Date   INSULIN 12.1 05/01/2022   Lab Results  Component Value Date   TSH 0.58 05/04/2014   Lab Results  Component Value Date   CHOL 181 05/01/2022   HDL 52 05/01/2022   LDLCALC 112 (H) 05/01/2022   TRIG 91 05/01/2022   CHOLHDL 4 05/04/2014   Lab Results  Component Value Date   WBC 10.8 (H) 03/26/2017   HGB 14.1 03/26/2017   HCT  42.0 03/26/2017   MCV 76.1 (L) 03/26/2017   PLT 228 03/26/2017   No results found for: "IRON", "TIBC", "FERRITIN"  Attestation Statements:   Reviewed by clinician on day of visit: allergies, medications, problem list, medical history, surgical history, family history, social history, and previous encounter notes.  Time spent on visit including pre-visit chart review and post-visit charting and care was 40 minutes.   I, Burt Knack, am acting as transcriptionist for Worthy Rancher, MD.  I have reviewed the above documentation for accuracy and completeness, and I agree with the above. Worthy Rancher, MD

## 2022-05-12 ENCOUNTER — Ambulatory Visit (INDEPENDENT_AMBULATORY_CARE_PROVIDER_SITE_OTHER): Payer: Medicare Other | Admitting: Internal Medicine

## 2022-05-12 DIAGNOSIS — I10 Essential (primary) hypertension: Secondary | ICD-10-CM | POA: Diagnosis not present

## 2022-05-15 ENCOUNTER — Ambulatory Visit (INDEPENDENT_AMBULATORY_CARE_PROVIDER_SITE_OTHER): Payer: Medicare Other | Admitting: Internal Medicine

## 2022-05-15 ENCOUNTER — Encounter (INDEPENDENT_AMBULATORY_CARE_PROVIDER_SITE_OTHER): Payer: Self-pay | Admitting: Internal Medicine

## 2022-05-15 VITALS — BP 144/81 | HR 75 | Temp 98.8°F | Ht 66.0 in | Wt 213.6 lb

## 2022-05-15 DIAGNOSIS — E7849 Other hyperlipidemia: Secondary | ICD-10-CM

## 2022-05-15 DIAGNOSIS — Z6834 Body mass index (BMI) 34.0-34.9, adult: Secondary | ICD-10-CM

## 2022-05-15 DIAGNOSIS — I10 Essential (primary) hypertension: Secondary | ICD-10-CM | POA: Diagnosis not present

## 2022-05-15 DIAGNOSIS — E669 Obesity, unspecified: Secondary | ICD-10-CM

## 2022-05-18 NOTE — Progress Notes (Deleted)
The 10-year ASCVD risk score (Arnett DK, et al., 2019) is: 14.1%   Values used to calculate the score:     Age: 71 years     Sex: Female     Is Non-Hispanic African American: Yes     Diabetic: No     Tobacco smoker: No     Systolic Blood Pressure: 191 mmHg     Is BP treated: Yes     HDL Cholesterol: 52 mg/dL     Total Cholesterol: 181 mg/dL

## 2022-05-19 DIAGNOSIS — E7849 Other hyperlipidemia: Secondary | ICD-10-CM | POA: Insufficient documentation

## 2022-05-25 NOTE — Progress Notes (Signed)
Chief Complaint:   OBESITY Janice Hutchinson is here to discuss her progress with her obesity treatment plan along with follow-up of her obesity related diagnoses. Janice Hutchinson is on the Category 2 Plan and states she is following her eating plan approximately 100% of the time. Janice Hutchinson states she is walking for 40-60 minutes 3 times per week.  Today's visit was #: 2 Starting weight: 215 lbs Starting date: 05/01/2022 Today's weight: 213 lbs Today's date: 05/15/2022 Total lbs lost to date: 2 Total lbs lost since last in-office visit: 2  Interim History: Janice Hutchinson has been journaling, and she is missing protein with breakfast at times.  She reports adequate control of appetite.  She spoke with her PCP and she is off metoprolol.  Some of her blood work that was ordered was not completed.  She has been making some substitutions, she has been eating regular crackers.  She has reduced fried foods.  She finds adhering to the meal plan difficult at times.  She is not used to eating breakfast.  Subjective:   1. Hypertension, essential Janice Hutchinson's blood pressure is worsening and its not at goal.  She has been off metoprolol.  She is on lisinopril, hydrochlorothiazide, and ARB.  2. Other hyperlipidemia Janice Hutchinson's last LDL was 112, triglycerides 181, and ASCVD score 14.1%.  She will benefit for CV risk reduction.  She will work on her blood pressure control with her PCP.  I discussed labs with the patient today.  Assessment/Plan:   1. Hypertension, essential Janice Hutchinson was counseled on the goal of <130/80 and risks. She will monitor at home and will contact her PCP to adjust medications if her blood pressure is above target. She will continue with her weight loss therapy.   2. Other hyperlipidemia Janice Hutchinson will continue with her weight loss therapy and decrease saturated fats in her diet.   3. Obesity with current BMI 34.5 Janice Hutchinson is currently in the action stage of change. As such, her goal is to continue with weight loss efforts.  She has agreed to the Category 2 Plan.   Exercise goals: As is, and incorporate strengthening 2-3 times per week.   Behavioral modification strategies: increasing lean protein intake, decreasing simple carbohydrates, meal planning and cooking strategies, avoiding temptations, and planning for success.  Janice Hutchinson has agreed to follow-up with our clinic in 2 weeks. She was informed of the importance of frequent follow-up visits to maximize her success with intensive lifestyle modifications for her multiple health conditions.   Objective:   Blood pressure (!) 144/81, pulse 75, temperature 98.8 F (37.1 C), height 5\' 6"  (1.676 m), weight 213 lb 9.6 oz (96.9 kg), SpO2 99 %. Body mass index is 34.48 kg/m.  General: Cooperative, alert, well developed, in no acute distress. HEENT: Conjunctivae and lids unremarkable. Cardiovascular: Regular rhythm.  Lungs: Normal work of breathing. Neurologic: No focal deficits.   Lab Results  Component Value Date   CREATININE 0.90 03/17/2017   BUN 11 03/17/2017   NA 140 03/17/2017   K 4.3 03/17/2017   CL 105 03/17/2017   CO2 29 03/17/2017   Lab Results  Component Value Date   ALT 22 05/04/2014   AST 18 05/04/2014   ALKPHOS 70 05/04/2014   BILITOT 1.8 (H) 05/04/2014   No results found for: "HGBA1C" Lab Results  Component Value Date   INSULIN 12.1 05/01/2022   Lab Results  Component Value Date   TSH 0.58 05/04/2014   Lab Results  Component Value Date   CHOL 181  05/01/2022   HDL 52 05/01/2022   LDLCALC 112 (H) 05/01/2022   TRIG 91 05/01/2022   CHOLHDL 4 05/04/2014   Lab Results  Component Value Date   VD25OH 28.71 (L) 05/04/2014   VD25OH 34 05/04/2013   VD25OH 30 02/13/2011   Lab Results  Component Value Date   WBC 10.8 (H) 03/26/2017   HGB 14.1 03/26/2017   HCT 42.0 03/26/2017   MCV 76.1 (L) 03/26/2017   PLT 228 03/26/2017   No results found for: "IRON", "TIBC", "FERRITIN"  Attestation Statements:   Reviewed by clinician on  day of visit: allergies, medications, problem list, medical history, surgical history, family history, social history, and previous encounter notes.  Time spent on visit including pre-visit chart review and post-visit care and charting was 40 minutes.   Wilhemena Durie, am acting as transcriptionist for Thomes Dinning, MD.  I have reviewed the above documentation for accuracy and completeness, and I agree with the above. -Thomes Dinning, MD

## 2022-05-29 ENCOUNTER — Ambulatory Visit (INDEPENDENT_AMBULATORY_CARE_PROVIDER_SITE_OTHER): Payer: Medicare Other | Admitting: Internal Medicine

## 2022-05-29 ENCOUNTER — Encounter (INDEPENDENT_AMBULATORY_CARE_PROVIDER_SITE_OTHER): Payer: Self-pay | Admitting: Internal Medicine

## 2022-05-29 VITALS — BP 179/80 | HR 56 | Temp 98.1°F | Ht 66.0 in | Wt 216.2 lb

## 2022-05-29 DIAGNOSIS — R7303 Prediabetes: Secondary | ICD-10-CM | POA: Diagnosis not present

## 2022-05-29 DIAGNOSIS — I10 Essential (primary) hypertension: Secondary | ICD-10-CM

## 2022-05-29 DIAGNOSIS — E669 Obesity, unspecified: Secondary | ICD-10-CM

## 2022-05-29 DIAGNOSIS — Z6834 Body mass index (BMI) 34.0-34.9, adult: Secondary | ICD-10-CM

## 2022-05-30 LAB — CMP14+EGFR
ALT: 22 IU/L (ref 0–32)
AST: 21 IU/L (ref 0–40)
Albumin/Globulin Ratio: 1.3 (ref 1.2–2.2)
Albumin: 4.2 g/dL (ref 3.8–4.8)
Alkaline Phosphatase: 93 IU/L (ref 44–121)
BUN/Creatinine Ratio: 23 (ref 12–28)
BUN: 28 mg/dL — ABNORMAL HIGH (ref 8–27)
Bilirubin Total: 0.8 mg/dL (ref 0.0–1.2)
CO2: 24 mmol/L (ref 20–29)
Calcium: 10.1 mg/dL (ref 8.7–10.3)
Chloride: 103 mmol/L (ref 96–106)
Creatinine, Ser: 1.23 mg/dL — ABNORMAL HIGH (ref 0.57–1.00)
Globulin, Total: 3.2 g/dL (ref 1.5–4.5)
Glucose: 90 mg/dL (ref 70–99)
Potassium: 5.2 mmol/L (ref 3.5–5.2)
Sodium: 139 mmol/L (ref 134–144)
Total Protein: 7.4 g/dL (ref 6.0–8.5)
eGFR: 47 mL/min/1.73 — ABNORMAL LOW

## 2022-05-30 LAB — CBC WITH DIFFERENTIAL
Basophils Absolute: 0 10*3/uL (ref 0.0–0.2)
Basos: 1 %
EOS (ABSOLUTE): 0.1 10*3/uL (ref 0.0–0.4)
Eos: 2 %
Hematocrit: 47.4 % — ABNORMAL HIGH (ref 34.0–46.6)
Hemoglobin: 15.3 g/dL (ref 11.1–15.9)
Immature Grans (Abs): 0 10*3/uL (ref 0.0–0.1)
Immature Granulocytes: 0 %
Lymphocytes Absolute: 2.4 10*3/uL (ref 0.7–3.1)
Lymphs: 49 %
MCH: 25.9 pg — ABNORMAL LOW (ref 26.6–33.0)
MCHC: 32.3 g/dL (ref 31.5–35.7)
MCV: 80 fL (ref 79–97)
Monocytes Absolute: 0.5 10*3/uL (ref 0.1–0.9)
Monocytes: 10 %
Neutrophils Absolute: 1.9 10*3/uL (ref 1.4–7.0)
Neutrophils: 38 %
RBC: 5.91 x10E6/uL — ABNORMAL HIGH (ref 3.77–5.28)
RDW: 14.3 % (ref 11.7–15.4)
WBC: 4.9 10*3/uL (ref 3.4–10.8)

## 2022-05-30 LAB — HEMOGLOBIN A1C
Est. average glucose Bld gHb Est-mCnc: 88 mg/dL
Hgb A1c MFr Bld: 4.7 % — ABNORMAL LOW (ref 4.8–5.6)

## 2022-05-30 LAB — VITAMIN D 25 HYDROXY (VIT D DEFICIENCY, FRACTURES): Vit D, 25-Hydroxy: 14.1 ng/mL — ABNORMAL LOW (ref 30.0–100.0)

## 2022-06-02 ENCOUNTER — Telehealth (INDEPENDENT_AMBULATORY_CARE_PROVIDER_SITE_OTHER): Payer: Self-pay | Admitting: Internal Medicine

## 2022-06-02 NOTE — Telephone Encounter (Signed)
Spoke with Janice Hutchinson regarding her recent labs which showed a decrease in GFR.  She is on hydrochlorothiazide and spironolactone for blood pressure management.  She also had an elevated BUN so I suspect this is due to prerenal azotemia.  She denies a history of kidney disease.  She also has been taking ibuprofen for arthritis.  She is followed by Dr. Laqueta Due.  I tried calling office on 2 occasions but was unsuccessful.  Patient instructed to discontinue the use of ibuprofen to improve hydration and I will repeat her CMP at her follow-up in 10 days.  She has a follow-up with her PCP for medication adjustment as her blood pressure has been elevated.  I recommended that she print out her test results and share with her my concern.  I will fax test results and note to her office.

## 2022-06-09 DIAGNOSIS — E559 Vitamin D deficiency, unspecified: Secondary | ICD-10-CM | POA: Diagnosis not present

## 2022-06-09 DIAGNOSIS — I10 Essential (primary) hypertension: Secondary | ICD-10-CM | POA: Diagnosis not present

## 2022-06-09 DIAGNOSIS — Z0000 Encounter for general adult medical examination without abnormal findings: Secondary | ICD-10-CM | POA: Diagnosis not present

## 2022-06-09 DIAGNOSIS — Z79899 Other long term (current) drug therapy: Secondary | ICD-10-CM | POA: Diagnosis not present

## 2022-06-10 NOTE — Progress Notes (Unsigned)
Chief Complaint:   OBESITY Janice Hutchinson is here to discuss her progress with her obesity treatment plan along with follow-up of her obesity related diagnoses. Janice Hutchinson is on the Category 2 Plan and states she is following her eating plan approximately 100% of the time. Janice Hutchinson states she is doing resistance weights 15 minutes 7 times per week, and walking for 40-60 minutes 7 times per week.  Today's visit was #: 3 Starting weight: 215 lbs Starting date: 05/01/2022 Today's weight: 216 lbs Today's date: 05/29/2022 Total lbs lost to date: 0 Total lbs lost since last in-office visit: 0  Interim History: Janice Hutchinson is attempting to follow the prescribed plan, but she has been making some modifications and at times exceeding her calorie goal.  She has been journaling in a notebook, but not using the app.  She is feeling frustrated as she did not expect to gain weight.  She is no longer skipping meals.  Subjective:   1. Essential hypertension Janice Hutchinson's blood pressure is uncontrolled. Reviewed home blood pressure measurements. Her systolic blood pressure ranges between 130-150's.  Assessment/Plan:   1. Essential hypertension Janice Hutchinson will follow-up with her PCP in 1-2 weeks for medication adjustment.    2. Obesity with current BMI of 34.9 Janice Hutchinson is currently in the action stage of change. As such, her goal is to continue with weight loss efforts. She has agreed to following a lower carbohydrate, vegetable and lean protein rich diet plan.   Reviewed the low carbohydrate plan alternatives with the patient which she agrees to try.  I also recommended journaling using the app for better accuracy.  We will check labs today.   - CMP14+EGFR - CBC With Differential - Hemoglobin A1c - VITAMIN D 25 Hydroxy (Vit-D Deficiency, Fractures)  Exercise goals: As is.   Behavioral modification strategies: increasing lean protein intake, planning for success, and keeping a strict food journal.  Janice Hutchinson has agreed to  follow-up with our clinic in 2 weeks. She was informed of the importance of frequent follow-up visits to maximize her success with intensive lifestyle modifications for her multiple health conditions.   Janice Hutchinson was informed we would discuss her lab results at her next visit unless there is a critical issue that needs to be addressed sooner. Janice Hutchinson agreed to keep her next visit at the agreed upon time to discuss these results.  Objective:   Blood pressure (!) 179/80, pulse (!) 56, temperature 98.1 F (36.7 C), height _0  (1.676 m), weight 216 lb 3.2 oz (98.1 kg), SpO2 100 %. Body mass index is 34.9 kg/m.  General: Cooperative, alert, well developed, in no acute distress. HEENT: Conjunctivae and lids unremarkable. Cardiovascular: Regular rhythm.  Lungs: Normal work of breathing. Neurologic: No focal deficits.   Lab Results  Component Value Date   CREATININE 1.23 (H) 05/29/2022   BUN 28 (H) 05/29/2022   NA 139 05/29/2022   K 5.2 05/29/2022   CL 103 05/29/2022   CO2 24 05/29/2022   Lab Results  Component Value Date   ALT 22 05/29/2022   AST 21 05/29/2022   ALKPHOS 93 05/29/2022   BILITOT 0.8 05/29/2022   Lab Results  Component Value Date   HGBA1C 4.7 (L) 05/29/2022   Lab Results  Component Value Date   INSULIN 12.1 05/01/2022   Lab Results  Component Value Date   TSH 0.58 05/04/2014   Lab Results  Component Value Date   CHOL 181 05/01/2022   HDL 52 05/01/2022   LDLCALC 112 (H)  05/01/2022   TRIG 91 05/01/2022   CHOLHDL 4 05/04/2014   Lab Results  Component Value Date   VD25OH 14.1 (L) 05/29/2022   VD25OH 28.71 (L) 05/04/2014   VD25OH 34 05/04/2013   Lab Results  Component Value Date   WBC 4.9 05/29/2022   HGB 15.3 05/29/2022   HCT 47.4 (H) 05/29/2022   MCV 80 05/29/2022   PLT 228 03/26/2017   No results found for: "IRON", "TIBC", "FERRITIN"  Attestation Statements:   Reviewed by clinician on day of visit: allergies, medications, problem list, medical  history, surgical history, family history, social history, and previous encounter notes.  Time spent on visit including pre-visit chart review and post-visit care and charting was 20 minutes.   I, Trixie Dredge, am acting as transcriptionist for Thomes Dinning, MD.  I have reviewed the above documentation for accuracy and completeness, and I agree with the above. -  ***

## 2022-06-12 ENCOUNTER — Encounter (INDEPENDENT_AMBULATORY_CARE_PROVIDER_SITE_OTHER): Payer: Self-pay | Admitting: Internal Medicine

## 2022-06-12 ENCOUNTER — Ambulatory Visit (INDEPENDENT_AMBULATORY_CARE_PROVIDER_SITE_OTHER): Payer: Medicare Other | Admitting: Internal Medicine

## 2022-06-12 VITALS — BP 153/88 | HR 54 | Temp 98.1°F | Ht 66.0 in | Wt 207.8 lb

## 2022-06-12 DIAGNOSIS — R7303 Prediabetes: Secondary | ICD-10-CM | POA: Insufficient documentation

## 2022-06-12 DIAGNOSIS — E669 Obesity, unspecified: Secondary | ICD-10-CM | POA: Diagnosis not present

## 2022-06-12 DIAGNOSIS — I10 Essential (primary) hypertension: Secondary | ICD-10-CM

## 2022-06-12 DIAGNOSIS — R944 Abnormal results of kidney function studies: Secondary | ICD-10-CM | POA: Insufficient documentation

## 2022-06-12 DIAGNOSIS — Z6833 Body mass index (BMI) 33.0-33.9, adult: Secondary | ICD-10-CM

## 2022-06-26 ENCOUNTER — Ambulatory Visit (INDEPENDENT_AMBULATORY_CARE_PROVIDER_SITE_OTHER): Payer: Medicare Other | Admitting: Internal Medicine

## 2022-06-26 ENCOUNTER — Encounter (INDEPENDENT_AMBULATORY_CARE_PROVIDER_SITE_OTHER): Payer: Self-pay | Admitting: Internal Medicine

## 2022-06-26 VITALS — BP 146/84 | HR 56 | Temp 98.4°F | Ht 66.0 in | Wt 207.0 lb

## 2022-06-26 DIAGNOSIS — Z6833 Body mass index (BMI) 33.0-33.9, adult: Secondary | ICD-10-CM

## 2022-06-26 DIAGNOSIS — E559 Vitamin D deficiency, unspecified: Secondary | ICD-10-CM

## 2022-06-26 DIAGNOSIS — R944 Abnormal results of kidney function studies: Secondary | ICD-10-CM | POA: Diagnosis not present

## 2022-06-26 DIAGNOSIS — R7303 Prediabetes: Secondary | ICD-10-CM

## 2022-06-26 DIAGNOSIS — I10 Essential (primary) hypertension: Secondary | ICD-10-CM

## 2022-06-26 DIAGNOSIS — E669 Obesity, unspecified: Secondary | ICD-10-CM

## 2022-06-30 NOTE — Progress Notes (Signed)
Chief Complaint:   OBESITY Janice Hutchinson is here to discuss her progress with her obesity treatment plan along with follow-up of her obesity related diagnoses. Janice Hutchinson is on following a lower carbohydrate, vegetable and lean protein rich diet plan and states she is following her eating plan approximately 100% of the time. Janice Hutchinson states she is walking and doing resistance for 10 minutes 5 times per week.  Today's visit was #: 4 Starting weight: 215 lbs Starting date: 05/01/2022 Today's weight: 207 lbs Today's date: 06/12/2022 Total lbs lost to date: 8 Total lbs lost since last in-office visit: 9  Interim History: Janice Hutchinson has been doing well on her new low carbohydrates plan. She reports adequate satiety, satiation, and she inquires about treats and for advice when eating out. She has increased her water intake and she has been tracking her calories and protein, and she is at goal most of the time.   Subjective:   1. Essential hypertension Ryliegh's home blood pressure monitoring ranges between 130's-140's systolic. Her HCTZ was decreased by her PCP because of decreased GFR and orthostasis. She may need additional agents.   2. Decreased GFR I reviewed recent labs from her PCP's office. Janice Hutchinson's PCP reports GFR has increased but is still below baseline. Her HCTZ was decreased as well.   3. Pre-diabetes Janice Hutchinson's A1c was improved at 4.7. I discussed labs with the patient today.   Assessment/Plan:   1. Essential hypertension Patient's records was reviewed. Deina will continue monitoring her blood pressure at home and will continue her current medications.   2. Decreased GFR Records will be requested. Will consider repeating in 4 weeks.   3. Pre-diabetes Janice Hutchinson will continue working on her weight loss therapy.   4. Obesity with current BMI of 33.5 Janice Hutchinson is currently in the action stage of change. As such, her goal is to continue with weight loss efforts. She has agreed to following a lower  carbohydrate, vegetable and lean protein rich diet plan.   Exercise goals: As is.   Behavioral modification strategies: increasing water intake, meal planning and cooking strategies, keeping healthy foods in the home, dealing with family or coworker sabotage, and planning for success.  Janice Hutchinson has agreed to follow-up with our clinic in 2 to 3 weeks. She was informed of the importance of frequent follow-up visits to maximize her success with intensive lifestyle modifications for her multiple health conditions.   Objective:   Blood pressure (!) 153/88, pulse (!) 54, temperature 98.1 F (36.7 C), height 5\' 6"  (1.676 m), weight 207 lb 12.8 oz (94.3 kg), SpO2 99 %. Body mass index is 33.54 kg/m.  General: Cooperative, alert, well developed, in no acute distress. HEENT: Conjunctivae and lids unremarkable. Cardiovascular: Regular rhythm.  Lungs: Normal work of breathing. Neurologic: No focal deficits.   Lab Results  Component Value Date   CREATININE 1.23 (H) 05/29/2022   BUN 28 (H) 05/29/2022   NA 139 05/29/2022   K 5.2 05/29/2022   CL 103 05/29/2022   CO2 24 05/29/2022   Lab Results  Component Value Date   ALT 22 05/29/2022   AST 21 05/29/2022   ALKPHOS 93 05/29/2022   BILITOT 0.8 05/29/2022   Lab Results  Component Value Date   HGBA1C 4.7 (L) 05/29/2022   Lab Results  Component Value Date   INSULIN 12.1 05/01/2022   Lab Results  Component Value Date   TSH 0.58 05/04/2014   Lab Results  Component Value Date   CHOL 181 05/01/2022  HDL 52 05/01/2022   LDLCALC 112 (H) 05/01/2022   TRIG 91 05/01/2022   CHOLHDL 4 05/04/2014   Lab Results  Component Value Date   VD25OH 14.1 (L) 05/29/2022   VD25OH 28.71 (L) 05/04/2014   VD25OH 34 05/04/2013   Lab Results  Component Value Date   WBC 4.9 05/29/2022   HGB 15.3 05/29/2022   HCT 47.4 (H) 05/29/2022   MCV 80 05/29/2022   PLT 228 03/26/2017   No results found for: "IRON", "TIBC", "FERRITIN"  Attestation  Statements:   Reviewed by clinician on day of visit: allergies, medications, problem list, medical history, surgical history, family history, social history, and previous encounter notes.  Time spent on visit including pre-visit chart review and post-visit care and charting was 20 minutes.   Wilhemena Durie, am acting as transcriptionist for Thomes Dinning, MD.  I have reviewed the above documentation for accuracy and completeness, and I agree with the above. -Thomes Dinning, MD

## 2022-07-01 DIAGNOSIS — Z79899 Other long term (current) drug therapy: Secondary | ICD-10-CM | POA: Diagnosis not present

## 2022-07-08 NOTE — Progress Notes (Signed)
Chief Complaint:   OBESITY Janice Hutchinson is here to discuss her progress with her obesity treatment plan along with follow-up of her obesity related diagnoses. Janice Hutchinson is on following a lower carbohydrate, vegetable and lean protein rich diet plan and states she is following her eating plan approximately 100% of the time. Janice Hutchinson states she is walking for 40 minutes 2 times per week.  Today's visit was #: 5 Starting weight: 215 lbs Starting date: 05/01/2022 Today's weight: 207 lbs Today's date: 06/26/2022 Total lbs lost to date: 8 Total lbs lost since last in-office visit: 0  Interim History: Janice Hutchinson is disappointed on her weight loss.  She has been journaling and following the low carbohydrate plan, and weight neutral.  She initially had lost 8-9 pounds.  She denies deviation or changes.  No abnormal cravings or eating patterns.  She notes decreased physical activity.  She has a lower than expected IC.  Worried about blood pressure control.  Subjective:   1. Essential hypertension I reviewed [facility] blood pressure log; elevations of 150-160's at home. Her ARB was increased by her PCP recently. She will need an additional agent.  2. Decreased GFR Janice Hutchinson's GFR was reportedly improved per the patient. She does not have most recent labs.   3. Pre-diabetes Janice Hutchinson's last A1c was 4.7.  4. Vitamin D deficiency Janice Hutchinson's last Vitamin D level was 14.1.  Assessment/Plan:   1. Essential hypertension We will continue to monitor and work with her PCP on intensification of therapy.   2. Decreased GFR Janice Hutchinson was advised of avoidance of nephrotoxins and she is to increase her hydration.   3. Pre-diabetes Janice Hutchinson will continue to work on weight loss, exercise, and decreasing simple carbohydrates to help decrease the risk of diabetes.   4. Vitamin D deficiency Janice Hutchinson will continue her high dose Vitamin D.   5. Obesity, current BMI 33.5 Janice Hutchinson is currently in the action stage of change. As such, her goal is  to continue with weight loss efforts. She has agreed to following a lower carbohydrate, vegetable and lean protein rich diet plan.   Will not reduce calorie plan, achieving desired calorie range. She overall feel better and pre-diabetes has improved.   Exercise goals: As is, advised on increasing exercise.   Behavioral modification strategies: increasing lean protein intake, no skipping meals, meal planning and cooking strategies, better snacking choices, and avoiding temptations.  Janice Hutchinson has agreed to follow-up with our clinic in 2 weeks. She was informed of the importance of frequent follow-up visits to maximize her success with intensive lifestyle modifications for her multiple health conditions.   Objective:   Blood pressure (!) 146/84, pulse (!) 56, temperature 98.4 F (36.9 C), height 5\' 6"  (1.676 m), weight 207 lb (93.9 kg), SpO2 99 %. Body mass index is 33.41 kg/m.  General: Cooperative, alert, well developed, in no acute distress. HEENT: Conjunctivae and lids unremarkable. Cardiovascular: Regular rhythm.  Lungs: Normal work of breathing. Neurologic: No focal deficits.   Lab Results  Component Value Date   CREATININE 1.23 (H) 05/29/2022   BUN 28 (H) 05/29/2022   NA 139 05/29/2022   K 5.2 05/29/2022   CL 103 05/29/2022   CO2 24 05/29/2022   Lab Results  Component Value Date   ALT 22 05/29/2022   AST 21 05/29/2022   ALKPHOS 93 05/29/2022   BILITOT 0.8 05/29/2022   Lab Results  Component Value Date   HGBA1C 4.7 (L) 05/29/2022   Lab Results  Component Value Date  INSULIN 12.1 05/01/2022   Lab Results  Component Value Date   TSH 0.58 05/04/2014   Lab Results  Component Value Date   CHOL 181 05/01/2022   HDL 52 05/01/2022   LDLCALC 112 (H) 05/01/2022   TRIG 91 05/01/2022   CHOLHDL 4 05/04/2014   Lab Results  Component Value Date   VD25OH 14.1 (L) 05/29/2022   VD25OH 28.71 (L) 05/04/2014   VD25OH 34 05/04/2013   Lab Results  Component Value Date    WBC 4.9 05/29/2022   HGB 15.3 05/29/2022   HCT 47.4 (H) 05/29/2022   MCV 80 05/29/2022   PLT 228 03/26/2017   No results found for: "IRON", "TIBC", "FERRITIN"  Attestation Statements:   Reviewed by clinician on day of visit: allergies, medications, problem list, medical history, surgical history, family history, social history, and previous encounter notes.  Time spent on visit including pre-visit chart review and post-visit care and charting was 20 minutes.   Trude Mcburney, am acting as transcriptionist for Worthy Rancher, MD.  I have reviewed the above documentation for accuracy and completeness, and I agree with the above. -Worthy Rancher, MD

## 2022-07-10 ENCOUNTER — Ambulatory Visit (INDEPENDENT_AMBULATORY_CARE_PROVIDER_SITE_OTHER): Payer: Medicare Other | Admitting: Internal Medicine

## 2022-07-10 VITALS — BP 142/84 | HR 58 | Temp 98.5°F | Ht 66.0 in | Wt 203.0 lb

## 2022-07-10 DIAGNOSIS — Z6832 Body mass index (BMI) 32.0-32.9, adult: Secondary | ICD-10-CM | POA: Diagnosis not present

## 2022-07-10 DIAGNOSIS — E669 Obesity, unspecified: Secondary | ICD-10-CM | POA: Diagnosis not present

## 2022-07-10 DIAGNOSIS — R944 Abnormal results of kidney function studies: Secondary | ICD-10-CM

## 2022-07-10 DIAGNOSIS — I10 Essential (primary) hypertension: Secondary | ICD-10-CM

## 2022-07-10 DIAGNOSIS — Z1231 Encounter for screening mammogram for malignant neoplasm of breast: Secondary | ICD-10-CM | POA: Diagnosis not present

## 2022-07-10 NOTE — Progress Notes (Signed)
Chief Complaint:   OBESITY Janice Hutchinson is here to discuss her progress with her obesity treatment plan along with follow-up of her obesity related diagnoses. Janice Hutchinson is on following a lower carbohydrate, vegetable and lean protein rich diet plan and states she is following her eating plan approximately 98% of the time. Janice Hutchinson states she is walking 40 minutes 3-4 times per week.  Today's visit was #: 6 Starting weight: 215 lbs Starting date: 05/01/2022 Today's weight: 203 lbs Today's date: 07/10/2022 Total lbs lost to date: 12 Total lbs lost since last in-office visit: 4  Interim History: Janice Hutchinson presents today for follow-up.  She reports doing well.  She has good adherence to meal plan.  She has lost an additional 4 pounds for total of 14 pounds.  She reports adequate satiety and satiation.  She denies abnormal cravings or eating patterns.  She reports increased energy and close fitting looser.  Her blood pressure is also improving.  PCP discontinued the spironolactone due to decrease in GFR and has started her on amlodipine 2.5 mg a day.  She is noticing some ankle swelling but no other side effects.  She is monitoring her blood pressure at home and they have been trending down from 140 at times in the 120s.   Subjective:   1. Decreased GFR Improving. Patient is now off spironolactone. We reviewed labs from PCP. GFR is 54 and increased from 47.  2. Essential hypertension Reviewed home blood pressure monitoring and BP is improving but still above goal.  Assessment/Plan:   1. Decreased GFR Patient is scheduled for a repeat GFR in the near future. She has increased water intake and is avoiding nephrotoxins.  2. Essential hypertension Continue to monitor BP at home. Continue weight loss therapy, and increase amlodipine to 5 mg, if BP is not at goal in 7-10 days.  3. Obesity, current BMI 32.8 Janice Hutchinson is currently in the action stage of change. As such, her goal is to continue with weight  loss efforts. She has agreed to following a lower carbohydrate, vegetable and lean protein rich diet plan.   Exercise goals:  As is  Behavioral modification strategies: increasing lean protein intake, increasing water intake, meal planning and cooking strategies, holiday eating strategies , and avoiding temptations.  Janice Hutchinson has agreed to follow-up with our clinic in 3 weeks. She was informed of the importance of frequent follow-up visits to maximize her success with intensive lifestyle modifications for her multiple health conditions.   Objective:   Blood pressure (!) 142/84, pulse (!) 58, temperature 98.5 F (36.9 C), height 5\' 6"  (1.676 m), weight 203 lb (92.1 kg), SpO2 99 %. Body mass index is 32.77 kg/m.  General: Cooperative, alert, well developed, in no acute distress. HEENT: Conjunctivae and lids unremarkable. Cardiovascular: Regular rhythm.  Lungs: Normal work of breathing. Neurologic: No focal deficits.   Lab Results  Component Value Date   CREATININE 1.23 (H) 05/29/2022   BUN 28 (H) 05/29/2022   NA 139 05/29/2022   K 5.2 05/29/2022   CL 103 05/29/2022   CO2 24 05/29/2022   Lab Results  Component Value Date   ALT 22 05/29/2022   AST 21 05/29/2022   ALKPHOS 93 05/29/2022   BILITOT 0.8 05/29/2022   Lab Results  Component Value Date   HGBA1C 4.7 (L) 05/29/2022   Lab Results  Component Value Date   INSULIN 12.1 05/01/2022   Lab Results  Component Value Date   TSH 0.58 05/04/2014  Lab Results  Component Value Date   CHOL 181 05/01/2022   HDL 52 05/01/2022   LDLCALC 112 (H) 05/01/2022   TRIG 91 05/01/2022   CHOLHDL 4 05/04/2014   Lab Results  Component Value Date   VD25OH 14.1 (L) 05/29/2022   VD25OH 28.71 (L) 05/04/2014   VD25OH 34 05/04/2013   Lab Results  Component Value Date   WBC 4.9 05/29/2022   HGB 15.3 05/29/2022   HCT 47.4 (H) 05/29/2022   MCV 80 05/29/2022   PLT 228 03/26/2017    Attestation Statements:   Reviewed by clinician on  day of visit: allergies, medications, problem list, medical history, surgical history, family history, social history, and previous encounter notes.  Time spent on visit including pre-visit chart review and post-visit care and charting was 20 minutes.   I, Kyung Rudd, BS, CMA, am acting as transcriptionist for Worthy Rancher, MD.  I have reviewed the above documentation for accuracy and completeness, and I agree with the above. -Worthy Rancher, MD

## 2022-07-14 ENCOUNTER — Encounter (INDEPENDENT_AMBULATORY_CARE_PROVIDER_SITE_OTHER): Payer: Self-pay | Admitting: Internal Medicine

## 2022-07-14 DIAGNOSIS — Z79899 Other long term (current) drug therapy: Secondary | ICD-10-CM | POA: Diagnosis not present

## 2022-07-29 DIAGNOSIS — I10 Essential (primary) hypertension: Secondary | ICD-10-CM | POA: Diagnosis not present

## 2022-07-31 ENCOUNTER — Ambulatory Visit (INDEPENDENT_AMBULATORY_CARE_PROVIDER_SITE_OTHER): Payer: Medicare Other | Admitting: Internal Medicine

## 2022-07-31 ENCOUNTER — Encounter (INDEPENDENT_AMBULATORY_CARE_PROVIDER_SITE_OTHER): Payer: Self-pay | Admitting: Internal Medicine

## 2022-07-31 VITALS — BP 146/84 | HR 60 | Temp 98.1°F | Ht 66.0 in | Wt 203.0 lb

## 2022-07-31 DIAGNOSIS — Z6832 Body mass index (BMI) 32.0-32.9, adult: Secondary | ICD-10-CM

## 2022-07-31 DIAGNOSIS — I10 Essential (primary) hypertension: Secondary | ICD-10-CM

## 2022-07-31 DIAGNOSIS — R944 Abnormal results of kidney function studies: Secondary | ICD-10-CM

## 2022-07-31 DIAGNOSIS — E669 Obesity, unspecified: Secondary | ICD-10-CM | POA: Diagnosis not present

## 2022-07-31 NOTE — Progress Notes (Signed)
Chief Complaint:   OBESITY Janice Hutchinson is here to discuss her progress with her obesity treatment plan along with follow-up of her obesity related diagnoses. Janice Hutchinson is on following a lower carbohydrate, vegetable and lean protein rich diet plan and states she is following her eating plan approximately 90% of the time. Janice Hutchinson states she is walking for 40 minutes 4 times per week.  Today's visit was #: 7 Starting weight: 215 lbs Starting date: 05/01/2022 Today's weight: 203 lbs Today's date: 07/31/2022 Total lbs lost to date: 12 Total lbs lost since last in-office visit: 0  Interim History: Interval history: Mrs. Janice Hutchinson presents today for follow-up.  Since last office visit she has remained weight neutral. She had been traveling over the holidays and reports enjoying her time.  She acknowledges was not as mindful about her eating but still making healthy choices. She reports now following prescribed nutrition plan with good adherence.  She reports adequate satiety and satiation.  She denies abnormal cravings.  She has reduced consumption of sweets.  She finds eating 3 meals a day very difficult.  She has a low BMR as she previously skipped meals.    She is concerned about her blood pressure control as well as kidney function.  Subjective:   1. Hypertension, essential Reviewed home blood pressure monitoring her blood pressures are still elevated.  Her amlodipine was increased by PCP to twice a day for a total of 5 mg a day.  She is still on bisoprolol and hydrochlorothiazide.  Her spironolactone has been discontinued because of decline in GFR.  2. Decreased GFR I reviewed her most recent GFR from her doctor's office via patient phone.  Her GFR is 50, previous 16  She has been referred for a renal ultrasound.  She was inquiring about protein intake.  I explained to her that her protein intake is within guidelines for patients with chronic kidney disease.    Assessment/Plan:   1. Hypertension,  essential We reviewed goals of care.  She will continue to monitor her blood pressure and work with her PCP to intensify pharmacotherapy.  She may need to come off combination agent to determine if decrease in GFR is due to hydrochlorothiazide.  2. Decreased GFR My suspicion is that her decreased GFR may be related to hydrochlorothiazide I therefore feel that treatment interruption should be performed to rule out medication induced.  Her ARB can also do this.  I do not have access to all of her labs but patient will be looking at her GFR results over the last 2 to 3 years and correlate with initiation of these medications.  She will follow-up with ultrasound and her PCP patient advised on increasing fluid intake and avoiding nephrotoxins also blood pressure goal of less than 130/80.  3. Obesity, current BMI 32.9 Janice Hutchinson is currently in the action stage of change. As such, her goal is to continue with weight loss efforts. She has agreed to following a lower carbohydrate, vegetable and lean protein rich diet plan.   Exercise goals: As is.   Behavioral modification strategies: increasing lean protein intake, increasing water intake, no skipping meals, meal planning and cooking strategies, avoiding temptations, and planning for success.  Rudy has agreed to follow-up with our clinic in 3 weeks. She was informed of the importance of frequent follow-up visits to maximize her success with intensive lifestyle modifications for her multiple health conditions.   Objective:   Blood pressure (!) 146/84, pulse 60, temperature 98.1 F (36.7  C), height 5\' 6"  (1.676 m), weight 203 lb (92.1 kg), SpO2 99 %. Body mass index is 32.77 kg/m.  General: Cooperative, alert, well developed, in no acute distress. HEENT: Conjunctivae and lids unremarkable. Cardiovascular: Regular rhythm.  Lungs: Normal work of breathing. Neurologic: No focal deficits.   Lab Results  Component Value Date   CREATININE 1.23 (H)  05/29/2022   BUN 28 (H) 05/29/2022   NA 139 05/29/2022   K 5.2 05/29/2022   CL 103 05/29/2022   CO2 24 05/29/2022   Lab Results  Component Value Date   ALT 22 05/29/2022   AST 21 05/29/2022   ALKPHOS 93 05/29/2022   BILITOT 0.8 05/29/2022   Lab Results  Component Value Date   HGBA1C 4.7 (L) 05/29/2022   Lab Results  Component Value Date   INSULIN 12.1 05/01/2022   Lab Results  Component Value Date   TSH 0.58 05/04/2014   Lab Results  Component Value Date   CHOL 181 05/01/2022   HDL 52 05/01/2022   LDLCALC 112 (H) 05/01/2022   TRIG 91 05/01/2022   CHOLHDL 4 05/04/2014   Lab Results  Component Value Date   VD25OH 14.1 (L) 05/29/2022   VD25OH 28.71 (L) 05/04/2014   VD25OH 34 05/04/2013   Lab Results  Component Value Date   WBC 4.9 05/29/2022   HGB 15.3 05/29/2022   HCT 47.4 (H) 05/29/2022   MCV 80 05/29/2022   PLT 228 03/26/2017   No results found for: "IRON", "TIBC", "FERRITIN"  Attestation Statements:   Reviewed by clinician on day of visit: allergies, medications, problem list, medical history, surgical history, family history, social history, and previous encounter notes.   Wilhemena Durie, am acting as transcriptionist for Thomes Dinning, MD.  I have reviewed the above documentation for accuracy and completeness, and I agree with the above. -Thomes Dinning, MD

## 2022-08-01 DIAGNOSIS — I10 Essential (primary) hypertension: Secondary | ICD-10-CM | POA: Diagnosis not present

## 2022-08-01 DIAGNOSIS — N19 Unspecified kidney failure: Secondary | ICD-10-CM | POA: Diagnosis not present

## 2022-08-01 DIAGNOSIS — K8020 Calculus of gallbladder without cholecystitis without obstruction: Secondary | ICD-10-CM | POA: Diagnosis not present

## 2022-08-26 DIAGNOSIS — Z79899 Other long term (current) drug therapy: Secondary | ICD-10-CM | POA: Diagnosis not present

## 2022-08-26 DIAGNOSIS — I10 Essential (primary) hypertension: Secondary | ICD-10-CM | POA: Diagnosis not present

## 2022-08-26 DIAGNOSIS — I700 Atherosclerosis of aorta: Secondary | ICD-10-CM | POA: Diagnosis not present

## 2022-08-26 DIAGNOSIS — K8020 Calculus of gallbladder without cholecystitis without obstruction: Secondary | ICD-10-CM | POA: Diagnosis not present

## 2022-08-28 ENCOUNTER — Encounter (INDEPENDENT_AMBULATORY_CARE_PROVIDER_SITE_OTHER): Payer: Self-pay | Admitting: Internal Medicine

## 2022-08-28 ENCOUNTER — Ambulatory Visit (INDEPENDENT_AMBULATORY_CARE_PROVIDER_SITE_OTHER): Payer: Medicare Other | Admitting: Internal Medicine

## 2022-08-28 VITALS — BP 140/86 | HR 57 | Temp 98.0°F | Ht 66.0 in | Wt 200.0 lb

## 2022-08-28 DIAGNOSIS — E66812 Obesity, class 2: Secondary | ICD-10-CM

## 2022-08-28 DIAGNOSIS — R7303 Prediabetes: Secondary | ICD-10-CM

## 2022-08-28 DIAGNOSIS — I10 Essential (primary) hypertension: Secondary | ICD-10-CM | POA: Diagnosis not present

## 2022-08-28 DIAGNOSIS — E669 Obesity, unspecified: Secondary | ICD-10-CM

## 2022-08-28 DIAGNOSIS — Z6832 Body mass index (BMI) 32.0-32.9, adult: Secondary | ICD-10-CM

## 2022-08-28 DIAGNOSIS — R944 Abnormal results of kidney function studies: Secondary | ICD-10-CM

## 2022-08-28 DIAGNOSIS — Z6835 Body mass index (BMI) 35.0-35.9, adult: Secondary | ICD-10-CM

## 2022-08-28 NOTE — Assessment & Plan Note (Signed)
Blood pressure not at goal for age and risk category.  On bisoprolol, amlodipine and losartan without adverse effects.  All of her medications are maxed out clinically.  Most recent renal parameters reviewed which showed normal electrolytes and kidney function.  Continue with weight loss therapy.  Monitor for symptoms of orthostasis while losing weight. Continue current regimen and home monitoring for a goal blood pressure of 120/80.  I will connect with her PCP about medication adjustments.  She might need a fourth agent in the evening.  It seems that she has non-dipping.

## 2022-08-28 NOTE — Assessment & Plan Note (Addendum)
Reviewed recent labs on phone GFR now 66.  She is off hydrochlorothiazide and spironolactone.  She had renal US that did not show medical renal disease. BP is still above target at home and in office. She will continue to work with PCP on med intensification.  She is avoiding nephrotoxins and has increased water intake.  I will connect with her PCP about medication adjustments.

## 2022-08-28 NOTE — Assessment & Plan Note (Signed)
Most recent A1c is improved at Lab Results  Component Value Date   HGBA1C 4.7 (L) 05/29/2022   with associated elevated insulin levels.  Patient informed of disease state and risk of progression. This may contribute to abnormal cravings, fatigue and diabetes complications without having diabetes.   She will continue with weight loss therapy and increase physical activity.  We discussed the benefits of pharmacoprophylaxis with metformin.

## 2022-08-28 NOTE — Progress Notes (Signed)
Office: [phone]  /  Fax: [phone]  WEIGHT SUMMARY AND BIOMETRICS  Medical Weight Loss Height: 5\' 6"  (1.676 m) Weight: 200 lb (90.7 kg) Temp: 98 F (36.7 C) Pulse Rate: (!) 57 BP: (!) 140/86 SpO2: 99 % Fasting: no Labs: no Today's Visit #: 8 Weight at Last VIsit: 203 lb Weight Lost Since Last Visit: 3 lb  Body Fat %: 43.5 % Fat Mass (lbs): 87.2 lbs Muscle Mass (lbs): 107.6 lbs Total Body Water (lbs): 72.2 lbs Visceral Fat Rating : 13 Peak Weight: 217 lb Starting Date: 05/01/22 Starting Weight: 215 lb Total Weight Loss (lbs): 15 lb (6.804 kg)    HPI  Chief Complaint: OBESITY  Janice Hutchinson is here to discuss her progress with her obesity treatment plan. She is on the following a lower carbohydrate, vegetable and lean protein rich diet plan and states she is following her eating plan approximately 90 % of the time. She states she is walking  40 minutes 4 times per week and youtube    Interval History: Since last office she has lost 3 more pounds this brings her to a total of 15.  She feels well she is just concerned about her blood pressure control.  She is been working with her primary care making medication adjustments.  Her kidney function has now improved since she is off diuretics.  She still noticing elevations in blood pressure mostly in the mornings.  From a weight management perspective Denies problems with appetite and hunger signals. Denies problems with satiety and satiation. Denies problems with eating patterns and portion control. Sleeping approximately 7 hours a day. Sleep described as restorative. Stress levels are reported as low and manageable. Barriers identified none.   Pharmacotherapy: None  PHYSICAL EXAM:  Blood pressure (!) 140/86, pulse (!) 57, temperature 98 F (36.7 C), height 5\' 6"  (1.676 m), weight 200 lb (90.7 kg), SpO2 99 %. Body mass index is 32.28 kg/m.  General: She is overweight, cooperative, alert, well developed, and in no acute  distress. PSYCH: Has normal mood, affect and thought process.   HEENT: EOMI, sclerae are anicteric. Lungs: Normal breathing effort, no conversational dyspnea. Extremities: No edema.  Neurologic: No gross sensory or motor deficits. No tremors or fasciculations noted.    DIAGNOSTIC DATA REVIEWED:  BMET    Component Value Date/Time   NA 139 05/29/2022 1600   K 5.2 05/29/2022 1600   CL 103 05/29/2022 1600   CO2 24 05/29/2022 1600   GLUCOSE 90 05/29/2022 1600   GLUCOSE 92 03/17/2017 1048   BUN 28 (H) 05/29/2022 1600   CREATININE 1.23 (H) 05/29/2022 1600   CALCIUM 10.1 05/29/2022 1600   GFRNONAA >60 03/17/2017 1048   GFRAA >60 03/17/2017 1048   Lab Results  Component Value Date   HGBA1C 4.7 (L) 05/29/2022   Lab Results  Component Value Date   INSULIN 12.1 05/01/2022   Lab Results  Component Value Date   TSH 0.58 05/04/2014   CBC    Component Value Date/Time   WBC 4.9 05/29/2022 1600   WBC 10.8 (H) 03/26/2017 0838   RBC 5.91 (H) 05/29/2022 1600   RBC 5.52 (H) 03/26/2017 0838   HGB 15.3 05/29/2022 1600   HCT 47.4 (H) 05/29/2022 1600   PLT 228 03/26/2017 0838   MCV 80 05/29/2022 1600   MCH 25.9 (L) 05/29/2022 1600   MCH 25.5 (L) 03/26/2017 0838   MCHC 32.3 05/29/2022 1600   MCHC 33.6 03/26/2017 0838   RDW 14.3 05/29/2022 1600  Iron Studies No results found for: "IRON", "TIBC", "FERRITIN", "IRONPCTSAT" Lipid Panel     Component Value Date/Time   CHOL 181 05/01/2022 1222   TRIG 91 05/01/2022 1222   HDL 52 05/01/2022 1222   CHOLHDL 4 05/04/2014 1005   VLDL 15.0 05/04/2014 1005   LDLCALC 112 (H) 05/01/2022 1222   Hepatic Function Panel     Component Value Date/Time   PROT 7.4 05/29/2022 1600   ALBUMIN 4.2 05/29/2022 1600   AST 21 05/29/2022 1600   ALT 22 05/29/2022 1600   ALKPHOS 93 05/29/2022 1600   BILITOT 0.8 05/29/2022 1600   BILIDIR 0.2 05/04/2014 1005      Component Value Date/Time   TSH 0.58 05/04/2014 1005   Nutritional Lab Results   Component Value Date   VD25OH 14.1 (L) 05/29/2022   VD25OH 28.71 (L) 05/04/2014   VD25OH 34 05/04/2013     ASSESSMENT AND PLAN  TREATMENT PLAN FOR OBESITY:  Recommended Dietary Goals  Janice Hutchinson is currently in the action stage of change. As such, her goal is to continue weight management plan. She has agreed to following a lower carbohydrate, vegetable and lean protein rich diet plan.  Behavioral Intervention  We discussed the following Behavioral Modification Strategies today: increase water intake and reading food labels and making healthy choices when eating convenient foods.  Additional resources provided today: NA  Recommended Physical Activity Goals  Janice Hutchinson has been advised to work up to 150 minutes of moderate intensity aerobic activity a week and strengthening exercises 2-3 times per week for cardiovascular health, weight loss maintenance and preservation of muscle mass.   She has agreed to continue physical activity as is and incorporate strengthening once to twice a week.   Pharmacotherapy We discussed various medication options to help Janice Hutchinson with her weight loss efforts and we both agreed to continue with nutrition and behavioral strategies.  We talked about the benefits of metformin and off label use and weight management.  ASSOCIATED CONDITIONS ADDRESSED TODAY  Hypertension, essential Assessment & Plan: Blood pressure not at goal for age and risk category.  On bisoprolol, amlodipine and losartan without adverse effects.  All of her medications are maxed out clinically.  Most recent renal parameters reviewed which showed normal electrolytes and kidney function.  Continue with weight loss therapy.  Monitor for symptoms of orthostasis while losing weight. Continue current regimen and home monitoring for a goal blood pressure of 120/80.  I will connect with her PCP about medication adjustments.  She might need a fourth agent in the evening.  It seems that she has  non-dipping.     Decreased GFR Assessment & Plan: Reviewed recent labs on phone GFR now 66.  She is off hydrochlorothiazide and spironolactone.  She had renal US that did not show medical renal disease. BP is still above target at home and in office. She will continue to work with PCP on med intensification.  She is avoiding nephrotoxins and has increased water intake.  I will connect with her PCP about medication adjustments.   Obesity, current BMI 32.9  Prediabetes Assessment & Plan: Most recent A1c is improved at Lab Results  Component Value Date   HGBA1C 4.7 (L) 05/29/2022   with associated elevated insulin levels.  Patient informed of disease state and risk of progression. This may contribute to abnormal cravings, fatigue and diabetes complications without having diabetes.   She will continue with weight loss therapy and increase physical activity.  We discussed the benefits of pharmacoprophylaxis  with metformin.        Return in about 3 weeks (around 09/18/2022) for For Weight Mangement with Dr. Gerarda Fraction.Marland Kitchen She was informed of the importance of frequent follow up visits to maximize her success with intensive lifestyle modifications for her multiple health conditions.   ATTESTASTION STATEMENTS:  Reviewed by clinician on day of visit: allergies, medications, problem list, medical history, surgical history, family history, social history, and previous encounter notes.   Time spent on visit including pre-visit chart review and post-visit care and charting was 30 minutes.    Thomes Dinning, MD

## 2022-09-05 DIAGNOSIS — R140 Abdominal distension (gaseous): Secondary | ICD-10-CM | POA: Diagnosis not present

## 2022-09-05 DIAGNOSIS — K8020 Calculus of gallbladder without cholecystitis without obstruction: Secondary | ICD-10-CM | POA: Diagnosis not present

## 2022-09-18 ENCOUNTER — Encounter (INDEPENDENT_AMBULATORY_CARE_PROVIDER_SITE_OTHER): Payer: Self-pay | Admitting: Internal Medicine

## 2022-09-18 ENCOUNTER — Ambulatory Visit (INDEPENDENT_AMBULATORY_CARE_PROVIDER_SITE_OTHER): Payer: Medicare Other | Admitting: Internal Medicine

## 2022-09-18 VITALS — BP 135/78 | HR 59 | Temp 98.2°F | Ht 66.0 in | Wt 199.0 lb

## 2022-09-18 DIAGNOSIS — E669 Obesity, unspecified: Secondary | ICD-10-CM | POA: Diagnosis not present

## 2022-09-18 DIAGNOSIS — R7303 Prediabetes: Secondary | ICD-10-CM | POA: Diagnosis not present

## 2022-09-18 DIAGNOSIS — I10 Essential (primary) hypertension: Secondary | ICD-10-CM | POA: Diagnosis not present

## 2022-09-18 DIAGNOSIS — R1013 Epigastric pain: Secondary | ICD-10-CM | POA: Insufficient documentation

## 2022-09-18 DIAGNOSIS — Z6832 Body mass index (BMI) 32.0-32.9, adult: Secondary | ICD-10-CM

## 2022-09-18 NOTE — Assessment & Plan Note (Signed)
Blood pressure has improved.  I reviewed home blood pressure monitoring.  On bisoprolol, amlodipine and losartan and now hydrochlorothiazide without adverse effects.  Although she has noted increased leg cramps since starting diuretic.  Continue with weight loss therapy.  Monitor for symptoms of orthostasis while losing weight. Continue current regimen and home monitoring for a goal blood pressure of 120/80.  Advised to increase water intake.

## 2022-09-18 NOTE — Assessment & Plan Note (Signed)
This has been evaluated by her primary care doctor.  She is scheduled to see a surgeon as she was found to have gallstones on an ultrasound.  Her abdominal pain does not appear to be biliary in origin.  Considering her age and the fact that she is reporting early satiety and regurgitation she may benefit from an upper endoscopy.  She will review this with her primary care and/or surgeon.  I also recommend that she stop turmeric to rule out supplement is causing upset stomach.  In addition she will maintain a diary to rule out certain vegetables as an etiology to her discomfort.  This is associated with abdominal bloating as well.  She may have an intolerance to some vegetable sugars.

## 2022-09-18 NOTE — Assessment & Plan Note (Signed)
Most recent A1c is improved at Lab Results  Component Value Date   HGBA1C 4.7 (L) 05/29/2022    She will continue with weight loss therapy and increase physical activity.  We discussed the benefits of pharmacoprophylaxis with metformin which she has declined.

## 2022-09-18 NOTE — Progress Notes (Signed)
Office: [phone]  /  Fax: [phone]  WEIGHT SUMMARY AND BIOMETRICS  Medical Weight Loss Height: 5' 6"$  (1.676 m) Weight: 199 lb (90.3 kg) Temp: 98.2 F (36.8 C) Pulse Rate: (!) 59 BP: 135/78 SpO2: 99 % Fasting: No Labs: No Today's Visit #: 9 Weight at Last Visit: 200 lb Weight Lost Since Last Visit: 1 lb  Body Fat %: 42.7 % Fat Mass (lbs): 85.4 lbs Muscle Mass (lbs): 108.6 lbs Total Body Water (lbs): 71.8 lbs Visceral Fat Rating : 13 Peak Weight: 217 lb Starting Date: 05/01/22 Starting Weight: 215 lb Total Weight Loss (lbs): 16 lb (7.258 kg)    HPI  Chief Complaint: OBESITY  Janice Hutchinson is here to discuss her progress with her obesity treatment plan. She is on the following a lower carbohydrate, vegetable and lean protein rich diet plan and states she is following her eating plan approximately 90 % of the time. She states she is exercising 40 minutes 4 times per week.   Interval History:  Since last office visit she has lost 1 pound.  She has been having problems with stomach pains.  She has early satiety and a sense of regurgitation.  She denies any shortness of breath, palpitations or diaphoresis.  She has been evaluated by her primary care doctor and has been referred to a surgeon because of incidental findings of gallstones.  She denies any nausea or vomiting.  She has been tracking and journaling her calories.  She is been consistently staying under 1000 cal but has not been eating daily protein goal on several days.  She was recently started on hydrochlorothiazide by her PCP and blood pressure has improved.    Pharmacotherapy: None  PHYSICAL EXAM:  Blood pressure 135/78, pulse (!) 59, temperature 98.2 F (36.8 C), height 5' 6"$  (1.676 m), weight 199 lb (90.3 kg), SpO2 99 %. Body mass index is 32.12 kg/m.  General: She is overweight, cooperative, alert, well developed, and in no acute distress. PSYCH: Has normal mood, affect and thought process.   HEENT:  EOMI, sclerae are anicteric. Lungs: Normal breathing effort, no conversational dyspnea. Extremities: No edema.  Neurologic: No gross sensory or motor deficits. No tremors or fasciculations noted.    ASSESSMENT AND PLAN  TREATMENT PLAN FOR OBESITY:  Recommended Dietary Goals  Janice Hutchinson is currently in the action stage of change. As such, her goal is to continue weight management plan. She has agreed to following a lower carbohydrate, vegetable and lean protein rich diet plan.  Behavioral Intervention  We discussed the following Behavioral Modification Strategies today: increasing lean protein intake, increase water intake, reading food labels and making healthy choices when eating convenient foods, and work on tracking and journaling calories using tracking App.  Additional resources provided today: NA  Recommended Physical Activity Goals  Janice Hutchinson has been advised to work up to 150 minutes of moderate intensity aerobic activity a week and strengthening exercises 2-3 times per week for cardiovascular health, weight loss maintenance and preservation of muscle mass.   She has agreed to continue physical activity as is.    Pharmacotherapy We discussed various medication options to help Janice Hutchinson with her weight loss efforts and we both agreed to continue with nutritional and behavioral strategies.  ASSOCIATED CONDITIONS ADDRESSED TODAY  Hypertension, essential Assessment & Plan: Blood pressure has improved.  I reviewed home blood pressure monitoring.  On bisoprolol, amlodipine and losartan and now hydrochlorothiazide without adverse effects.  Although she has noted increased leg cramps since starting diuretic.  Continue with weight loss therapy.  Monitor for symptoms of orthostasis while losing weight. Continue current regimen and home monitoring for a goal blood pressure of 120/80.  Advised to increase water intake.    Prediabetes Assessment & Plan: Most recent A1c is improved at Lab  Results  Component Value Date   HGBA1C 4.7 (L) 05/29/2022    She will continue with weight loss therapy and increase physical activity.  We discussed the benefits of pharmacoprophylaxis with metformin which she has declined.    Obesity, current BMI 32.9  Epigastric pain Assessment & Plan: This has been evaluated by her primary care doctor.  She is scheduled to see a surgeon as she was found to have gallstones on an ultrasound.  Her abdominal pain does not appear to be biliary in origin.  Considering her age and the fact that she is reporting early satiety and regurgitation she may benefit from an upper endoscopy.  She will review this with her primary care and/or surgeon.  I also recommend that she stop turmeric to rule out supplement is causing upset stomach.  In addition she will maintain a diary to rule out certain vegetables as an etiology to her discomfort.  This is associated with abdominal bloating as well.  She may have an intolerance to some vegetable sugars.       DIAGNOSTIC DATA REVIEWED:  BMET    Component Value Date/Time   NA 139 05/29/2022 1600   K 5.2 05/29/2022 1600   CL 103 05/29/2022 1600   CO2 24 05/29/2022 1600   GLUCOSE 90 05/29/2022 1600   GLUCOSE 92 03/17/2017 1048   BUN 28 (H) 05/29/2022 1600   CREATININE 1.23 (H) 05/29/2022 1600   CALCIUM 10.1 05/29/2022 1600   GFRNONAA >60 03/17/2017 1048   GFRAA >60 03/17/2017 1048   Lab Results  Component Value Date   HGBA1C 4.7 (L) 05/29/2022   Lab Results  Component Value Date   INSULIN 12.1 05/01/2022   Lab Results  Component Value Date   TSH 0.58 05/04/2014   CBC    Component Value Date/Time   WBC 4.9 05/29/2022 1600   WBC 10.8 (H) 03/26/2017 0838   RBC 5.91 (H) 05/29/2022 1600   RBC 5.52 (H) 03/26/2017 0838   HGB 15.3 05/29/2022 1600   HCT 47.4 (H) 05/29/2022 1600   PLT 228 03/26/2017 0838   MCV 80 05/29/2022 1600   MCH 25.9 (L) 05/29/2022 1600   MCH 25.5 (L) 03/26/2017 0838   MCHC 32.3  05/29/2022 1600   MCHC 33.6 03/26/2017 0838   RDW 14.3 05/29/2022 1600   Iron Studies No results found for: "IRON", "TIBC", "FERRITIN", "IRONPCTSAT" Lipid Panel     Component Value Date/Time   CHOL 181 05/01/2022 1222   TRIG 91 05/01/2022 1222   HDL 52 05/01/2022 1222   CHOLHDL 4 05/04/2014 1005   VLDL 15.0 05/04/2014 1005   LDLCALC 112 (H) 05/01/2022 1222   Hepatic Function Panel     Component Value Date/Time   PROT 7.4 05/29/2022 1600   ALBUMIN 4.2 05/29/2022 1600   AST 21 05/29/2022 1600   ALT 22 05/29/2022 1600   ALKPHOS 93 05/29/2022 1600   BILITOT 0.8 05/29/2022 1600   BILIDIR 0.2 05/04/2014 1005      Component Value Date/Time   TSH 0.58 05/04/2014 1005   Nutritional Lab Results  Component Value Date   VD25OH 14.1 (L) 05/29/2022   VD25OH 28.71 (L) 05/04/2014   VD25OH 34 05/04/2013  Return in about 3 weeks (around 10/09/2022) for For Weight Mangement with Dr. Gerarda Fraction.Marland Kitchen She was informed of the importance of frequent follow up visits to maximize her success with intensive lifestyle modifications for her multiple health conditions.    ATTESTASTION STATEMENTS:  Reviewed by clinician on day of visit: allergies, medications, problem list, medical history, surgical history, family history, social history, and previous encounter notes.   Time spent on visit including pre-visit chart review and post-visit care and charting was 30 minutes.    Thomes Dinning, MD

## 2022-09-19 NOTE — Addendum Note (Signed)
Addended by: Glendale Chard on: 09/19/2022 09:17 AM   Modules accepted: Level of Service

## 2022-09-25 DIAGNOSIS — K8020 Calculus of gallbladder without cholecystitis without obstruction: Secondary | ICD-10-CM | POA: Diagnosis not present

## 2022-09-29 ENCOUNTER — Ambulatory Visit: Payer: Self-pay | Admitting: General Surgery

## 2022-09-29 DIAGNOSIS — K8020 Calculus of gallbladder without cholecystitis without obstruction: Secondary | ICD-10-CM

## 2022-09-29 MED ORDER — SPY AGENT GREEN - (INDOCYANINE FOR INJECTION): 1.2500 mg | Freq: Once | INTRAMUSCULAR | Status: AC

## 2022-09-29 NOTE — Progress Notes (Signed)
Sent message, via epic in basket, requesting orders in epic from surgeon.  

## 2022-09-29 NOTE — Patient Instructions (Signed)
SURGICAL WAITING ROOM VISITATION Patients having surgery or a procedure may have no more than 2 support people in the waiting area - these visitors may rotate.    If the patient needs to stay at the hospital during part of their recovery, the visitor guidelines for inpatient rooms apply. Pre-op nurse will coordinate an appropriate time for 1 support person to accompany patient in pre-op.  This support person may not rotate.    Please refer to the [facility] website for the visitor guidelines for Inpatients (after your surgery is over and you are in a regular room).   Due to an increase in RSV and influenza rates and associated hospitalizations, children ages 72 and under may not visit patients in [place].     Your procedure is scheduled on: 10-07-22   Report to [facility] Main Entrance    Report to admitting at 10:45 AM   Call this number if you have problems the morning of surgery [phone]   Do not eat food :After Midnight.   After Midnight you may have the following liquids until 10:00 AM DAY OF SURGERY  Water Non-Citrus Juices (without pulp, NO RED) Carbonated Beverages Black Coffee (NO MILK/CREAM OR CREAMERS, sugar ok)  Clear Tea (NO MILK/CREAM OR CREAMERS, sugar ok) regular and decaf                             Plain Jell-O (NO RED)                                           Fruit ices (not with fruit pulp, NO RED)                                     Popsicles (NO RED)                                                               Sports drinks like Gatorade (NO RED)                   If you have questions, please contact your surgeon's office.   FOLLOW  ANY ADDITIONAL PRE OP INSTRUCTIONS YOU RECEIVED FROM YOUR SURGEON'S OFFICE!!!     Oral Hygiene is also important to reduce your risk of infection.                                    Remember - BRUSH YOUR TEETH THE MORNING OF SURGERY WITH YOUR REGULAR TOOTHPASTE   Do NOT smoke after  Midnight   Take these medicines the morning of surgery with A SIP OF WATER:   Amlodipine  Bisoprolol  Tylenol if needed                              You may not have any metal on your body including hair pins, jewelry, and body piercing  Do not wear make-up, lotions, powders, perfumes or deodorant  Do not wear nail polish including gel and S&S, artificial/acrylic nails, or any other type of covering on natural nails including finger and toenails. If you have artificial nails, gel coating, etc. that needs to be removed by a nail salon please have this removed prior to surgery or surgery may need to be canceled/ delayed if the surgeon/ anesthesia feels like they are unable to be safely monitored.   Do not shave  48 hours prior to surgery.              Do not bring valuables to the hospital. [place].   Contacts, dentures or bridgework may not be worn into surgery.  DO NOT [place]. PHARMACY WILL DISPENSE MEDICATIONS LISTED ON YOUR MEDICATION LIST TO YOU DURING YOUR ADMISSION [place]!    Patients discharged on the day of surgery will not be allowed to drive home.  Someone NEEDS to stay with you for the first 24 hours after anesthesia.   Special Instructions: Bring a copy of your healthcare power of attorney and living will documents the day of surgery if you haven't scanned them before.              Please read over the following fact sheets you were given: IF [place]  If you received a COVID test during your pre-op visit  it is requested that you wear a mask when out in public, stay away from anyone that may not be feeling well and notify your surgeon if you develop symptoms. If you test positive for Covid or have been in contact with anyone that has tested positive in the last 10 days please notify you surgeon.  [place] -  Preparing for Surgery Before surgery, you can play an important role.  Because skin is not sterile, your skin needs to be as free of germs as possible.  You can reduce the number of germs on your skin by washing with CHG (chlorahexidine gluconate) soap before surgery.  CHG is an antiseptic cleaner which kills germs and bonds with the skin to continue killing germs even after washing. Please DO NOT use if you have an allergy to CHG or antibacterial soaps.  If your skin becomes reddened/irritated stop using the CHG and inform your nurse when you arrive at Short Stay. Do not shave (including legs and underarms) for at least 48 hours prior to the first CHG shower.  You may shave your face/neck.  Please follow these instructions carefully:  1.  Shower with CHG Soap the night before surgery and the  morning of surgery.  2.  If you choose to wash your hair, wash your hair first as usual with your normal  shampoo.  3.  After you shampoo, rinse your hair and body thoroughly to remove the shampoo.                             4.  Use CHG as you would any other liquid soap.  You can apply chg directly to the skin and wash.  Gently with a scrungie or clean washcloth.  5.  Apply the CHG Soap to your body ONLY FROM THE NECK DOWN.   Do   not use on face/ open  Wound or open sores. Avoid contact with eyes, ears mouth and   genitals (private parts).                       Wash face,  Genitals (private parts) with your normal soap.             6.  Wash thoroughly, paying special attention to the area where your    surgery  will be performed.  7.  Thoroughly rinse your body with warm water from the neck down.  8.  DO NOT shower/wash with your normal soap after using and rinsing off the CHG Soap.                9.  Pat yourself dry with a clean towel.            10.  Wear clean pajamas.            11.  Place clean sheets on your bed the night of your first shower and do not  sleep with  pets. Day of Surgery : Do not apply any lotions/deodorants the morning of surgery.  Please wear clean clothes to the hospital/surgery center.  FAILURE TO FOLLOW THESE INSTRUCTIONS MAY RESULT IN THE CANCELLATION OF YOUR SURGERY  PATIENT SIGNATURE_________________________________  NURSE SIGNATURE__________________________________  ________________________________________________________________________

## 2022-09-29 NOTE — Progress Notes (Signed)
COVID Vaccine Completed:  Date of COVID positive in last 90 days:  PCP - Ernestene Kiel, MD Cardiologist -   Chest x-ray -  EKG - 05-01-22 Epic Stress Test -  ECHO - 12-24-21 Epic Cardiac Cath -  Pacemaker/ICD device last checked: Spinal Cord Stimulator:  Bowel Prep -   Sleep Study -  CPAP -   Prediabetes Fasting Blood Sugar -  Checks Blood Sugar _____ times a day  Last dose of GLP1 agonist-  N/A GLP1 instructions:  N/A   Last dose of SGLT-2 inhibitors-  N/A SGLT-2 instructions: N/A   Blood Thinner Instructions: Aspirin Instructions: Last Dose:  Activity level:  Can go up a flight of stairs and perform activities of daily living without stopping and without symptoms of chest pain or shortness of breath.  Able to exercise without symptoms  Unable to go up a flight of stairs without symptoms of     Anesthesia review:  MVP, mild aortic regurg, mild mitral regurg, HTN  Patient denies shortness of breath, fever, cough and chest pain at PAT appointment  Patient verbalized understanding of instructions that were given to them at the PAT appointment. Patient was also instructed that they will need to review over the PAT instructions again at home before surgery.

## 2022-10-01 ENCOUNTER — Encounter (HOSPITAL_COMMUNITY)
Admission: RE | Admit: 2022-10-01 | Discharge: 2022-10-01 | Disposition: A | Discharge status: Home / Self Care | Payer: Medicare Other | Source: Ambulatory Visit | Attending: General Surgery | Admitting: General Surgery

## 2022-10-01 ENCOUNTER — Encounter (HOSPITAL_COMMUNITY): Payer: Self-pay

## 2022-10-01 ENCOUNTER — Other Ambulatory Visit: Payer: Self-pay

## 2022-10-01 VITALS — BP 139/73 | HR 57 | Temp 98.7°F | Resp 16 | Ht 66.5 in | Wt 204.2 lb

## 2022-10-01 DIAGNOSIS — Z01812 Encounter for preprocedural laboratory examination: Secondary | ICD-10-CM | POA: Insufficient documentation

## 2022-10-01 DIAGNOSIS — K8020 Calculus of gallbladder without cholecystitis without obstruction: Secondary | ICD-10-CM | POA: Diagnosis not present

## 2022-10-01 DIAGNOSIS — I10 Essential (primary) hypertension: Secondary | ICD-10-CM | POA: Diagnosis not present

## 2022-10-01 DIAGNOSIS — Z01818 Encounter for other preprocedural examination: Secondary | ICD-10-CM

## 2022-10-01 HISTORY — DX: Anemia, unspecified: D64.9

## 2022-10-01 HISTORY — DX: Headache, unspecified: R51.9

## 2022-10-01 HISTORY — DX: Gastro-esophageal reflux disease without esophagitis: K21.9

## 2022-10-01 LAB — HEPATIC FUNCTION PANEL
ALT: 28 U/L (ref 0–44)
AST: 23 U/L (ref 15–41)
Albumin: 3.7 g/dL (ref 3.5–5.0)
Alkaline Phosphatase: 81 U/L (ref 38–126)
Bilirubin, Direct: 0.3 mg/dL — ABNORMAL HIGH (ref 0.0–0.2)
Indirect Bilirubin: 0.9 mg/dL (ref 0.3–0.9)
Total Bilirubin: 1.2 mg/dL (ref 0.3–1.2)
Total Protein: 7.3 g/dL (ref 6.5–8.1)

## 2022-10-01 LAB — BASIC METABOLIC PANEL
Anion gap: 3 — ABNORMAL LOW (ref 5–15)
BUN: 33 mg/dL — ABNORMAL HIGH (ref 8–23)
CO2: 26 mmol/L (ref 22–32)
Calcium: 9.3 mg/dL (ref 8.9–10.3)
Chloride: 109 mmol/L (ref 98–111)
Creatinine, Ser: 1.05 mg/dL — ABNORMAL HIGH (ref 0.44–1.00)
GFR, Estimated: 57 mL/min — ABNORMAL LOW (ref >60)
Glucose, Bld: 84 mg/dL (ref 70–99)
Potassium: 4 mmol/L (ref 3.5–5.1)
Sodium: 138 mmol/L (ref 135–145)

## 2022-10-01 LAB — CBC
HCT: 46.3 % — ABNORMAL HIGH (ref 36.0–46.0)
Hemoglobin: 14.4 g/dL (ref 12.0–15.0)
MCH: 25.9 pg — ABNORMAL LOW (ref 26.0–34.0)
MCHC: 31.1 g/dL (ref 30.0–36.0)
MCV: 83.4 fL (ref 80.0–100.0)
Platelets: 204 10*3/uL (ref 150–400)
RBC: 5.55 MIL/uL — ABNORMAL HIGH (ref 3.87–5.11)
RDW: 14.8 % (ref 11.5–15.5)
WBC: 4.8 10*3/uL (ref 4.0–10.5)
nRBC: 0 % (ref 0.0–0.2)

## 2022-10-07 ENCOUNTER — Other Ambulatory Visit: Payer: Self-pay

## 2022-10-07 ENCOUNTER — Encounter (HOSPITAL_COMMUNITY): Payer: Self-pay | Admitting: General Surgery

## 2022-10-07 ENCOUNTER — Ambulatory Visit (HOSPITAL_COMMUNITY)
Admission: RE | Admit: 2022-10-07 | Discharge: 2022-10-07 | Disposition: A | Discharge status: Home / Self Care | Payer: Medicare Other | Attending: General Surgery | Admitting: General Surgery

## 2022-10-07 ENCOUNTER — Ambulatory Visit (HOSPITAL_BASED_OUTPATIENT_CLINIC_OR_DEPARTMENT_OTHER): Payer: Medicare Other | Admitting: Certified Registered"

## 2022-10-07 ENCOUNTER — Ambulatory Visit (HOSPITAL_COMMUNITY): Payer: Medicare Other | Admitting: Physician Assistant

## 2022-10-07 ENCOUNTER — Encounter (HOSPITAL_COMMUNITY)
Admission: RE | Disposition: A | Discharge status: Home / Self Care | Payer: Self-pay | Source: Home / Self Care | Attending: General Surgery

## 2022-10-07 DIAGNOSIS — K8020 Calculus of gallbladder without cholecystitis without obstruction: Secondary | ICD-10-CM | POA: Diagnosis not present

## 2022-10-07 DIAGNOSIS — K219 Gastro-esophageal reflux disease without esophagitis: Secondary | ICD-10-CM | POA: Insufficient documentation

## 2022-10-07 DIAGNOSIS — D649 Anemia, unspecified: Secondary | ICD-10-CM

## 2022-10-07 DIAGNOSIS — M1990 Unspecified osteoarthritis, unspecified site: Secondary | ICD-10-CM

## 2022-10-07 DIAGNOSIS — Z6832 Body mass index (BMI) 32.0-32.9, adult: Secondary | ICD-10-CM | POA: Diagnosis not present

## 2022-10-07 DIAGNOSIS — K828 Other specified diseases of gallbladder: Secondary | ICD-10-CM | POA: Diagnosis not present

## 2022-10-07 DIAGNOSIS — F419 Anxiety disorder, unspecified: Secondary | ICD-10-CM | POA: Insufficient documentation

## 2022-10-07 DIAGNOSIS — I10 Essential (primary) hypertension: Secondary | ICD-10-CM

## 2022-10-07 DIAGNOSIS — Z79899 Other long term (current) drug therapy: Secondary | ICD-10-CM | POA: Insufficient documentation

## 2022-10-07 DIAGNOSIS — M797 Fibromyalgia: Secondary | ICD-10-CM | POA: Diagnosis not present

## 2022-10-07 DIAGNOSIS — E669 Obesity, unspecified: Secondary | ICD-10-CM | POA: Insufficient documentation

## 2022-10-07 DIAGNOSIS — K859 Acute pancreatitis without necrosis or infection, unspecified: Secondary | ICD-10-CM | POA: Diagnosis not present

## 2022-10-07 HISTORY — PX: CHOLECYSTECTOMY: SHX55

## 2022-10-07 SURGERY — LAPAROSCOPIC CHOLECYSTECTOMY
Anesthesia: General

## 2022-10-07 MED ORDER — ACETAMINOPHEN 500 MG PO TABS: 1000.0000 mg | ORAL_TABLET | Freq: Three times a day (TID) | ORAL | 0 refills | Status: AC

## 2022-10-07 MED ORDER — FENTANYL CITRATE (PF) 100 MCG/2ML IJ SOLN
INTRAMUSCULAR | Status: AC
  Filled 2022-10-07: qty 2

## 2022-10-07 MED ORDER — PHENYLEPHRINE 80 MCG/ML (10ML) SYRINGE FOR IV PUSH (FOR BLOOD PRESSURE SUPPORT)
PREFILLED_SYRINGE | INTRAVENOUS | Status: DC | PRN
  Administered 2022-10-07: 40 ug via INTRAVENOUS

## 2022-10-07 MED ORDER — PROPOFOL 10 MG/ML IV BOLUS
INTRAVENOUS | Status: AC
  Filled 2022-10-07: qty 20

## 2022-10-07 MED ORDER — CHLORHEXIDINE GLUCONATE CLOTH 2 % EX PADS: 6.0000 | MEDICATED_PAD | Freq: Once | CUTANEOUS | Status: DC

## 2022-10-07 MED ORDER — BUPIVACAINE-EPINEPHRINE (PF) 0.25% -1:200000 IJ SOLN
INTRAMUSCULAR | Status: AC
  Filled 2022-10-07: qty 30

## 2022-10-07 MED ORDER — ACETAMINOPHEN 500 MG PO TABS
1000.0000 mg | ORAL_TABLET | ORAL | Status: DC
  Filled 2022-10-07: qty 2

## 2022-10-07 MED ORDER — PROPOFOL 10 MG/ML IV BOLUS
INTRAVENOUS | Status: DC | PRN
  Administered 2022-10-07: 140 mg via INTRAVENOUS

## 2022-10-07 MED ORDER — EPHEDRINE SULFATE-NACL 50-0.9 MG/10ML-% IV SOSY
PREFILLED_SYRINGE | INTRAVENOUS | Status: DC | PRN
  Administered 2022-10-07 (×4): 5 mg via INTRAVENOUS
  Administered 2022-10-07: 10 mg via INTRAVENOUS
  Administered 2022-10-07: 5 mg via INTRAVENOUS

## 2022-10-07 MED ORDER — LACTATED RINGERS IV SOLN
INTRAVENOUS | Status: DC | PRN
  Administered 2022-10-07: 1000 mL

## 2022-10-07 MED ORDER — BUPIVACAINE-EPINEPHRINE 0.25% -1:200000 IJ SOLN
INTRAMUSCULAR | Status: DC | PRN
  Administered 2022-10-07: 30 mL

## 2022-10-07 MED ORDER — ORAL CARE MOUTH RINSE: 15.0000 mL | Freq: Once | OROMUCOSAL | Status: AC

## 2022-10-07 MED ORDER — AMISULPRIDE (ANTIEMETIC) 5 MG/2ML IV SOLN
10.0000 mg | Freq: Once | INTRAVENOUS | Status: AC | PRN
  Administered 2022-10-07: 10 mg via INTRAVENOUS

## 2022-10-07 MED ORDER — ROCURONIUM BROMIDE 10 MG/ML (PF) SYRINGE
PREFILLED_SYRINGE | INTRAVENOUS | Status: DC | PRN
  Administered 2022-10-07: 70 mg via INTRAVENOUS

## 2022-10-07 MED ORDER — ROCURONIUM BROMIDE 10 MG/ML (PF) SYRINGE
PREFILLED_SYRINGE | INTRAVENOUS | Status: AC
  Filled 2022-10-07: qty 10

## 2022-10-07 MED ORDER — OXYCODONE HCL 5 MG PO TABS: 5.0000 mg | ORAL_TABLET | Freq: Four times a day (QID) | ORAL | 0 refills | Status: DC | PRN

## 2022-10-07 MED ORDER — SUGAMMADEX SODIUM 200 MG/2ML IV SOLN
INTRAVENOUS | Status: DC | PRN
  Administered 2022-10-07: 200 mg via INTRAVENOUS

## 2022-10-07 MED ORDER — ONDANSETRON HCL 4 MG/2ML IJ SOLN
INTRAMUSCULAR | Status: DC | PRN
  Administered 2022-10-07: 4 mg via INTRAVENOUS

## 2022-10-07 MED ORDER — CHLORHEXIDINE GLUCONATE 0.12 % MT SOLN
15.0000 mL | Freq: Once | OROMUCOSAL | Status: AC
  Administered 2022-10-07: 15 mL via OROMUCOSAL

## 2022-10-07 MED ORDER — DEXAMETHASONE SODIUM PHOSPHATE 10 MG/ML IJ SOLN
INTRAMUSCULAR | Status: DC | PRN
  Administered 2022-10-07: 4 mg via INTRAVENOUS

## 2022-10-07 MED ORDER — AMISULPRIDE (ANTIEMETIC) 5 MG/2ML IV SOLN
INTRAVENOUS | Status: AC
  Filled 2022-10-07: qty 4

## 2022-10-07 MED ORDER — PHENYLEPHRINE 80 MCG/ML (10ML) SYRINGE FOR IV PUSH (FOR BLOOD PRESSURE SUPPORT)
PREFILLED_SYRINGE | INTRAVENOUS | Status: AC
  Filled 2022-10-07: qty 20

## 2022-10-07 MED ORDER — ONDANSETRON HCL 4 MG/2ML IJ SOLN
INTRAMUSCULAR | Status: AC
  Filled 2022-10-07: qty 2

## 2022-10-07 MED ORDER — FENTANYL CITRATE (PF) 100 MCG/2ML IJ SOLN
INTRAMUSCULAR | Status: DC | PRN
  Administered 2022-10-07: 50 ug via INTRAVENOUS

## 2022-10-07 MED ORDER — EPHEDRINE 5 MG/ML INJ
INTRAVENOUS | Status: AC
  Filled 2022-10-07: qty 15

## 2022-10-07 MED ORDER — DEXAMETHASONE SODIUM PHOSPHATE 10 MG/ML IJ SOLN
INTRAMUSCULAR | Status: AC
  Filled 2022-10-07: qty 1

## 2022-10-07 MED ORDER — LIDOCAINE 2% (20 MG/ML) 5 ML SYRINGE
INTRAMUSCULAR | Status: DC | PRN
  Administered 2022-10-07: 1 mg/kg/h via INTRAVENOUS

## 2022-10-07 MED ORDER — LIDOCAINE HCL (PF) 2 % IJ SOLN
INTRAMUSCULAR | Status: AC
  Filled 2022-10-07: qty 10

## 2022-10-07 MED ORDER — SODIUM CHLORIDE 0.9 % IV SOLN
2.0000 g | INTRAVENOUS | Status: AC
  Administered 2022-10-07: 2 g via INTRAVENOUS
  Filled 2022-10-07: qty 2

## 2022-10-07 MED ORDER — LACTATED RINGERS IV SOLN: INTRAVENOUS | Status: DC

## 2022-10-07 MED ORDER — HYDROMORPHONE HCL 1 MG/ML IJ SOLN: 0.2500 mg | INTRAMUSCULAR | Status: DC | PRN

## 2022-10-07 MED ORDER — LIDOCAINE 20MG/ML (2%) 15 ML SYRINGE OPTIME
INTRAMUSCULAR | Status: DC | PRN
  Administered 2022-10-07: 60 mg via INTRAVENOUS

## 2022-10-07 MED ORDER — INDOCYANINE GREEN 25 MG IV SOLR
2.5000 mg | Freq: Once | INTRAVENOUS | Status: AC
  Administered 2022-10-07: 2.5 mg via INTRAVENOUS

## 2022-10-07 SURGICAL SUPPLY — 55 items
ADH SKN CLS APL DERMABOND .7 (GAUZE/BANDAGES/DRESSINGS) ×1
APL PRP STRL LF DISP 70% ISPRP (MISCELLANEOUS) ×1
APL SRG 38 LTWT LNG FL B (MISCELLANEOUS)
APPLICATOR ARISTA FLEXITIP XL (MISCELLANEOUS) IMPLANT
APPLIER CLIP 5 13 M/L LIGAMAX5 (MISCELLANEOUS)
APPLIER CLIP ROT 10 11.4 M/L (STAPLE)
APR CLP MED LRG 11.4X10 (STAPLE)
APR CLP MED LRG 5 ANG JAW (MISCELLANEOUS)
BAG COUNTER SPONGE SURGICOUNT (BAG) IMPLANT
BAG SPEC RTRVL 10 TROC 200 (ENDOMECHANICALS) ×1
BAG SPNG CNTER NS LX DISP (BAG)
CABLE HIGH FREQUENCY MONO STRZ (ELECTRODE) ×1 IMPLANT
CHLORAPREP W/TINT 26 (MISCELLANEOUS) ×1 IMPLANT
CLIP APPLIE 5 13 M/L LIGAMAX5 (MISCELLANEOUS) IMPLANT
CLIP APPLIE ROT 10 11.4 M/L (STAPLE) IMPLANT
CLIP LIGATING HEMO O LOK GREEN (MISCELLANEOUS) IMPLANT
COVER MAYO STAND XLG (MISCELLANEOUS) IMPLANT
COVER SURGICAL LIGHT HANDLE (MISCELLANEOUS) ×1 IMPLANT
DERMABOND ADVANCED .7 DNX12 (GAUZE/BANDAGES/DRESSINGS) IMPLANT
DRAPE C-ARM 42X120 X-RAY (DRAPES) IMPLANT
DRSG TEGADERM 2-3/8X2-3/4 SM (GAUZE/BANDAGES/DRESSINGS) ×3 IMPLANT
DRSG TEGADERM 4X4.75 (GAUZE/BANDAGES/DRESSINGS) ×1 IMPLANT
ELECT REM PT RETURN 15FT ADLT (MISCELLANEOUS) ×1 IMPLANT
GAUZE SPONGE 2X2 STRL 8-PLY (GAUZE/BANDAGES/DRESSINGS) ×1 IMPLANT
GLOVE BIO SURGEON STRL SZ7.5 (GLOVE) ×1 IMPLANT
GLOVE INDICATOR 8.0 STRL GRN (GLOVE) ×1 IMPLANT
GOWN STRL REUS W/ TWL XL LVL3 (GOWN DISPOSABLE) ×1 IMPLANT
GOWN STRL REUS W/TWL XL LVL3 (GOWN DISPOSABLE) ×1
GRASPER SUT TROCAR 14GX15 (MISCELLANEOUS) IMPLANT
HEMOSTAT ARISTA ABSORB 3G PWDR (HEMOSTASIS) IMPLANT
HEMOSTAT SNOW SURGICEL 2X4 (HEMOSTASIS) IMPLANT
IRRIG SUCT STRYKERFLOW 2 WTIP (MISCELLANEOUS) ×1
IRRIGATION SUCT STRKRFLW 2 WTP (MISCELLANEOUS) ×1 IMPLANT
KIT BASIN OR (CUSTOM PROCEDURE TRAY) ×1 IMPLANT
KIT TURNOVER KIT A (KITS) IMPLANT
L-HOOK LAP DISP 36CM (ELECTROSURGICAL)
LHOOK LAP DISP 36CM (ELECTROSURGICAL) IMPLANT
POUCH RETRIEVAL ECOSAC 10 (ENDOMECHANICALS) ×1 IMPLANT
POUCH RETRIEVAL ECOSAC 10MM (ENDOMECHANICALS) ×1
SCISSORS LAP 5X35 DISP (ENDOMECHANICALS) ×1 IMPLANT
SET CHOLANGIOGRAPH MIX (MISCELLANEOUS) IMPLANT
SET TUBE SMOKE EVAC HIGH FLOW (TUBING) ×1 IMPLANT
SLEEVE Z-THREAD 5X100MM (TROCAR) ×2 IMPLANT
SPIKE FLUID TRANSFER (MISCELLANEOUS) ×1 IMPLANT
STRIP CLOSURE SKIN 1/2X4 (GAUZE/BANDAGES/DRESSINGS) ×1 IMPLANT
SUT MNCRL AB 4-0 PS2 18 (SUTURE) ×1 IMPLANT
SUT VIC AB 0 UR5 27 (SUTURE) IMPLANT
SUT VICRYL 0 TIES 12 18 (SUTURE) IMPLANT
SUT VICRYL 0 UR6 27IN ABS (SUTURE) IMPLANT
TOWEL OR 17X26 10 PK STRL BLUE (TOWEL DISPOSABLE) ×1 IMPLANT
TOWEL OR NON WOVEN STRL DISP B (DISPOSABLE) ×1 IMPLANT
TRAY LAPAROSCOPIC (CUSTOM PROCEDURE TRAY) ×1 IMPLANT
TROCAR 11X100 Z THREAD (TROCAR) IMPLANT
TROCAR BALLN 12MMX100 BLUNT (TROCAR) ×1 IMPLANT
TROCAR Z-THREAD OPTICAL 5X100M (TROCAR) ×1 IMPLANT

## 2022-10-07 NOTE — Anesthesia Preprocedure Evaluation (Addendum)
Anesthesia Evaluation  Patient identified by MRN, date of birth, ID band Patient awake    Reviewed: Allergy & Precautions, NPO status , Patient's Chart, lab work & pertinent test results  History of Anesthesia Complications (+) Family history of anesthesia reaction  Airway Mallampati: II  TM Distance: >3 FB Neck ROM: Full    Dental  (+) Partial Upper, Dental Advisory Given, Partial Lower, Missing   Pulmonary neg pulmonary ROS   Pulmonary exam normal breath sounds clear to auscultation       Cardiovascular hypertension, Pt. on medications and Pt. on home beta blockers Normal cardiovascular exam Rhythm:Regular Rate:Normal     Neuro/Psych  Headaches PSYCHIATRIC DISORDERS Anxiety        GI/Hepatic Neg liver ROS,GERD  ,,  Endo/Other  negative endocrine ROS    Renal/GU negative Renal ROS     Musculoskeletal  (+) Arthritis ,  Fibromyalgia -  Abdominal  (+) + obese  Peds  Hematology  (+) Blood dyscrasia, anemia   Anesthesia Other Findings   Reproductive/Obstetrics                             Anesthesia Physical Anesthesia Plan  ASA: 3  Anesthesia Plan: General   Post-op Pain Management: Tylenol PO (pre-op)* and Lidocaine infusion*   Induction: Intravenous  PONV Risk Score and Plan: 4 or greater and Ondansetron, Dexamethasone, Midazolam and Scopolamine patch - Pre-op  Airway Management Planned: Oral ETT  Additional Equipment:   Intra-op Plan:   Post-operative Plan: Extubation in OR  Informed Consent: I have reviewed the patients History and Physical, chart, labs and discussed the procedure including the risks, benefits and alternatives for the proposed anesthesia with the patient or authorized representative who has indicated his/her understanding and acceptance.     Dental advisory given  Plan Discussed with: CRNA  Anesthesia Plan Comments:         Anesthesia Quick  Evaluation

## 2022-10-07 NOTE — Transfer of Care (Signed)
Immediate Anesthesia Transfer of Care Note  Patient: Janice Hutchinson  Procedure(s) Performed: LAPAROSCOPIC CHOLECYSTECTOMY WITH ICG DYE, POSSIBLE INTRAOPERATIVE CHOLANGIOGRAM  Patient Location: PACU  Anesthesia Type:General  Level of Consciousness: awake, alert , and patient cooperative  Airway & Oxygen Therapy: Patient Spontanous Breathing and Patient connected to face mask oxygen  Post-op Assessment: Report given to RN  Post vital signs: Reviewed and stable  Last Vitals:  Vitals Value Taken Time  BP 116/68 10/07/22 1400  Temp    Pulse 60 10/07/22 1402  Resp 17 10/07/22 1402  SpO2 100 % 10/07/22 1402  Vitals shown include unvalidated device data.  Last Pain:  Vitals:   10/07/22 1055  TempSrc: Oral         Complications: No notable events documented.

## 2022-10-07 NOTE — Op Note (Signed)
Janice Hutchinson [id number] [birth year] 10/07/2022  Laparoscopic Cholecystectomy with near infrared fluorescent cholangiography procedure Note  Indications: This patient presents with symptomatic gallbladder disease and will undergo laparoscopic cholecystectomy.  Pre-operative Diagnosis: symptomatic cholelithiasis  Post-operative Diagnosis: Same, fitz hugh curtis adhesions    Surgeon: Greer Pickerel MD FACS  Assistants: none  Anesthesia: General endotracheal anesthesia  Procedure Details  The patient was seen again in the Holding Room. The risks, benefits, complications, treatment options, and expected outcomes were discussed with the patient. The possibilities of reaction to medication, pulmonary aspiration, perforation of viscus, bleeding, recurrent infection, finding a normal gallbladder, the need for additional procedures, failure to diagnose a condition, the possible need to convert to an open procedure, and creating a complication requiring transfusion or operation were discussed with the patient. The likelihood of improving the patient's symptoms with return to their baseline status is good.  The patient and/or family concurred with the proposed plan, giving informed consent. The site of surgery properly noted. The patient was taken to Operating Room, identified as Janice Hutchinson and the procedure verified as Laparoscopic Cholecystectomy with ICG dye.  A Time Out was held and the above information confirmed. Antibiotic prophylaxis was administered.    ICG dye was administered preoperatively.    General endotracheal anesthesia was then administered and tolerated well. After the induction, the abdomen was prepped with Chloraprep and draped in the sterile fashion. The patient was positioned in the supine position.  Local anesthetic agent was injected into the skin near the umbilicus and an incision made. We dissected down to the abdominal fascia with blunt dissection.  The fascia was  incised vertically and we entered the peritoneal cavity bluntly.  A pursestring suture of 0-Vicryl was placed around the fascial opening.  The Hasson cannula was inserted and secured with the stay suture.  Pneumoperitoneum was then created with CO2 and tolerated well without any adverse changes in the patient's vital signs. An 5-mm port was placed in the subxiphoid position.  Two 5-mm ports were placed in the right upper quadrant. All skin incisions were infiltrated with a local anesthetic agent before making the incision and placing the trocars.   We positioned the patient in reverse Trendelenburg, tilted slightly to the patient's left.  The patient had a fair amount of adhesions from the top of the right lobe of the liver to the right upper quadrant peritoneum consistent with Fitz-Hugh Curtis adhesions.  These were taken down with EndoShears in order to facilitate retraction of the gallbladder and lifting up the liver.  The gallbladder was identified, the fundus grasped and retracted cephalad. Adhesions were lysed bluntly and with the electrocautery where indicated, taking care not to injure any adjacent organs or viscus. The infundibulum was grasped and retracted laterally, exposing the peritoneum overlying the triangle of Calot. This was then divided and exposed in a blunt fashion. A critical view of the cystic duct and cystic artery was obtained.  The cystic duct was clearly identified and bluntly dissected circumferentially.  Utilizing the Stryker camera system near infrared fluorescent activity was visualized in the liver, cystic duct, common hepatic duct and common bile duct.  This served as a secondary confirmation of our anatomy.  The cystic duct was then ligated with hemolock clips and divided. The cystic artery (a small anterior branch and a slightly larger posterior branch both going into the gallbladder ) which had been identified & dissected free were ligated with hemolock clips and divided as  well.   The  gallbladder was dissected from the liver bed in retrograde fashion with the electrocautery.  There was some spillage of bile from the posterior wall the gallbladder as it was being mobilized off the liver plate.  There is no stone spillage.  The gallbladder was removed and placed in an Ecco sac.  The gallbladder and Ecco sac were then removed through the umbilical port site. The liver bed was irrigated and inspected. Hemostasis was achieved with the electrocautery. Copious irrigation was utilized and was repeatedly aspirated until clear.  The pursestring suture was used to close the umbilical fascia.  Additional interrupted 0 Vicryl was placed at the umbilical fascia using a PMI suture passer with laparoscopic guidance.  We again inspected the right upper quadrant for hemostasis.  The umbilical closure was inspected and there was no air leak and nothing trapped within the closure. Pneumoperitoneum was released as we removed the trocars.  4-0 Monocryl was used to close the skin.   Dermabond was applied. The patient was then extubated and brought to the recovery room in stable condition. Instrument, sponge, and needle counts were correct at closure and at the conclusion of the case.   Findings: Positive critical view Near infrared fluorescent cholangiography was visualized within the common hepatic duct, common bile duct, cystic duct, and small bowel. Adhesions around the right hepatic lobe to the right upper quadrant peritoneum consistent with Fitz-Hugh Curtis adhesions  Estimated Blood Loss: Minimal         Drains: none         Specimens: Gallbladder           Complications: None; patient tolerated the procedure well.         Disposition: PACU - hemodynamically stable.         Condition: stable  Leighton Ruff. Redmond Pulling, MD, FACS General, Bariatric, & Minimally Invasive Surgery [facility],  [place]

## 2022-10-07 NOTE — H&P (Signed)
REFERRING PHYSICIAN: Prochnau, Druscilla Brownie, MD  PROVIDER: Austine Kelsay Leanne Chang, MD  MRN: [id number] DOB: [birth year] DATE OF ENCOUNTER: 09/25/2022  Subjective  Chief Complaint: new patient (Symptomatic chole )   History of Present Illness: Janice Hutchinson is a 72 y.o. female who is seen today as an office consultation at the request of Dr. Laqueta Due for evaluation of new patient (Symptomatic chole ) .  She is accompanied by her husband. She states that she did not realize that she was having gallbladder issues. She reports that she has been having intermittent episodes of upper abdominal discomfort and specifically points to underneath her right rib cage. She describes it almost like chest pain of the abdomen but underneath the rib cage. It will occur randomly. It will last for minutes to 30 minutes to about an hour. It will radiate to her back and shoulder. It can be associated with bloating and nausea. She has nausea. She also reports some heartburn as well. No prior abdominal surgery. No diarrhea or constipation. No early satiety. No retching or vomiting. No frequent NSAID use. No melena or hematochezia. When asked if she had to pick 1 spot that bothers her the most she points underneath her right rib cage. SHe had an abdominal ultrasound which showed cholelithiasis.    Review of Systems: A complete review of systems was obtained from the patient. I have reviewed this information and discussed as appropriate with the patient. See HPI as well for other ROS.  ROS  Medical History: Past Medical History: Diagnosis Date Arthritis Hypertension  There is no problem list on file for this patient.  History reviewed. No pertinent surgical history.  No Known Allergies  Current Outpatient Medications on File Prior to Visit Medication Sig Dispense Refill amLODIPine (NORVASC) 2.5 MG tablet Take by mouth atorvastatin (LIPITOR) 10 MG tablet Take 10 mg by mouth once daily bisoprolol (ZEBETA) 10 MG  tablet Take by mouth ergocalciferol, vitamin D2, 1,250 mcg (50,000 unit) capsule Take 50,000 Units by mouth every 7 (seven) days hydroCHLOROthiazide (MICROZIDE) 12.5 mg capsule Take by mouth losartan (COZAAR) 100 MG tablet Take 100 mg by mouth once daily  No current facility-administered medications on file prior to visit.  Family History Problem Relation Age of Onset High blood pressure (Hypertension) Mother Breast cancer Mother High blood pressure (Hypertension) Father Deep vein thrombosis (DVT or abnormal blood clot formation) Sister Obesity Sister Diabetes Brother   Social History  Tobacco Use Smoking Status Never Smokeless Tobacco Never   Social History  Socioeconomic History Marital status: Married Tobacco Use Smoking status: Never Smokeless tobacco: Never Substance and Sexual Activity Alcohol use: Never Drug use: Never  Objective:  Vitals: 09/25/22 0928 BP: 134/72 Pulse: 62 Temp: 36.6 C (97.8 F) SpO2: 96% Weight: 92.5 kg (204 lb) Height: 167.6 cm ('5\' 6"'$ )  Body mass index is 32.93 kg/m.  Constitutional: NAD; conversant; no deformities Eyes: Moist conjunctiva; no lid lag; anicteric; PERRL Neck: Trachea midline; no thyromegaly Lungs: Normal respiratory effort; no tactile fremitus CV: RRR; no palpable thrills; no pitting edema GI: Abd, nontender, nondistended; no palpable hepatosplenomegaly MSK: Normal gait; no clubbing/cyanosis Psychiatric: Appropriate affect; alert and oriented x3 Lymphatic: No palpable cervical or axillary lymphadenopathy Skin: No rash, lesions or jaundice  Labs, Imaging and Diagnostic Testing: Obesity medicine note 09/18/22  PCP note 08/26/22  Renal u/s 08/01/22 -incidental shadowing gallstones unremarkable bilateral kidneys-no evidence of renal artery stenosis  Upper quadrant ultrasound September 04, 2022-cholelithiasis with no wall thickening, pericholecystic fluid, sludge or Murphy sign; common bile  duct 4 mm, liver  normal  Assessment and Plan:   Diagnoses and all orders for this visit:  Symptomatic cholelithiasis    I believe the patient's symptoms are consistent with gallbladder disease. We did discuss that there could be a component of reflux or heartburn as well which may be a separate process. However upon discussion several times in several different ways she complains mainly of pain underneath her right rib cage that radiates to her back therefore I think the majority of her symptoms are probably due to her gallbladder.  We discussed gallbladder disease. The patient was given Neurosurgeon. We discussed non-operative and operative management. We discussed the signs & symptoms of acute cholecystitis  I discussed laparoscopic cholecystectomy with possible IOC in detail. The patient was given educational material as well as diagrams detailing the procedure. We discussed the risks and benefits of a laparoscopic cholecystectomy including, but not limited to bleeding, infection, injury to surrounding structures such as the intestine or liver, bile leak, retained gallstones, need to convert to an open procedure, prolonged diarrhea, blood clots such as DVT, common bile duct injury, anesthesia risks, and possible need for additional procedures. We discussed the typical post-operative recovery course. I explained that the likelihood of improvement of their symptoms is good.  Moderate medical decision making. She has had progression of her right upper quadrant discomfort, moderate medical data review, decision regarding surgery  No follow-ups on file.  [name], MD General, Minimally Invasive, & Bariatric Surgery     Electronically signed by Rudean Curt, MD at 09/25/2022 10:08 AM EST

## 2022-10-07 NOTE — Discharge Instructions (Addendum)
[facility] LAPAROSCOPIC SURGERY: POST OP INSTRUCTIONS Always review your discharge instruction sheet given to you by the facility where your surgery was performed. IF YOU HAVE DISABILITY OR FAMILY LEAVE FORMS, YOU MUST BRING THEM TO THE OFFICE FOR PROCESSING.   DO NOT GIVE THEM TO YOUR DOCTOR.  PAIN CONTROL  First take acetaminophen (Tylenol) AND/or ibuprofen (Advil) to control your pain after surgery.  Follow directions on package.  Taking acetaminophen (Tylenol) and/or ibuprofen (Advil) regularly after surgery will help to control your pain and lower the amount of prescription pain medication you may need.  You should not take more than 3,000 mg (3 grams) of acetaminophen (Tylenol) in 24 hours.  You should not take ibuprofen (Advil), aleve, motrin, naprosyn or other NSAIDS if you have a history of stomach ulcers or chronic kidney disease.  A prescription for pain medication may be given to you upon discharge.  Take your pain medication as prescribed, if you still have uncontrolled pain after taking acetaminophen (Tylenol) or ibuprofen (Advil). Use ice packs to help control pain. If you need a refill on your pain medication, please contact your pharmacy.  They will contact our office to request authorization. Prescriptions will not be filled after 5pm or on week-ends.  HOME MEDICATIONS Take your usually prescribed medications unless otherwise directed.  DIET You should follow a light diet the first few days after arrival home.  Be sure to include lots of fluids daily. Avoid fatty, fried foods.   CONSTIPATION It is common to experience some constipation after surgery and if you are taking pain medication.  Increasing fluid intake and taking a stool softener (such as Colace) will usually help or prevent this problem from occurring.  A mild laxative (Milk of Magnesia or Miralax) should be taken according to package instructions if there are no bowel movements after 48  hours.  WOUND/INCISION CARE Most patients will experience some swelling and bruising in the area of the incisions.  Ice packs will help.  Swelling and bruising can take several days to resolve.  Unless discharge instructions indicate otherwise, follow guidelines below  STERI-STRIPS - you may remove your outer bandages 48 hours after surgery, and you may shower at that time.  You have steri-strips (small skin tapes) in place directly over the incision.  These strips should be left on the skin for 7-10 days.   DERMABOND/SKIN GLUE - you may shower in 24 hours.  The glue will flake off over the next 2-3 weeks. Any sutures or staples will be removed at the office during your follow-up visit.  ACTIVITIES You may resume regular (light) daily activities beginning the next day--such as daily self-care, walking, climbing stairs--gradually increasing activities as tolerated.  You may have sexual intercourse when it is comfortable.  Refrain from any heavy lifting or straining until approved by your doctor. You may drive when you are no longer taking prescription pain medication, you can comfortably wear a seatbelt, and you can safely maneuver your car and apply brakes.  FOLLOW-UP You should see your doctor in the office for a follow-up appointment approximately 2-3 weeks after your surgery.  You should have been given your post-op/follow-up appointment when your surgery was scheduled.  If you did not receive a post-op/follow-up appointment, make sure that you call for this appointment within a day or two after you arrive home to insure a convenient appointment time.  OTHER INSTRUCTIONS   WHEN TO CALL YOUR DOCTOR: Fever over 101.0 Inability to urinate Continued   bleeding from incision. Increased pain, redness, or drainage from the incision. Increasing abdominal pain  The clinic staff is available to answer your questions during regular business hours.  Please don't hesitate to call and ask to speak to  one of the nurses for clinical concerns.  If you have a medical emergency, go to the nearest emergency room or call 911.  A surgeon from [facility] is always on call at the hospital. [address], [address], [place], [place]  [address] ? [address] [address], [place], [place]   [address] [phone] ? [phone] ? FAX [phone] Web site: [email or web address]  

## 2022-10-07 NOTE — Interval H&P Note (Signed)
History and Physical Interval Note:  10/07/2022 12:27 PM  Janice Hutchinson  has presented today for surgery, with the diagnosis of SYMPTOMATIC CHOLELITHIASIS.  The various methods of treatment have been discussed with the patient and family. After consideration of risks, benefits and other options for treatment, the patient has consented to  Procedure(s): LAPAROSCOPIC CHOLECYSTECTOMY WITH ICG DYE, POSSIBLE INTRAOPERATIVE CHOLANGIOGRAM (N/A) as a surgical intervention.  The patient's history has been reviewed, patient examined, no change in status, stable for surgery.  I have reviewed the patient's chart and labs.  Questions were answered to the patient's satisfaction.     Janice Hutchinson

## 2022-10-07 NOTE — Anesthesia Procedure Notes (Signed)
Procedure Name: Intubation Date/Time: 10/07/2022 12:53 PM  Performed by: Eben Burow, CRNAPre-anesthesia Checklist: Patient identified, Emergency Drugs available, Suction available, Patient being monitored and Timeout performed Patient Re-evaluated:Patient Re-evaluated prior to induction Oxygen Delivery Method: Circle system utilized Preoxygenation: Pre-oxygenation with 100% oxygen Induction Type: IV induction Ventilation: Mask ventilation without difficulty Laryngoscope Size: Mac and 4 Grade View: Grade I Tube type: Oral Tube size: 7.0 mm Number of attempts: 1 Airway Equipment and Method: Stylet Placement Confirmation: ETT inserted through vocal cords under direct vision, positive ETCO2 and breath sounds checked- equal and bilateral Secured at: 21 cm Tube secured with: Tape Dental Injury: Teeth and Oropharynx as per pre-operative assessment

## 2022-10-08 ENCOUNTER — Encounter (HOSPITAL_COMMUNITY): Payer: Self-pay | Admitting: General Surgery

## 2022-10-08 LAB — SURGICAL PATHOLOGY

## 2022-10-08 NOTE — Anesthesia Postprocedure Evaluation (Signed)
Anesthesia Post Note  Patient: Janice Hutchinson  Procedure(s) Performed: LAPAROSCOPIC CHOLECYSTECTOMY WITH ICG DYE     Patient location during evaluation: PACU Anesthesia Type: General Level of consciousness: sedated and patient cooperative Pain management: pain level controlled Vital Signs Assessment: post-procedure vital signs reviewed and stable Respiratory status: spontaneous breathing Cardiovascular status: stable Anesthetic complications: no   No notable events documented.  Last Vitals:  Vitals:   10/07/22 1559 10/07/22 1600  BP: 102/69 109/74  Pulse:  (!) 58  Resp:    Temp:  (!) 36.4 C  SpO2:  96%    Last Pain:  Vitals:   10/07/22 1600  TempSrc:   PainSc: [place]

## 2022-10-13 ENCOUNTER — Ambulatory Visit (INDEPENDENT_AMBULATORY_CARE_PROVIDER_SITE_OTHER): Payer: Medicare Other | Admitting: Internal Medicine

## 2022-10-30 ENCOUNTER — Ambulatory Visit (INDEPENDENT_AMBULATORY_CARE_PROVIDER_SITE_OTHER): Payer: Medicare Other | Admitting: Internal Medicine

## 2022-10-30 ENCOUNTER — Encounter (INDEPENDENT_AMBULATORY_CARE_PROVIDER_SITE_OTHER): Payer: Self-pay | Admitting: Internal Medicine

## 2022-10-30 VITALS — BP 125/72 | HR 54 | Temp 98.1°F | Ht 66.0 in | Wt 197.0 lb

## 2022-10-30 DIAGNOSIS — R7303 Prediabetes: Secondary | ICD-10-CM

## 2022-10-30 DIAGNOSIS — Z6831 Body mass index (BMI) 31.0-31.9, adult: Secondary | ICD-10-CM

## 2022-10-30 DIAGNOSIS — I10 Essential (primary) hypertension: Secondary | ICD-10-CM

## 2022-10-30 DIAGNOSIS — R944 Abnormal results of kidney function studies: Secondary | ICD-10-CM

## 2022-10-30 DIAGNOSIS — E559 Vitamin D deficiency, unspecified: Secondary | ICD-10-CM

## 2022-10-30 DIAGNOSIS — E669 Obesity, unspecified: Secondary | ICD-10-CM

## 2022-10-30 NOTE — Assessment & Plan Note (Signed)
Most recent vitamin D levels  Lab Results  Component Value Date   VD25OH 14.1 (L) 05/29/2022   VD25OH 28.71 (L) 05/04/2014   VD25OH 34 05/04/2013     Deficiency state associated with adiposity and may result in leptin resistance, weight gain and fatigue. Currently on vitamin D supplementation without any adverse effects.  Plan: She will be having her vitamin D level checked by her PCP in the next 4 weeks.  She may be able to transition to over-the-counter supplementation if levels are replenished.

## 2022-10-30 NOTE — Assessment & Plan Note (Signed)
Most recent A1c is improved at Lab Results  Component Value Date   HGBA1C 4.7 (L) 05/29/2022   She has insulin resistance with a Homa IR score of 2.9.  She will continue with weight loss therapy and increase physical activity.  We discussed the benefits of pharmacoprophylaxis with metformin which she has declined.

## 2022-10-30 NOTE — Assessment & Plan Note (Signed)
Blood pressure has improved.  On bisoprolol, amlodipine and losartan and hydrochlorothiazide without adverse effects.    Continue with weight loss therapy.  Monitor for symptoms of orthostasis while losing weight. Continue current regimen and home monitoring for a goal blood pressure of 120/80.

## 2022-10-30 NOTE — Assessment & Plan Note (Signed)
GFR has improved with medication adjustments.  She will be having blood work done at her PCPs office and we will forward test results to [place].  She is maintaining adequate hydration and avoiding nephrotoxins.  Her blood pressure has also improved.

## 2022-10-30 NOTE — Progress Notes (Signed)
Office: [phone]  /  Fax: [phone]  WEIGHT SUMMARY AND BIOMETRICS  Vitals Temp: 98.1 F (36.7 C) BP: 125/72 Pulse Rate: (!) 54 SpO2: 95 %   Anthropometric Measurements Height: 5\' 6"  (1.676 m) Weight: 197 lb (89.4 kg) BMI (Calculated): 31.81 Weight at Last Visit: 199 lb Weight Lost Since Last Visit: 2 lb Starting Weight: 215 lb Total Weight Loss (lbs): 18 lb (8.165 kg) Peak Weight: 217 lb   Body Composition  Body Fat %: 44.2 % Fat Mass (lbs): 87 lbs Muscle Mass (lbs): 104.4 lbs Total Body Water (lbs): 72.2 lbs Visceral Fat Rating : 13    HPI  Chief Complaint: OBESITY  Janice Hutchinson is here to discuss her progress with her obesity treatment plan. She is on the following a lower carbohydrate, vegetable and lean protein rich diet plan and states she is following her eating plan approximately 70 % of the time. She states she is exercising 30 minutes 3 times per week.  Interval History:  Since last office visit she has lost 2 lbs.Had GB surgery She reports good adherence to reduced calorie nutritional plan. She has been working on reading food labels, not skipping meals, increasing protein at every meal, eating more fruits, eating more vegetables, drinking more water, journaling and tracking calories, and making healthier choices Denies problems with appetite and hunger signals.  Denies problems with satiety and satiation.  Denies problems with eating patterns and portion control.  Denies abnormal cravings. Denies feeling deprived or restricted.   Barriers identified: none.   Pharmacotherapy for weight loss: She is currently taking no anti-obesity medication.    ASSESSMENT AND PLAN  TREATMENT PLAN FOR OBESITY:  Recommended Dietary Goals  [name] is currently in the action stage of change. As such, her goal is to continue weight management plan. She has agreed to: continue current plan  Behavioral Intervention  We discussed the following Behavioral  Modification Strategies today:  focus on plant-based nutirtion and lean sources of protein .  Additional resources provided today: None  Recommended Physical Activity Goals  [name] has been advised to work up to 150 minutes of moderate intensity aerobic activity a week and strengthening exercises 2-3 times per week for cardiovascular health, weight loss maintenance and preservation of muscle mass.   She has agreed to :  Think about ways to increase physical activity  Pharmacotherapy We discussed various medication options to help [name] with her weight loss efforts and we both agreed to :  declined pharmacotherapy  ASSOCIATED CONDITIONS ADDRESSED TODAY  Prediabetes Assessment & Plan: Most recent A1c is improved at Lab Results  Component Value Date   HGBA1C 4.7 (L) 05/29/2022   She has insulin resistance with a Homa IR score of 2.9.  She will continue with weight loss therapy and increase physical activity.  We discussed the benefits of pharmacoprophylaxis with metformin which she has declined.    Hypertension, essential Assessment & Plan: Blood pressure has improved.  On bisoprolol, amlodipine and losartan and hydrochlorothiazide without adverse effects.    Continue with weight loss therapy.  Monitor for symptoms of orthostasis while losing weight. Continue current regimen and home monitoring for a goal blood pressure of 120/80.      Obesity, current BMI 31  Vitamin D deficiency Assessment & Plan: Most recent vitamin D levels  Lab Results  Component Value Date   VD25OH 14.1 (L) 05/29/2022   VD25OH 28.71 (L) 05/04/2014   VD25OH 34 05/04/2013     Deficiency state associated with adiposity and  may result in leptin resistance, weight gain and fatigue. Currently on vitamin D supplementation without any adverse effects.  Plan: She will be having her vitamin D level checked by her PCP in the next 4 weeks.  She may be able to transition to over-the-counter supplementation if  levels are replenished.    Decreased GFR Assessment & Plan: GFR has improved with medication adjustments.  She will be having blood work done at her PCPs office and we will forward test results to [place].  She is maintaining adequate hydration and avoiding nephrotoxins.  Her blood pressure has also improved.      PHYSICAL EXAM:  Blood pressure 125/72, pulse (!) 54, temperature 98.1 F (36.7 C), height 5\' 6"  (1.676 m), weight 197 lb (89.4 kg), SpO2 95 %. Body mass index is 31.8 kg/m.  General: She is overweight, cooperative, alert, well developed, and in no acute distress. PSYCH: Has normal mood, affect and thought process.   HEENT: EOMI, sclerae are anicteric. Lungs: Normal breathing effort, no conversational dyspnea. Extremities: No edema.  Neurologic: No gross sensory or motor deficits. No tremors or fasciculations noted.    DIAGNOSTIC DATA REVIEWED:  BMET    Component Value Date/Time   NA 138 10/01/2022 1045   NA 139 05/29/2022 1600   K 4.0 10/01/2022 1045   CL 109 10/01/2022 1045   CO2 26 10/01/2022 1045   GLUCOSE 84 10/01/2022 1045   BUN 33 (H) 10/01/2022 1045   BUN 28 (H) 05/29/2022 1600   CREATININE 1.05 (H) 10/01/2022 1045   CALCIUM 9.3 10/01/2022 1045   GFRNONAA 57 (L) 10/01/2022 1045   GFRAA >60 03/17/2017 1048   Lab Results  Component Value Date   HGBA1C 4.7 (L) 05/29/2022   Lab Results  Component Value Date   INSULIN 12.1 05/01/2022   Lab Results  Component Value Date   TSH 0.58 05/04/2014   CBC    Component Value Date/Time   WBC 4.8 10/01/2022 1045   RBC 5.55 (H) 10/01/2022 1045   HGB 14.4 10/01/2022 1045   HGB 15.3 05/29/2022 1600   HCT 46.3 (H) 10/01/2022 1045   HCT 47.4 (H) 05/29/2022 1600   PLT 204 10/01/2022 1045   MCV 83.4 10/01/2022 1045   MCV 80 05/29/2022 1600   MCH 25.9 (L) 10/01/2022 1045   MCHC 31.1 10/01/2022 1045   RDW 14.8 10/01/2022 1045   RDW 14.3 05/29/2022 1600   Iron Studies No results found for: "IRON", "TIBC",  "FERRITIN", "IRONPCTSAT" Lipid Panel     Component Value Date/Time   CHOL 181 05/01/2022 1222   TRIG 91 05/01/2022 1222   HDL 52 05/01/2022 1222   CHOLHDL 4 05/04/2014 1005   VLDL 15.0 05/04/2014 1005   LDLCALC 112 (H) 05/01/2022 1222   Hepatic Function Panel     Component Value Date/Time   PROT 7.3 10/01/2022 1045   PROT 7.4 05/29/2022 1600   ALBUMIN 3.7 10/01/2022 1045   ALBUMIN 4.2 05/29/2022 1600   AST 23 10/01/2022 1045   ALT 28 10/01/2022 1045   ALKPHOS 81 10/01/2022 1045   BILITOT 1.2 10/01/2022 1045   BILITOT 0.8 05/29/2022 1600   BILIDIR 0.3 (H) 10/01/2022 1045   IBILI 0.9 10/01/2022 1045      Component Value Date/Time   TSH 0.58 05/04/2014 1005   Nutritional Lab Results  Component Value Date   VD25OH 14.1 (L) 05/29/2022   VD25OH 28.71 (L) 05/04/2014   VD25OH 34 05/04/2013     Return in about 4 weeks (  around 11/27/2022) for For Weight Mangement with Dr. Gerarda Fraction.Marland Kitchen She was informed of the importance of frequent follow up visits to maximize her success with intensive lifestyle modifications for her multiple health conditions.   ATTESTASTION STATEMENTS:  Reviewed by clinician on day of visit: allergies, medications, problem list, medical history, surgical history, family history, social history, and previous encounter notes.     Thomes Dinning, MD

## 2022-11-03 DIAGNOSIS — Z79899 Other long term (current) drug therapy: Secondary | ICD-10-CM | POA: Diagnosis not present

## 2022-11-03 DIAGNOSIS — E785 Hyperlipidemia, unspecified: Secondary | ICD-10-CM | POA: Diagnosis not present

## 2022-11-03 DIAGNOSIS — R001 Bradycardia, unspecified: Secondary | ICD-10-CM | POA: Diagnosis not present

## 2022-11-03 DIAGNOSIS — I700 Atherosclerosis of aorta: Secondary | ICD-10-CM | POA: Diagnosis not present

## 2022-11-03 DIAGNOSIS — R110 Nausea: Secondary | ICD-10-CM | POA: Diagnosis not present

## 2022-11-03 DIAGNOSIS — I10 Essential (primary) hypertension: Secondary | ICD-10-CM | POA: Diagnosis not present

## 2022-11-24 DIAGNOSIS — R17 Unspecified jaundice: Secondary | ICD-10-CM | POA: Diagnosis not present

## 2022-12-01 ENCOUNTER — Ambulatory Visit (INDEPENDENT_AMBULATORY_CARE_PROVIDER_SITE_OTHER): Payer: Medicare Other | Admitting: Internal Medicine

## 2022-12-01 ENCOUNTER — Encounter (INDEPENDENT_AMBULATORY_CARE_PROVIDER_SITE_OTHER): Payer: Self-pay

## 2022-12-01 ENCOUNTER — Encounter (INDEPENDENT_AMBULATORY_CARE_PROVIDER_SITE_OTHER): Payer: Self-pay | Admitting: Internal Medicine

## 2022-12-01 VITALS — BP 130/75 | HR 55 | Temp 98.2°F | Ht 66.0 in | Wt 196.0 lb

## 2022-12-01 DIAGNOSIS — E559 Vitamin D deficiency, unspecified: Secondary | ICD-10-CM | POA: Diagnosis not present

## 2022-12-01 DIAGNOSIS — Z6831 Body mass index (BMI) 31.0-31.9, adult: Secondary | ICD-10-CM

## 2022-12-01 DIAGNOSIS — I10 Essential (primary) hypertension: Secondary | ICD-10-CM | POA: Diagnosis not present

## 2022-12-01 DIAGNOSIS — R7303 Prediabetes: Secondary | ICD-10-CM

## 2022-12-01 DIAGNOSIS — E669 Obesity, unspecified: Secondary | ICD-10-CM | POA: Diagnosis not present

## 2022-12-01 NOTE — Assessment & Plan Note (Signed)
Most recent A1c is improved at Lab Results  Component Value Date   HGBA1C 4.7 (L) 05/29/2022   She has insulin resistance with a Homa IR score of 2.9.  She will continue with weight loss therapy and increase physical activity.  We discussed the benefits of pharmacoprophylaxis with metformin which she has declined.  

## 2022-12-01 NOTE — Assessment & Plan Note (Signed)
Blood pressure has improved.  On bisoprolol, amlodipine and losartan and hydrochlorothiazide without adverse effects.    Continue with weight loss therapy.  Monitor for symptoms of orthostasis while losing weight. Continue current regimen and home monitoring for a goal blood pressure of 120/80.    

## 2022-12-01 NOTE — Progress Notes (Signed)
Office: [phone]  /  Fax: [phone]  WEIGHT SUMMARY AND BIOMETRICS  Vitals Temp: 98.2 F (36.8 C) BP: 130/75 Pulse Rate: (!) 55 SpO2: 100 %   Anthropometric Measurements Height: 5\' 6"  (1.676 m) Weight: 196 lb (88.9 kg) BMI (Calculated): 31.65 Weight at Last Visit: 197 lb Weight Lost Since Last Visit: 1 lb Starting Weight: 215 lb Total Weight Loss (lbs): 19 lb (8.618 kg) Peak Weight: 217 lb   Body Composition  Body Fat %: 42.8 % Fat Mass (lbs): 84 lbs Muscle Mass (lbs): 106.6 lbs Total Body Water (lbs): 73 lbs Visceral Fat Rating : 13    No data recorded Today's Visit #: 11  Starting Date: 05/01/22   HPI  Chief Complaint: OBESITY  Janice Hutchinson is here to discuss her progress with her obesity treatment plan. She is on the following a lower carbohydrate, vegetable and lean protein rich diet plan and states she is following her eating plan approximately 95 % of the time. She states she is exercising 40 minutes 3 times per week.  Interval History:  Since last office visit she has lost 1 pound.  Total she has lost close to 10% of body weight. She reports good adherence to reduced calorie nutritional plan. She has been working on reading food labels, increasing protein intake at every meal, eating more fruits, eating more vegetables, and drinking more water Denies problems with appetite and hunger signals.  Denies problems with satiety and satiation.  Denies problems with eating patterns and portion control.  Denies abnormal cravings. Denies feeling deprived or restricted.   Barriers identified: none.   Pharmacotherapy for weight loss: She is currently taking no anti-obesity medication.    ASSESSMENT AND PLAN  TREATMENT PLAN FOR OBESITY:  Recommended Dietary Goals  Janice Hutchinson is currently in the action stage of change. As such, her goal is to continue weight management plan. She has agreed to: continue current plan  Behavioral Intervention  We discussed  the following Behavioral Modification Strategies today: increasing lean protein intake, decreasing simple carbohydrates , increasing vegetables, increasing lower glycemic fruits, increasing water intake, continue to practice mindfulness when eating, and planning for success.  Additional resources provided today: None  Recommended Physical Activity Goals  Janice Hutchinson has been advised to work up to 150 minutes of moderate intensity aerobic activity a week and strengthening exercises 2-3 times per week for cardiovascular health, weight loss maintenance and preservation of muscle mass.   She has agreed to :  Think about ways to increase physical activity  Pharmacotherapy We discussed various medication options to help Janice Hutchinson with her weight loss efforts and we both agreed to : continue with nutritional and behavioral strategies  ASSOCIATED CONDITIONS ADDRESSED TODAY  Hypertension, essential Assessment & Plan: Blood pressure has improved.  On bisoprolol, amlodipine and losartan and hydrochlorothiazide without adverse effects.    Continue with weight loss therapy.  Monitor for symptoms of orthostasis while losing weight. Continue current regimen and home monitoring for a goal blood pressure of 120/80.      Prediabetes Assessment & Plan: Most recent A1c is improved at Lab Results  Component Value Date   HGBA1C 4.7 (L) 05/29/2022   She has insulin resistance with a Homa IR score of 2.9.  She will continue with weight loss therapy and increase physical activity.  We discussed the benefits of pharmacoprophylaxis with metformin which she has declined.   Orders: -     VITAMIN D 25 Hydroxy (Vit-D Deficiency, Fractures) -     Glucose,  fasting -     Insulin, random  Obesity, current BMI 31  Vitamin D deficiency Assessment & Plan: Most recent vitamin D levels  Lab Results  Component Value Date   VD25OH 14.1 (L) 05/29/2022   VD25OH 28.71 (L) 05/04/2014   VD25OH 34 05/04/2013      Deficiency state associated with adiposity and may result in leptin resistance, weight gain and fatigue. Currently on vitamin D supplementation without any adverse effects.  Plan: Check vitamin D levels today and transition to over-the-counter supplementation if appropriate   Orders: -     VITAMIN D 25 Hydroxy (Vit-D Deficiency, Fractures)    PHYSICAL EXAM:  Blood pressure 130/75, pulse (!) 55, temperature 98.2 F (36.8 C), height 5\' 6"  (1.676 m), weight 196 lb (88.9 kg), SpO2 100 %. Body mass index is 31.64 kg/m.  General: She is overweight, cooperative, alert, well developed, and in no acute distress. PSYCH: Has normal mood, affect and thought process.   HEENT: EOMI, sclerae are anicteric. Lungs: Normal breathing effort, no conversational dyspnea. Extremities: No edema.  Neurologic: No gross sensory or motor deficits. No tremors or fasciculations noted.    DIAGNOSTIC DATA REVIEWED:  BMET    Component Value Date/Time   NA 138 10/01/2022 1045   NA 139 05/29/2022 1600   K 4.0 10/01/2022 1045   CL 109 10/01/2022 1045   CO2 26 10/01/2022 1045   GLUCOSE 84 10/01/2022 1045   BUN 33 (H) 10/01/2022 1045   BUN 28 (H) 05/29/2022 1600   CREATININE 1.05 (H) 10/01/2022 1045   CALCIUM 9.3 10/01/2022 1045   GFRNONAA 57 (L) 10/01/2022 1045   GFRAA >60 03/17/2017 1048   Lab Results  Component Value Date   HGBA1C 4.7 (L) 05/29/2022   Lab Results  Component Value Date   INSULIN 12.1 05/01/2022   Lab Results  Component Value Date   TSH 0.58 05/04/2014   CBC    Component Value Date/Time   WBC 4.8 10/01/2022 1045   RBC 5.55 (H) 10/01/2022 1045   HGB 14.4 10/01/2022 1045   HGB 15.3 05/29/2022 1600   HCT 46.3 (H) 10/01/2022 1045   HCT 47.4 (H) 05/29/2022 1600   PLT 204 10/01/2022 1045   MCV 83.4 10/01/2022 1045   MCV 80 05/29/2022 1600   MCH 25.9 (L) 10/01/2022 1045   MCHC 31.1 10/01/2022 1045   RDW 14.8 10/01/2022 1045   RDW 14.3 05/29/2022 1600   Iron Studies No  results found for: "IRON", "TIBC", "FERRITIN", "IRONPCTSAT" Lipid Panel     Component Value Date/Time   CHOL 181 05/01/2022 1222   TRIG 91 05/01/2022 1222   HDL 52 05/01/2022 1222   CHOLHDL 4 05/04/2014 1005   VLDL 15.0 05/04/2014 1005   LDLCALC 112 (H) 05/01/2022 1222   Hepatic Function Panel     Component Value Date/Time   PROT 7.3 10/01/2022 1045   PROT 7.4 05/29/2022 1600   ALBUMIN 3.7 10/01/2022 1045   ALBUMIN 4.2 05/29/2022 1600   AST 23 10/01/2022 1045   ALT 28 10/01/2022 1045   ALKPHOS 81 10/01/2022 1045   BILITOT 1.2 10/01/2022 1045   BILITOT 0.8 05/29/2022 1600   BILIDIR 0.3 (H) 10/01/2022 1045   IBILI 0.9 10/01/2022 1045      Component Value Date/Time   TSH 0.58 05/04/2014 1005   Nutritional Lab Results  Component Value Date   VD25OH 14.1 (L) 05/29/2022   VD25OH 28.71 (L) 05/04/2014   VD25OH 34 05/04/2013     Return in  about 3 weeks (around 12/22/2022) for For Weight Mangement with Dr. Rikki Spearing.Marland Kitchen She was informed of the importance of frequent follow up visits to maximize her success with intensive lifestyle modifications for her multiple health conditions.   ATTESTASTION STATEMENTS:  Reviewed by clinician on day of visit: allergies, medications, problem list, medical history, surgical history, family history, social history, and previous encounter notes.     Worthy Rancher, MD

## 2022-12-01 NOTE — Assessment & Plan Note (Signed)
Most recent vitamin D levels  Lab Results  Component Value Date   VD25OH 14.1 (L) 05/29/2022   VD25OH 28.71 (L) 05/04/2014   VD25OH 34 05/04/2013     Deficiency state associated with adiposity and may result in leptin resistance, weight gain and fatigue. Currently on vitamin D supplementation without any adverse effects.  Plan: Check vitamin D levels today and transition to over-the-counter supplementation if appropriate

## 2022-12-02 LAB — VITAMIN D 25 HYDROXY (VIT D DEFICIENCY, FRACTURES): Vit D, 25-Hydroxy: 45.5 ng/mL (ref 30.0–100.0)

## 2022-12-02 LAB — INSULIN, RANDOM: INSULIN: 15 u[IU]/mL (ref 2.6–24.9)

## 2022-12-25 ENCOUNTER — Ambulatory Visit (INDEPENDENT_AMBULATORY_CARE_PROVIDER_SITE_OTHER): Payer: Medicare Other | Admitting: Internal Medicine

## 2022-12-25 ENCOUNTER — Encounter (INDEPENDENT_AMBULATORY_CARE_PROVIDER_SITE_OTHER): Payer: Self-pay | Admitting: Internal Medicine

## 2022-12-25 VITALS — BP 135/68 | HR 81 | Temp 98.1°F | Ht 66.0 in | Wt 198.0 lb

## 2022-12-25 DIAGNOSIS — R7303 Prediabetes: Secondary | ICD-10-CM

## 2022-12-25 DIAGNOSIS — I10 Essential (primary) hypertension: Secondary | ICD-10-CM

## 2022-12-25 DIAGNOSIS — Z6831 Body mass index (BMI) 31.0-31.9, adult: Secondary | ICD-10-CM | POA: Diagnosis not present

## 2022-12-25 DIAGNOSIS — E669 Obesity, unspecified: Secondary | ICD-10-CM | POA: Diagnosis not present

## 2022-12-25 NOTE — Assessment & Plan Note (Signed)
Most recent A1c is improved at Lab Results  Component Value Date   HGBA1C 4.7 (L) 05/29/2022   She has insulin resistance with a Homa IR score of 2.9.  She will continue with weight loss therapy and increase physical activity.  We discussed the benefits of pharmacoprophylaxis with metformin which she has declined.  

## 2022-12-25 NOTE — Assessment & Plan Note (Signed)
Blood pressure has improved.  On bisoprolol, amlodipine and losartan and hydrochlorothiazide without adverse effects.    Continue with weight loss therapy.  Monitor for symptoms of orthostasis while losing weight. Continue current regimen and home monitoring for a goal blood pressure of 120/80.    

## 2022-12-25 NOTE — Progress Notes (Signed)
Office: [phone]  /  Fax: [phone]  WEIGHT SUMMARY AND BIOMETRICS  Vitals Temp: 98.1 F (36.7 C) BP: 135/68 Pulse Rate: 81 SpO2: 100 %   Anthropometric Measurements Height: 5\' 6"  (1.676 m) Weight: 198 lb (89.8 kg) BMI (Calculated): 31.97 Weight at Last Visit: 196lb Weight Lost Since Last Visit: 0 Weight Gained Since Last Visit: 2lb Starting Weight: 215lb Total Weight Loss (lbs): 17 lb (7.711 kg) Peak Weight: 217lb   Body Composition  Body Fat %: 43.9 % Fat Mass (lbs): 87 lbs Muscle Mass (lbs): 105.4 lbs Total Body Water (lbs): 74 lbs Visceral Fat Rating : 13    No data recorded Today's Visit #: 12  Starting Date: 05/01/22   HPI  Chief Complaint: OBESITY  Janice Hutchinson is here to discuss her progress with her obesity treatment plan. She is on the following a lower carbohydrate, vegetable and lean protein rich diet plan and states she is following her eating plan approximately 90 % of the time. She states she is exercising by walking 40 minutes 3 times per week.  Interval History:  Since last office visit she has gained 2 lbs. No change in diet, increased physical activity.   She reports good adherence to reduced calorie nutritional plan. She has been working on reading food labels, not skipping meals, increasing protein intake at every meal, eating more fruits, eating more vegetables, drinking more water, and avoiding and or reducing liquid calories. Acknowledges skipping lunch due to work recently. Eating out less and cooking more.  She may not be getting the recommended amount of protein. Denies problems with appetite and hunger signals.  Denies problems with satiety and satiation.  Denies problems with eating patterns and portion control.  Denies abnormal cravings. Denies feeling deprived or restricted.   Barriers identified: work schedule.   Pharmacotherapy for weight loss: She is currently taking no anti-obesity medication.    ASSESSMENT AND  PLAN  TREATMENT PLAN FOR OBESITY:  Recommended Dietary Goals  [name] is currently in the action stage of change. As such, her goal is to continue weight management plan. She has agreed to: continue current plan.  Provided patient with alternative breakfast ideas via [email or web address]  She will also focus on not skipping lunch and ensuring that she is getting at least 25 to 40 g of protein per meal which I think has declined this may explain her recent decline in weight loss.  Behavioral Intervention  We discussed the following Behavioral Modification Strategies today: increasing lean protein intake, decreasing simple carbohydrates , increasing vegetables, increasing lower glycemic fruits, increasing water intake, work on tracking and journaling calories using tracking application, reading food labels , and planning for success.  Additional resources provided today: None  Recommended Physical Activity Goals  [name] has been advised to work up to 150 minutes of moderate intensity aerobic activity a week and strengthening exercises 2-3 times per week for cardiovascular health, weight loss maintenance and preservation of muscle mass.   She has agreed to :  Think about ways to increase daily physical activity and overcoming barriers to exercise.   Pharmacotherapy We discussed various medication options to help [name] with her weight loss efforts and we both agreed to : continue with nutritional and behavioral strategies  ASSOCIATED CONDITIONS ADDRESSED TODAY  Hypertension, essential Assessment & Plan: Blood pressure has improved.  On bisoprolol, amlodipine and losartan and hydrochlorothiazide without adverse effects.    Continue with weight loss therapy.  Monitor for symptoms of orthostasis while losing weight. Continue  current regimen and home monitoring for a goal blood pressure of 120/80.      Prediabetes Assessment & Plan: Most recent A1c is improved at Lab Results  Component Value  Date   HGBA1C 4.7 (L) 05/29/2022   She has insulin resistance with a Homa IR score of 2.9.  She will continue with weight loss therapy and increase physical activity.  We discussed the benefits of pharmacoprophylaxis with metformin which she has declined.    Obesity, current BMI 31    PHYSICAL EXAM:  Blood pressure 135/68, pulse 81, temperature 98.1 F (36.7 C), height 5\' 6"  (1.676 m), weight 198 lb (89.8 kg), SpO2 100 %. Body mass index is 31.96 kg/m.  General: She is overweight, cooperative, alert, well developed, and in no acute distress. PSYCH: Has normal mood, affect and thought process.   HEENT: EOMI, sclerae are anicteric. Lungs: Normal breathing effort, no conversational dyspnea. Extremities: No edema.  Neurologic: No gross sensory or motor deficits. No tremors or fasciculations noted.    DIAGNOSTIC DATA REVIEWED:  BMET    Component Value Date/Time   NA 138 10/01/2022 1045   NA 139 05/29/2022 1600   K 4.0 10/01/2022 1045   CL 109 10/01/2022 1045   CO2 26 10/01/2022 1045   GLUCOSE 84 10/01/2022 1045   BUN 33 (H) 10/01/2022 1045   BUN 28 (H) 05/29/2022 1600   CREATININE 1.05 (H) 10/01/2022 1045   CALCIUM 9.3 10/01/2022 1045   GFRNONAA 57 (L) 10/01/2022 1045   GFRAA >60 03/17/2017 1048   Lab Results  Component Value Date   HGBA1C 4.7 (L) 05/29/2022   Lab Results  Component Value Date   INSULIN 15.0 12/01/2022   INSULIN 12.1 05/01/2022   Lab Results  Component Value Date   TSH 0.58 05/04/2014   CBC    Component Value Date/Time   WBC 4.8 10/01/2022 1045   RBC 5.55 (H) 10/01/2022 1045   HGB 14.4 10/01/2022 1045   HGB 15.3 05/29/2022 1600   HCT 46.3 (H) 10/01/2022 1045   HCT 47.4 (H) 05/29/2022 1600   PLT 204 10/01/2022 1045   MCV 83.4 10/01/2022 1045   MCV 80 05/29/2022 1600   MCH 25.9 (L) 10/01/2022 1045   MCHC 31.1 10/01/2022 1045   RDW 14.8 10/01/2022 1045   RDW 14.3 05/29/2022 1600   Iron Studies No results found for: "IRON", "TIBC",  "FERRITIN", "IRONPCTSAT" Lipid Panel     Component Value Date/Time   CHOL 181 05/01/2022 1222   TRIG 91 05/01/2022 1222   HDL 52 05/01/2022 1222   CHOLHDL 4 05/04/2014 1005   VLDL 15.0 05/04/2014 1005   LDLCALC 112 (H) 05/01/2022 1222   Hepatic Function Panel     Component Value Date/Time   PROT 7.3 10/01/2022 1045   PROT 7.4 05/29/2022 1600   ALBUMIN 3.7 10/01/2022 1045   ALBUMIN 4.2 05/29/2022 1600   AST 23 10/01/2022 1045   ALT 28 10/01/2022 1045   ALKPHOS 81 10/01/2022 1045   BILITOT 1.2 10/01/2022 1045   BILITOT 0.8 05/29/2022 1600   BILIDIR 0.3 (H) 10/01/2022 1045   IBILI 0.9 10/01/2022 1045      Component Value Date/Time   TSH 0.58 05/04/2014 1005   Nutritional Lab Results  Component Value Date   VD25OH 45.5 12/01/2022   VD25OH 14.1 (L) 05/29/2022   VD25OH 28.71 (L) 05/04/2014     Return in about 3 weeks (around 01/15/2023) for For Weight Mangement with Dr. Rikki Spearing - 40 minutes.. She was informed  of the importance of frequent follow up visits to maximize her success with intensive lifestyle modifications for her multiple health conditions.   ATTESTASTION STATEMENTS:  Reviewed by clinician on day of visit: allergies, medications, problem list, medical history, surgical history, family history, social history, and previous encounter notes.     Worthy Rancher, MD

## 2023-01-14 ENCOUNTER — Encounter (INDEPENDENT_AMBULATORY_CARE_PROVIDER_SITE_OTHER): Payer: Self-pay | Admitting: Internal Medicine

## 2023-01-14 ENCOUNTER — Ambulatory Visit (INDEPENDENT_AMBULATORY_CARE_PROVIDER_SITE_OTHER): Payer: Medicare Other | Admitting: Internal Medicine

## 2023-01-14 VITALS — BP 128/62 | HR 55 | Temp 98.0°F | Ht 66.0 in | Wt 198.0 lb

## 2023-01-14 DIAGNOSIS — Z6831 Body mass index (BMI) 31.0-31.9, adult: Secondary | ICD-10-CM

## 2023-01-14 DIAGNOSIS — E66812 Obesity, class 2: Secondary | ICD-10-CM

## 2023-01-14 DIAGNOSIS — E669 Obesity, unspecified: Secondary | ICD-10-CM | POA: Diagnosis not present

## 2023-01-14 DIAGNOSIS — I10 Essential (primary) hypertension: Secondary | ICD-10-CM | POA: Diagnosis not present

## 2023-01-14 DIAGNOSIS — R600 Localized edema: Secondary | ICD-10-CM | POA: Diagnosis not present

## 2023-01-14 NOTE — Assessment & Plan Note (Signed)
Blood pressure has improved.  On bisoprolol, amlodipine and losartan and hydrochlorothiazide without adverse effects.    Continue with weight loss therapy.  Monitor for symptoms of orthostasis while losing weight. Continue current regimen and home monitoring for a goal blood pressure of 120/80.    

## 2023-01-14 NOTE — Assessment & Plan Note (Addendum)
Worsening, Likely secondary to CCB. No signs or symptoms of HF. May benefit PRN loop diuretic. Will try compression garments.

## 2023-01-14 NOTE — Progress Notes (Signed)
Office: [phone]  /  Fax: [phone]  WEIGHT SUMMARY AND BIOMETRICS  Vitals Temp: 98 F (36.7 C) BP: 128/62 Pulse Rate: (!) 55 SpO2: 100 %   Anthropometric Measurements Height: 5\' 6"  (1.676 m) Weight: 198 lb (89.8 kg) BMI (Calculated): 31.97 Weight at Last Visit: 198 lb Weight Lost Since Last Visit: 0 lb Weight Gained Since Last Visit: 0 lb Starting Weight: 215 lb Total Weight Loss (lbs): 17 lb (7.711 kg) Peak Weight: 217 lb   Body Composition  Body Fat %: 44.1 % Fat Mass (lbs): 87.4 lbs Muscle Mass (lbs): 105.2 lbs Total Body Water (lbs): 76.8 lbs Visceral Fat Rating : 13    No data recorded Today's Visit #: 13  Starting Date: 05/01/22   HPI  Chief Complaint: OBESITY  Janice Hutchinson is here to discuss her progress with her obesity treatment plan. She is on the following a lower carbohydrate, vegetable and lean protein rich diet plan and states she is following her eating plan approximately 95 % of the time. She states she is exercising 40 minutes 4 times per week.  Interval History:  Since last office visit she has maintained. Started with personal trainer and working on progression. She is somewhat frustrated with rate of weight loss. She reports good adherence to reduced calorie nutritional plan. She has been working on reading food labels, not skipping meals, increasing protein intake at every meal, eating more fruits, eating more vegetables, drinking more water, making healthier choices, begun to exercise, and controlling orexigenic cues and stimuli  Orixegenic Control: Denies problems with appetite and hunger signals.  Denies problems with satiety and satiation.  Denies problems with eating patterns and portion control.  Denies abnormal cravings. Denies feeling deprived or restricted.   Barriers identified: none.   Pharmacotherapy for weight loss: She is currently taking no anti-obesity medication.    ASSESSMENT AND PLAN  TREATMENT PLAN FOR  OBESITY:  Recommended Dietary Goals  Janice Hutchinson is currently in the action stage of change. As such, her goal is to continue weight management plan. She has agreed to: continue current plan  Behavioral Intervention  We discussed the following Behavioral Modification Strategies today: increasing lean protein intake, decreasing simple carbohydrates , increasing vegetables, increasing lower glycemic fruits, increasing fiber rich foods, increasing water intake, continue to practice mindfulness when eating, and planning for success.  Additional resources provided today: None  Recommended Physical Activity Goals  Janice Hutchinson has been advised to work up to 150 minutes of moderate intensity aerobic activity a week and strengthening exercises 2-3 times per week for cardiovascular health, weight loss maintenance and preservation of muscle mass.   She has agreed to :  Start strengthening exercises with a goal of 2-3 sessions a week  and Start aerobic activity with a goal of 150 minutes a week at moderate intensity.   Pharmacotherapy We discussed various medication options to help Janice Hutchinson with her weight loss efforts and we both agreed to :  consider AOM if weight loss does not ensue.   ASSOCIATED CONDITIONS ADDRESSED TODAY  Hypertension, essential Assessment & Plan: Blood pressure has improved.  On bisoprolol, amlodipine and losartan and hydrochlorothiazide without adverse effects.    Continue with weight loss therapy.  Monitor for symptoms of orthostasis while losing weight. Continue current regimen and home monitoring for a goal blood pressure of 120/80.      Obesity, current BMI 31  Bilateral leg edema Assessment & Plan: Worsening, Likely secondary to CCB. No signs or symptoms of HF. May benefit  PRN loop diuretic. Will try compression garments.      PHYSICAL EXAM:  Blood pressure 128/62, pulse (!) 55, temperature 98 F (36.7 C), height 5\' 6"  (1.676 m), weight 198 lb (89.8 kg), SpO2 100  %. Body mass index is 31.96 kg/m.  General: She is overweight, cooperative, alert, well developed, and in no acute distress. PSYCH: Has normal mood, affect and thought process.   HEENT: EOMI, sclerae are anicteric. Lungs: Normal breathing effort, no conversational dyspnea. Extremities: No edema.  Neurologic: No gross sensory or motor deficits. No tremors or fasciculations noted.    DIAGNOSTIC DATA REVIEWED:  BMET    Component Value Date/Time   NA 138 10/01/2022 1045   NA 139 05/29/2022 1600   K 4.0 10/01/2022 1045   CL 109 10/01/2022 1045   CO2 26 10/01/2022 1045   GLUCOSE 84 10/01/2022 1045   BUN 33 (H) 10/01/2022 1045   BUN 28 (H) 05/29/2022 1600   CREATININE 1.05 (H) 10/01/2022 1045   CALCIUM 9.3 10/01/2022 1045   GFRNONAA 57 (L) 10/01/2022 1045   GFRAA >60 03/17/2017 1048   Lab Results  Component Value Date   HGBA1C 4.7 (L) 05/29/2022   Lab Results  Component Value Date   INSULIN 15.0 12/01/2022   INSULIN 12.1 05/01/2022   Lab Results  Component Value Date   TSH 0.58 05/04/2014   CBC    Component Value Date/Time   WBC 4.8 10/01/2022 1045   RBC 5.55 (H) 10/01/2022 1045   HGB 14.4 10/01/2022 1045   HGB 15.3 05/29/2022 1600   HCT 46.3 (H) 10/01/2022 1045   HCT 47.4 (H) 05/29/2022 1600   PLT 204 10/01/2022 1045   MCV 83.4 10/01/2022 1045   MCV 80 05/29/2022 1600   MCH 25.9 (L) 10/01/2022 1045   MCHC 31.1 10/01/2022 1045   RDW 14.8 10/01/2022 1045   RDW 14.3 05/29/2022 1600   Iron Studies No results found for: "IRON", "TIBC", "FERRITIN", "IRONPCTSAT" Lipid Panel     Component Value Date/Time   CHOL 181 05/01/2022 1222   TRIG 91 05/01/2022 1222   HDL 52 05/01/2022 1222   CHOLHDL 4 05/04/2014 1005   VLDL 15.0 05/04/2014 1005   LDLCALC 112 (H) 05/01/2022 1222   Hepatic Function Panel     Component Value Date/Time   PROT 7.3 10/01/2022 1045   PROT 7.4 05/29/2022 1600   ALBUMIN 3.7 10/01/2022 1045   ALBUMIN 4.2 05/29/2022 1600   AST 23  10/01/2022 1045   ALT 28 10/01/2022 1045   ALKPHOS 81 10/01/2022 1045   BILITOT 1.2 10/01/2022 1045   BILITOT 0.8 05/29/2022 1600   BILIDIR 0.3 (H) 10/01/2022 1045   IBILI 0.9 10/01/2022 1045      Component Value Date/Time   TSH 0.58 05/04/2014 1005   Nutritional Lab Results  Component Value Date   VD25OH 45.5 12/01/2022   VD25OH 14.1 (L) 05/29/2022   VD25OH 28.71 (L) 05/04/2014     Return in about 4 weeks (around 02/11/2023) for For Weight Mangement with Dr. Rikki Spearing.Marland Kitchen She was informed of the importance of frequent follow up visits to maximize her success with intensive lifestyle modifications for her multiple health conditions.   ATTESTASTION STATEMENTS:  Reviewed by clinician on day of visit: allergies, medications, problem list, medical history, surgical history, family history, social history, and previous encounter notes.     Worthy Rancher, MD

## 2023-02-03 DIAGNOSIS — E785 Hyperlipidemia, unspecified: Secondary | ICD-10-CM | POA: Diagnosis not present

## 2023-02-03 DIAGNOSIS — M545 Low back pain, unspecified: Secondary | ICD-10-CM | POA: Diagnosis not present

## 2023-02-03 DIAGNOSIS — Z79899 Other long term (current) drug therapy: Secondary | ICD-10-CM | POA: Diagnosis not present

## 2023-02-03 DIAGNOSIS — M7989 Other specified soft tissue disorders: Secondary | ICD-10-CM | POA: Diagnosis not present

## 2023-02-03 DIAGNOSIS — I10 Essential (primary) hypertension: Secondary | ICD-10-CM | POA: Diagnosis not present

## 2023-02-03 LAB — BASIC METABOLIC PANEL
BUN: 18 (ref 4–21)
CO2: 29 — AB (ref 13–22)
Chloride: 104 (ref 99–108)
Creatinine: 1 (ref 0.5–1.1)
Glucose: 92
Potassium: 4.6 mEq/L (ref 3.5–5.1)
Sodium: 140 (ref 137–147)

## 2023-02-03 LAB — LIPID PANEL
Cholesterol: 177 (ref 0–200)
HDL: 65 (ref 35–70)
LDL Cholesterol: 94
Triglycerides: 89 (ref 40–160)

## 2023-02-03 LAB — COMPREHENSIVE METABOLIC PANEL
Albumin: 4.1 (ref 3.5–5.0)
Calcium: 10.5 (ref 8.7–10.7)
Globulin: 3.3
eGFR: 62

## 2023-02-03 LAB — HEPATIC FUNCTION PANEL
ALT: 23 U/L (ref 7–35)
AST: 16 (ref 13–35)
Alkaline Phosphatase: 92 (ref 25–125)
Bilirubin, Total: 1.5

## 2023-02-03 LAB — TSH: TSH: 1.05 (ref 0.41–5.90)

## 2023-02-12 ENCOUNTER — Encounter (INDEPENDENT_AMBULATORY_CARE_PROVIDER_SITE_OTHER): Payer: Self-pay | Admitting: Internal Medicine

## 2023-02-12 ENCOUNTER — Other Ambulatory Visit (INDEPENDENT_AMBULATORY_CARE_PROVIDER_SITE_OTHER): Payer: Self-pay

## 2023-02-12 ENCOUNTER — Ambulatory Visit (INDEPENDENT_AMBULATORY_CARE_PROVIDER_SITE_OTHER): Payer: Medicare Other | Admitting: Internal Medicine

## 2023-02-12 ENCOUNTER — Encounter (INDEPENDENT_AMBULATORY_CARE_PROVIDER_SITE_OTHER): Payer: Self-pay

## 2023-02-12 VITALS — BP 126/65 | HR 56 | Temp 98.2°F | Ht 66.0 in | Wt 198.0 lb

## 2023-02-12 DIAGNOSIS — Z6831 Body mass index (BMI) 31.0-31.9, adult: Secondary | ICD-10-CM

## 2023-02-12 DIAGNOSIS — R7303 Prediabetes: Secondary | ICD-10-CM

## 2023-02-12 DIAGNOSIS — I10 Essential (primary) hypertension: Secondary | ICD-10-CM

## 2023-02-12 DIAGNOSIS — R948 Abnormal results of function studies of other organs and systems: Secondary | ICD-10-CM | POA: Insufficient documentation

## 2023-02-12 DIAGNOSIS — R600 Localized edema: Secondary | ICD-10-CM

## 2023-02-12 DIAGNOSIS — E669 Obesity, unspecified: Secondary | ICD-10-CM

## 2023-02-12 NOTE — Assessment & Plan Note (Signed)
Patient has a slower than predicted metabolism. IC 1310 vs. calculated 1559.This may contribute to weight gain, chronic fatigue and difficulty losing weight. We reviewed measures to improve metabolism including progressive strengthening exercises, increasing protein intake at every meal and maintaining adequate hydration and sleep.

## 2023-02-12 NOTE — Assessment & Plan Note (Signed)
Janice Hutchinson has lost a total of 19 pounds or 9% of total body weight.  Her body fat percentage has decreased from 45% to 43% she has now increased muscle mass since she started strengthening exercises with a fitness instructor.  I provided her with an AI generated low-carb high-protein nutrition plan for more variety.  Her blood pressure, glycemic and cholesterol have improved so has her kidney function.  I congratulated her on these non scale accomplishments.

## 2023-02-12 NOTE — Assessment & Plan Note (Addendum)
Most recent A1c is improved at Lab Results  Component Value Date   HGBA1C 4.7 (L) 05/29/2022   She has insulin resistance with a Homa IR score of 2.9.  Her fasting blood glucose this month was in the 90s which is above with consider optimal.  She will continue with weight loss therapy and increase physical activity.  We had discussed the benefits of pharmacoprophylaxis with metformin in the past which she has declined.  Continue with low-carb reduced calorie nutrition plan.

## 2023-02-12 NOTE — Assessment & Plan Note (Signed)
Blood pressure has improved.  On bisoprolol, amlodipine and losartan and hydrochlorothiazide without adverse effects.  I reviewed most recent labs done at her PCPs office which shows a GFR of 62 which is stable and improved.  Continue with weight loss therapy.  Monitor for symptoms of orthostasis while losing weight. Continue current regimen and home monitoring for a goal blood pressure of 120/80.

## 2023-02-12 NOTE — Progress Notes (Signed)
Thank you  Office: [phone]  /  Fax: [phone]  WEIGHT SUMMARY AND BIOMETRICS  Vitals Temp: 98.2 F (36.8 C) BP: 126/65 Pulse Rate: (!) 56 SpO2: 100 %   Anthropometric Measurements Height: 5\' 6"  (1.676 m) Weight: 198 lb (89.8 kg) BMI (Calculated): 31.97 Weight at Last Visit: 198 lb Weight Lost Since Last Visit: 0 Weight Gained Since Last Visit: 0 Starting Weight: 215 lb Total Weight Loss (lbs): 17 lb (7.711 kg) Peak Weight: 217 lb   Body Composition  Body Fat %: 43.1 % Fat Mass (lbs): 43.1 lbs Muscle Mass (lbs): 107.2 lbs Total Body Water (lbs): 73.6 lbs Visceral Fat Rating : 13    No data recorded Today's Visit #: 14  Starting Date: 05/01/22   HPI  Chief Complaint: OBESITY  Janice Hutchinson is here to discuss her progress with her obesity treatment plan. She is on the following a lower carbohydrate, vegetable and lean protein rich diet plan and states she is following her eating plan approximately 95 % of the time. She states she is exercising gym 45 minutes 2 times per week.  Interval History:  Since last office visit she has maintained.  Her BIA does suggest an increase in muscle mass and decrease in fat.  She continues to work with fitness trainer and has good adherence to low-carb high-protein plan.  She has a slow metabolism so has been hard for her to lose weight but has had significant gains in non-scale goals like blood pressure control, glycemic control and improvement in kidney function and quality of life. She reports good adherence to reduced calorie nutritional plan. She has been working on not skipping meals, increasing protein intake at every meal, eating more fruits, eating more vegetables, drinking more water, continues to exercise, mindfulness around eating, and controlling orexigenic cues and stimuli  Orixegenic Control: Denies problems with appetite and hunger signals.  Denies problems with satiety and satiation.  Denies problems with eating  patterns and portion control.  Denies abnormal cravings. Denies feeling deprived or restricted.   Barriers identified: presence of obesogenic drugs and Abnormal metabolism and increased levels of stress .   Pharmacotherapy for weight loss: She is currently taking no anti-obesity medication.    ASSESSMENT AND PLAN  TREATMENT PLAN FOR OBESITY:  Recommended Dietary Goals  Athina is currently in the action stage of change. As such, her goal is to continue weight management plan. She has agreed to: follow a lower carbohydrate, vegetable and lean protein rich diet plan.  I provided her today with a new AI generated 1100-calorie, 5-day high-protein plan  Behavioral Intervention  We discussed the following Behavioral Modification Strategies today: increasing lean protein intake, decreasing simple carbohydrates , increasing vegetables, increasing lower glycemic fruits, increasing fiber rich foods, increasing water intake, work on managing stress, creating time for self-care and relaxation measures, continue to practice mindfulness when eating, planning for success, and reflecting on none scale accomplishments .  Additional resources provided today: Personalized instruction on the use of artificial intelligence for recipes, calorie tracking, and finding healthier options when eating out.   Recommended Physical Activity Goals  [name] has been advised to work up to 150 minutes of moderate intensity aerobic activity a week and strengthening exercises 2-3 times per week for cardiovascular health, weight loss maintenance and preservation of muscle mass.   She has agreed to :  Continue current level of physical activity   Pharmacotherapy We discussed various medication options to help Michiko with her weight loss efforts and we both  agreed to : continue with nutritional and behavioral strategies  ASSOCIATED CONDITIONS ADDRESSED TODAY  Abnormal metabolism Assessment & Plan: Patient has a slower than  predicted metabolism. IC 1310 vs. calculated 1559.This may contribute to weight gain, chronic fatigue and difficulty losing weight. We reviewed measures to improve metabolism including progressive strengthening exercises, increasing protein intake at every meal and maintaining adequate hydration and sleep.     Hypertension, essential Assessment & Plan: Blood pressure has improved.  On bisoprolol, amlodipine and losartan and hydrochlorothiazide without adverse effects.  I reviewed most recent labs done at her PCPs office which shows a GFR of 62 which is stable and improved.  Continue with weight loss therapy.  Monitor for symptoms of orthostasis while losing weight. Continue current regimen and home monitoring for a goal blood pressure of 120/80.      Obesity, current BMI 31 Assessment & Plan: [name] has lost a total of 19 pounds or 9% of total body weight.  Her body fat percentage has decreased from 45% to 43% she has now increased muscle mass since she started strengthening exercises with a fitness instructor.  I provided her with an AI generated low-carb high-protein nutrition plan for more variety.  Her blood pressure, glycemic and cholesterol have improved so has her kidney function.  I congratulated her on these non scale accomplishments.   Bilateral leg edema Assessment & Plan: Improved she is using compression socks.  Her PCP had also prescribed furosemide and potassium to use as needed.  Continue to monitor.   Prediabetes Assessment & Plan: Most recent A1c is improved at Lab Results  Component Value Date   HGBA1C 4.7 (L) 05/29/2022   She has insulin resistance with a Homa IR score of 2.9.  Her fasting blood glucose this month was in the 90s which is above with consider optimal.  She will continue with weight loss therapy and increase physical activity.  We had discussed the benefits of pharmacoprophylaxis with metformin in the past which she has declined.  Continue with  low-carb reduced calorie nutrition plan.      PHYSICAL EXAM:  Blood pressure 126/65, pulse (!) 56, temperature 98.2 F (36.8 C), height 5\' 6"  (1.676 m), weight 198 lb (89.8 kg), SpO2 100%. Body mass index is 31.96 kg/m.  General: She is overweight, cooperative, alert, well developed, and in no acute distress. PSYCH: Has normal mood, affect and thought process.   HEENT: EOMI, sclerae are anicteric. Lungs: Normal breathing effort, no conversational dyspnea. Extremities: No edema.  Neurologic: No gross sensory or motor deficits. No tremors or fasciculations noted.    DIAGNOSTIC DATA REVIEWED:  BMET    Component Value Date/Time   NA 140 02/03/2023 0000   K 4.6 02/03/2023 0000   CL 104 02/03/2023 0000   CO2 29 (A) 02/03/2023 0000   GLUCOSE 84 10/01/2022 1045   BUN 18 02/03/2023 0000   CREATININE 1.0 02/03/2023 0000   CREATININE 1.05 (H) 10/01/2022 1045   CALCIUM 10.5 02/03/2023 0000   GFRNONAA 57 (L) 10/01/2022 1045   GFRAA >60 03/17/2017 1048   Lab Results  Component Value Date   HGBA1C 4.7 (L) 05/29/2022   Lab Results  Component Value Date   INSULIN 15.0 12/01/2022   INSULIN 12.1 05/01/2022   Lab Results  Component Value Date   TSH 1.05 02/03/2023   CBC    Component Value Date/Time   WBC 4.8 10/01/2022 1045   RBC 5.55 (H) 10/01/2022 1045   HGB 14.4 10/01/2022 1045  HGB 15.3 05/29/2022 1600   HCT 46.3 (H) 10/01/2022 1045   HCT 47.4 (H) 05/29/2022 1600   PLT 204 10/01/2022 1045   MCV 83.4 10/01/2022 1045   MCV 80 05/29/2022 1600   MCH 25.9 (L) 10/01/2022 1045   MCHC 31.1 10/01/2022 1045   RDW 14.8 10/01/2022 1045   RDW 14.3 05/29/2022 1600   Iron Studies No results found for: "IRON", "TIBC", "FERRITIN", "IRONPCTSAT" Lipid Panel     Component Value Date/Time   CHOL 177 02/03/2023 0000   CHOL 181 05/01/2022 1222   TRIG 89 02/03/2023 0000   HDL 65 02/03/2023 0000   HDL 52 05/01/2022 1222   CHOLHDL 4 05/04/2014 1005   VLDL 15.0 05/04/2014 1005    LDLCALC 94 02/03/2023 0000   LDLCALC 112 (H) 05/01/2022 1222   Hepatic Function Panel     Component Value Date/Time   PROT 7.3 10/01/2022 1045   PROT 7.4 05/29/2022 1600   ALBUMIN 4.1 02/03/2023 0000   ALBUMIN 4.2 05/29/2022 1600   AST 16 02/03/2023 0000   ALT 23 02/03/2023 0000   ALKPHOS 92 02/03/2023 0000   BILITOT 1.2 10/01/2022 1045   BILITOT 0.8 05/29/2022 1600   BILIDIR 0.3 (H) 10/01/2022 1045   IBILI 0.9 10/01/2022 1045      Component Value Date/Time   TSH 1.05 02/03/2023 0000   TSH 0.58 05/04/2014 1005   Nutritional Lab Results  Component Value Date   VD25OH 45.5 12/01/2022   VD25OH 14.1 (L) 05/29/2022   VD25OH 28.71 (L) 05/04/2014     Return in about 4 weeks (around 03/12/2023) for For Weight Mangement with Dr. Rikki Spearing.Marland Kitchen She was informed of the importance of frequent follow up visits to maximize her success with intensive lifestyle modifications for her multiple health conditions.   ATTESTASTION STATEMENTS:  Reviewed by clinician on day of visit: allergies, medications, problem list, medical history, surgical history, family history, social history, and previous encounter notes.     Worthy Rancher, MD

## 2023-02-12 NOTE — Assessment & Plan Note (Signed)
Improved she is using compression socks.  Her PCP had also prescribed furosemide and potassium to use as needed.  Continue to monitor.

## 2023-03-04 DIAGNOSIS — Z79899 Other long term (current) drug therapy: Secondary | ICD-10-CM | POA: Diagnosis not present

## 2023-03-10 DIAGNOSIS — H18413 Arcus senilis, bilateral: Secondary | ICD-10-CM | POA: Diagnosis not present

## 2023-03-10 DIAGNOSIS — H25013 Cortical age-related cataract, bilateral: Secondary | ICD-10-CM | POA: Diagnosis not present

## 2023-03-10 DIAGNOSIS — H25043 Posterior subcapsular polar age-related cataract, bilateral: Secondary | ICD-10-CM | POA: Diagnosis not present

## 2023-03-10 DIAGNOSIS — H2512 Age-related nuclear cataract, left eye: Secondary | ICD-10-CM | POA: Diagnosis not present

## 2023-03-10 DIAGNOSIS — H2513 Age-related nuclear cataract, bilateral: Secondary | ICD-10-CM | POA: Diagnosis not present

## 2023-03-16 ENCOUNTER — Ambulatory Visit (INDEPENDENT_AMBULATORY_CARE_PROVIDER_SITE_OTHER): Payer: Medicare Other | Admitting: Internal Medicine

## 2023-03-16 ENCOUNTER — Encounter (INDEPENDENT_AMBULATORY_CARE_PROVIDER_SITE_OTHER): Payer: Self-pay | Admitting: Internal Medicine

## 2023-03-16 VITALS — BP 130/84 | HR 59 | Temp 97.9°F | Ht 66.0 in | Wt 195.0 lb

## 2023-03-16 DIAGNOSIS — R944 Abnormal results of kidney function studies: Secondary | ICD-10-CM | POA: Diagnosis not present

## 2023-03-16 DIAGNOSIS — U071 COVID-19: Secondary | ICD-10-CM | POA: Insufficient documentation

## 2023-03-16 DIAGNOSIS — R948 Abnormal results of function studies of other organs and systems: Secondary | ICD-10-CM | POA: Diagnosis not present

## 2023-03-16 DIAGNOSIS — E669 Obesity, unspecified: Secondary | ICD-10-CM | POA: Diagnosis not present

## 2023-03-16 DIAGNOSIS — Z6831 Body mass index (BMI) 31.0-31.9, adult: Secondary | ICD-10-CM

## 2023-03-16 NOTE — Assessment & Plan Note (Signed)
Patient has a slower than predicted metabolism. IC 1310 vs. calculated 1559.This may contribute to weight gain, chronic fatigue and difficulty losing weight. We reviewed measures to improve metabolism including progressive strengthening exercises, increasing protein intake at every meal and maintaining adequate hydration and sleep.    Consider repeating metabolism in October.  This will have to be done in a fasting state.

## 2023-03-16 NOTE — Assessment & Plan Note (Addendum)
Reviewed recent labs on patient's phone.  Her GFR back down to 55 previous values in the 60s.  Has similar problems in the past to hydrochlorothiazide now at a lower dose.  I counseled patient on increasing hydration avoidance of nephrotoxins continue current regimen for now.  She is monitored closely by her PCP.  Blood pressure control has improved.

## 2023-03-16 NOTE — Assessment & Plan Note (Signed)
Improved.  No signs or symptoms of complications.  Did not take antiviral therapy.  Continue to monitor.

## 2023-03-16 NOTE — Progress Notes (Signed)
Office: [phone]  /  Fax: [phone]  WEIGHT SUMMARY AND BIOMETRICS  Vitals Temp: 97.9 F (36.6 C) BP: 130/84 Pulse Rate: (!) 59 SpO2: 98 %   Anthropometric Measurements Height: 5\' 6"  (1.676 m) Weight: 195 lb (88.5 kg) BMI (Calculated): 31.49 Weight at Last Visit: 198 lb Weight Lost Since Last Visit: 3 lb Weight Gained Since Last Visit: 0 lb Starting Weight: 215 lb Total Weight Loss (lbs): 20 lb (9.072 kg) Peak Weight: 217lb   Body Composition  Body Fat %: 43.3 % Fat Mass (lbs): 84.4 lbs Muscle Mass (lbs): 105 lbs Total Body Water (lbs): 71 lbs Visceral Fat Rating : 13    No data recorded Today's Visit #: 15  Starting Date: 05/11/22   HPI  Chief Complaint: OBESITY  Janice Hutchinson is here to discuss her progress with her obesity treatment plan. She is on the following a lower carbohydrate, vegetable and lean protein rich diet plan and states she is following her eating plan approximately 85 % of the time. She states she is exercising 45 minutes 1-3 times per week.  Interval History:  Since last office visit she has lost 3 lbs. Has Covid, tested positive last Wednesday. No fever, chills, no SOB. Still working Paediatric nurse and progressing. Has not been journaling. Has decreased calories due to improved hunger.  She reports good adherence to reduced calorie nutritional plan. She has been working on not skipping meals, increasing protein intake at every meal, eating more fruits, eating more vegetables, drinking more water, and continues to exercise  Orixegenic Control: Denies problems with appetite and hunger signals.  Denies problems with satiety and satiation.  Denies problems with eating patterns and portion control.  Denies abnormal cravings. Denies feeling deprived or restricted.   Barriers identified: none.   Pharmacotherapy for weight loss: She is currently taking no anti-obesity medication.    ASSESSMENT AND PLAN  TREATMENT PLAN FOR  OBESITY:  Recommended Dietary Goals  Janice Hutchinson is currently in the action stage of change. As such, her goal is to continue weight management plan. She has agreed to: continue current plan  Behavioral Intervention  We discussed the following Behavioral Modification Strategies today: increasing lean protein intake, decreasing simple carbohydrates , increasing vegetables, increasing lower glycemic fruits, increasing water intake, continue to practice mindfulness when eating, and planning for success.  Additional resources provided today: None  Recommended Physical Activity Goals  Janice Hutchinson has been advised to work up to 150 minutes of moderate intensity aerobic activity a week and strengthening exercises 2-3 times per week for cardiovascular health, weight loss maintenance and preservation of muscle mass.   She has agreed to :  Continue current level of physical activity   Pharmacotherapy We discussed various medication options to help Janice Hutchinson with her weight loss efforts and we both agreed to : continue with nutritional and behavioral strategies  ASSOCIATED CONDITIONS ADDRESSED TODAY  COVID-19 Assessment & Plan: Improved.  No signs or symptoms of complications.  Did not take antiviral therapy.  Continue to monitor.   Decreased GFR Assessment & Plan: Reviewed recent labs on patient's phone.  Her GFR back down to 55 previous values in the 60s.  Has similar problems in the past to hydrochlorothiazide now at a lower dose.  I counseled patient on increasing hydration avoidance of nephrotoxins continue current regimen for now.  She is monitored closely by her PCP.  Blood pressure control has improved.   Obesity, current BMI 31 Assessment & Plan: Janice Hutchinson has lost a total of 22  pounds or 10% of total body weight.  She is at times dissatisfied with her weight loss rate but has been focusing more on the nonscale accomplishments.  Her body fat percentage has decreased from 45% to 43% she has now  increased muscle mass since she started strengthening exercises with a fitness instructor.    Her blood pressure, glycemic and cholesterol have improved.    Abnormal metabolism Assessment & Plan: Patient has a slower than predicted metabolism. IC 1310 vs. calculated 1559.This may contribute to weight gain, chronic fatigue and difficulty losing weight. We reviewed measures to improve metabolism including progressive strengthening exercises, increasing protein intake at every meal and maintaining adequate hydration and sleep.    Consider repeating metabolism in October.  This will have to be done in a fasting state.     PHYSICAL EXAM:  Blood pressure 130/84, pulse (!) 59, temperature 97.9 F (36.6 C), height 5\' 6"  (1.676 m), weight 195 lb (88.5 kg), SpO2 98%. Body mass index is 31.47 kg/m.  General: She is overweight, cooperative, alert, well developed, and in no acute distress. PSYCH: Has normal mood, affect and thought process.   HEENT: EOMI, sclerae are anicteric. Lungs: Normal breathing effort, no conversational dyspnea. Extremities: No edema.  Neurologic: No gross sensory or motor deficits. No tremors or fasciculations noted.    DIAGNOSTIC DATA REVIEWED:  BMET    Component Value Date/Time   NA 140 02/03/2023 0000   K 4.6 02/03/2023 0000   CL 104 02/03/2023 0000   CO2 29 (A) 02/03/2023 0000   GLUCOSE 84 10/01/2022 1045   BUN 18 02/03/2023 0000   CREATININE 1.0 02/03/2023 0000   CREATININE 1.05 (H) 10/01/2022 1045   CALCIUM 10.5 02/03/2023 0000   GFRNONAA 57 (L) 10/01/2022 1045   GFRAA >60 03/17/2017 1048   Lab Results  Component Value Date   HGBA1C 4.7 (L) 05/29/2022   Lab Results  Component Value Date   INSULIN 15.0 12/01/2022   INSULIN 12.1 05/01/2022   Lab Results  Component Value Date   TSH 1.05 02/03/2023   CBC    Component Value Date/Time   WBC 4.8 10/01/2022 1045   RBC 5.55 (H) 10/01/2022 1045   HGB 14.4 10/01/2022 1045   HGB 15.3 05/29/2022 1600    HCT 46.3 (H) 10/01/2022 1045   HCT 47.4 (H) 05/29/2022 1600   PLT 204 10/01/2022 1045   MCV 83.4 10/01/2022 1045   MCV 80 05/29/2022 1600   MCH 25.9 (L) 10/01/2022 1045   MCHC 31.1 10/01/2022 1045   RDW 14.8 10/01/2022 1045   RDW 14.3 05/29/2022 1600   Iron Studies No results found for: "IRON", "TIBC", "FERRITIN", "IRONPCTSAT" Lipid Panel     Component Value Date/Time   CHOL 177 02/03/2023 0000   CHOL 181 05/01/2022 1222   TRIG 89 02/03/2023 0000   HDL 65 02/03/2023 0000   HDL 52 05/01/2022 1222   CHOLHDL 4 05/04/2014 1005   VLDL 15.0 05/04/2014 1005   LDLCALC 94 02/03/2023 0000   LDLCALC 112 (H) 05/01/2022 1222   Hepatic Function Panel     Component Value Date/Time   PROT 7.3 10/01/2022 1045   PROT 7.4 05/29/2022 1600   ALBUMIN 4.1 02/03/2023 0000   ALBUMIN 4.2 05/29/2022 1600   AST 16 02/03/2023 0000   ALT 23 02/03/2023 0000   ALKPHOS 92 02/03/2023 0000   BILITOT 1.2 10/01/2022 1045   BILITOT 0.8 05/29/2022 1600   BILIDIR 0.3 (H) 10/01/2022 1045   IBILI 0.9 10/01/2022 1045  Component Value Date/Time   TSH 1.05 02/03/2023 0000   TSH 0.58 05/04/2014 1005   Nutritional Lab Results  Component Value Date   VD25OH 45.5 12/01/2022   VD25OH 14.1 (L) 05/29/2022   VD25OH 28.71 (L) 05/04/2014     Return in about 6 weeks (around 04/27/2023) for For Weight Mangement with Dr. Rikki Spearing week of Sep 30th.. She was informed of the importance of frequent follow up visits to maximize her success with intensive lifestyle modifications for her multiple health conditions.   ATTESTASTION STATEMENTS:  Reviewed by clinician on day of visit: allergies, medications, problem list, medical history, surgical history, family history, social history, and previous encounter notes.     Worthy Rancher, MD

## 2023-03-16 NOTE — Assessment & Plan Note (Signed)
Buffey has lost a total of 22 pounds or 10% of total body weight.  She is at times dissatisfied with her weight loss rate but has been focusing more on the nonscale accomplishments.  Her body fat percentage has decreased from 45% to 43% she has now increased muscle mass since she started strengthening exercises with a fitness instructor.    Her blood pressure, glycemic and cholesterol have improved.

## 2023-04-16 DIAGNOSIS — Z79899 Other long term (current) drug therapy: Secondary | ICD-10-CM | POA: Diagnosis not present

## 2023-04-27 ENCOUNTER — Encounter (INDEPENDENT_AMBULATORY_CARE_PROVIDER_SITE_OTHER): Payer: Self-pay | Admitting: Internal Medicine

## 2023-04-27 ENCOUNTER — Ambulatory Visit (INDEPENDENT_AMBULATORY_CARE_PROVIDER_SITE_OTHER): Payer: Medicare Other | Admitting: Internal Medicine

## 2023-04-27 VITALS — BP 125/71 | HR 54 | Temp 98.1°F | Ht 66.0 in | Wt 196.0 lb

## 2023-04-27 DIAGNOSIS — Z6831 Body mass index (BMI) 31.0-31.9, adult: Secondary | ICD-10-CM

## 2023-04-27 DIAGNOSIS — R7303 Prediabetes: Secondary | ICD-10-CM | POA: Diagnosis not present

## 2023-04-27 DIAGNOSIS — I10 Essential (primary) hypertension: Secondary | ICD-10-CM

## 2023-04-27 DIAGNOSIS — R948 Abnormal results of function studies of other organs and systems: Secondary | ICD-10-CM

## 2023-04-27 DIAGNOSIS — R944 Abnormal results of kidney function studies: Secondary | ICD-10-CM

## 2023-04-27 DIAGNOSIS — E669 Obesity, unspecified: Secondary | ICD-10-CM

## 2023-04-27 DIAGNOSIS — Z6835 Body mass index (BMI) 35.0-35.9, adult: Secondary | ICD-10-CM

## 2023-04-27 NOTE — Progress Notes (Signed)
Office: [phone]  /  Fax: [phone]  WEIGHT SUMMARY AND BIOMETRICS  Vitals Temp: 98.1 F (36.7 C) BP: 125/71 Pulse Rate: (!) 54 SpO2: 100 %   Anthropometric Measurements Height: 5\' 6"  (1.676 m) Weight: 196 lb (88.9 kg) BMI (Calculated): 31.65 Weight at Last Visit: 195 lb Weight Lost Since Last Visit: 0 Weight Gained Since Last Visit: 1 lb Starting Weight: 215 lb Total Weight Loss (lbs): 19 lb (8.618 kg) Peak Weight: 217 lb   Body Composition  Body Fat %: 43.9 % Fat Mass (lbs): 86.2 lbs Muscle Mass (lbs): 104.6 lbs Total Body Water (lbs): 73 lbs Visceral Fat Rating : 13    No data recorded Today's Visit #: 16  Starting Date: 05/11/22   HPI  Chief Complaint: OBESITY  Janice Hutchinson is here to discuss her progress with her obesity treatment plan. She is on the following a lower carbohydrate, vegetable and lean protein rich diet plan and states she is following her eating plan approximately 95 % of the time. She states she is exercising going to the gym 3 times weekly, works with a Systems analyst for 30-45 minutes.  History of Present Illness The patient, with a history of obesity, hypertension, and prediabetes, presents for a follow-up visit. She has been experiencing a plateau in her weight loss journey, with a recent gain of one pound since the last office visit. Despite this, she reports an overall improvement in her health and energy levels, attributing this to regular gym sessions three times a week with a personal trainer.  The patient has been adhering to a low-calorie diet, but has been skipping meals due to increased stress levels and a busy work schedule. She has also been experiencing muscle cramps, which she suspects may be due to her current medication, hydrochlorothiazide.  Despite the weight loss plateau and muscle cramps, the patient reports feeling fit and healthy, with noticeable muscle development and an improved body image. She expresses  frustration at the weight loss plateau and is seeking ways to overcome this.  The patient also mentions upcoming cataract surgery, which may impact her work schedule and stress levels. She has been managing her hypertension with hydrochlorothiazide, but recent lab results show a decrease in GFR, suggesting a possible need for medication adjustment.  In summary, the patient presents with a weight loss plateau despite regular exercise and a low-calorie diet, possible medication-related muscle cramps, and upcoming cataract surgery. She is seeking strategies to overcome the weight loss plateau and manage her overall health.  Results LABS A1c: 4.1% GFR: 48 mL/min/1.73 m   ASSESSMENT AND PLAN  Assessment & Plan  Obesity / abnormal metabolism. She has hit a plateau in weight loss despite maintaining regular exercise and dietary changes.  She has a slow metabolism.  We discussed the role of metabolism in weight loss and the potential benefits of anti-obesity medications, alongside strategies to overcome the plateau such as meal replacements, time-restricted eating, and further calorie reduction. We will use a fabric tape measure to track body measurements and changes in body composition, continue with the exercise and dietary changes, and consider reducing calorie intake to around 1100 calories per day. Meal replacements for one or two meals a day will be considered. We will research anti-obesity medication, specifically tirzepatide.  A follow-up in one month is scheduled to assess progress.  Hypertension Her hypertension is well-controlled on hydrochlorothiazide, but recent labs indicate a decrease in GFR, suggesting potential kidney function impairment.  Her kidney function had  improved after discontinuation of hydrochlorothiazide but he has decreased as medication was restarted at a low dose.  We discussed the possibility of the medication contributing also to muscle cramps. She will discuss this  with PCP and consider alternative blood pressure medications to mitigate potential kidney function impairment and side effects, and continue to monitor blood pressure and kidney function.    Prediabetes There is an improvement in her A1C, indicating good control of prediabetes. We will continue with the current management strategies.   Decreased GFR Reviewed labs from PCP, her GFR is now 48 down from 55 down from 60 previously.  This is likely secondary to hydrochlorothiazide as her kidney function had improved when medication was discontinued.  She does require medication for blood pressure control.  She will review other treatment options with her PCP.  Patient advised to avoid nephrotoxins and maintain adequate hydration    PHYSICAL EXAM:  Blood pressure 125/71, pulse (!) 54, temperature 98.1 F (36.7 C), height 5\' 6"  (1.676 m), weight 196 lb (88.9 kg), SpO2 100%. Body mass index is 31.64 kg/m.  General: She is overweight, cooperative, alert, well developed, and in no acute distress. PSYCH: Has normal mood, affect and thought process.   HEENT: EOMI, sclerae are anicteric. Lungs: Normal breathing effort, no conversational dyspnea. Extremities: No edema.  Neurologic: No gross sensory or motor deficits. No tremors or fasciculations noted.    DIAGNOSTIC DATA REVIEWED:  BMET    Component Value Date/Time   NA 140 02/03/2023 0000   K 4.6 02/03/2023 0000   CL 104 02/03/2023 0000   CO2 29 (A) 02/03/2023 0000   GLUCOSE 84 10/01/2022 1045   BUN 18 02/03/2023 0000   CREATININE 1.0 02/03/2023 0000   CREATININE 1.05 (H) 10/01/2022 1045   CALCIUM 10.5 02/03/2023 0000   GFRNONAA 57 (L) 10/01/2022 1045   GFRAA >60 03/17/2017 1048   Lab Results  Component Value Date   HGBA1C 4.7 (L) 05/29/2022   Lab Results  Component Value Date   INSULIN 15.0 12/01/2022   INSULIN 12.1 05/01/2022   Lab Results  Component Value Date   TSH 1.05 02/03/2023   CBC    Component Value Date/Time    WBC 4.8 10/01/2022 1045   RBC 5.55 (H) 10/01/2022 1045   HGB 14.4 10/01/2022 1045   HGB 15.3 05/29/2022 1600   HCT 46.3 (H) 10/01/2022 1045   HCT 47.4 (H) 05/29/2022 1600   PLT 204 10/01/2022 1045   MCV 83.4 10/01/2022 1045   MCV 80 05/29/2022 1600   MCH 25.9 (L) 10/01/2022 1045   MCHC 31.1 10/01/2022 1045   RDW 14.8 10/01/2022 1045   RDW 14.3 05/29/2022 1600   Iron Studies No results found for: "IRON", "TIBC", "FERRITIN", "IRONPCTSAT" Lipid Panel     Component Value Date/Time   CHOL 177 02/03/2023 0000   CHOL 181 05/01/2022 1222   TRIG 89 02/03/2023 0000   HDL 65 02/03/2023 0000   HDL 52 05/01/2022 1222   CHOLHDL 4 05/04/2014 1005   VLDL 15.0 05/04/2014 1005   LDLCALC 94 02/03/2023 0000   LDLCALC 112 (H) 05/01/2022 1222   Hepatic Function Panel     Component Value Date/Time   PROT 7.3 10/01/2022 1045   PROT 7.4 05/29/2022 1600   ALBUMIN 4.1 02/03/2023 0000   ALBUMIN 4.2 05/29/2022 1600   AST 16 02/03/2023 0000   ALT 23 02/03/2023 0000   ALKPHOS 92 02/03/2023 0000   BILITOT 1.2 10/01/2022 1045   BILITOT 0.8 05/29/2022 1600  BILIDIR 0.3 (H) 10/01/2022 1045   IBILI 0.9 10/01/2022 1045      Component Value Date/Time   TSH 1.05 02/03/2023 0000   TSH 0.58 05/04/2014 1005   Nutritional Lab Results  Component Value Date   VD25OH 45.5 12/01/2022   VD25OH 14.1 (L) 05/29/2022   VD25OH 28.71 (L) 05/04/2014     Return in about 4 weeks (around 05/25/2023) for For Weight Mangement with Dr. Rikki Spearing.Marland Kitchen She was informed of the importance of frequent follow up visits to maximize her success with intensive lifestyle modifications for her multiple health conditions.   ATTESTASTION STATEMENTS:  Reviewed by clinician on day of visit: allergies, medications, problem list, medical history, surgical history, family history, social history, and previous encounter notes.     Worthy Rancher, MD

## 2023-05-06 DIAGNOSIS — H2512 Age-related nuclear cataract, left eye: Secondary | ICD-10-CM | POA: Diagnosis not present

## 2023-05-07 DIAGNOSIS — H2511 Age-related nuclear cataract, right eye: Secondary | ICD-10-CM | POA: Diagnosis not present

## 2023-05-20 DIAGNOSIS — H2511 Age-related nuclear cataract, right eye: Secondary | ICD-10-CM | POA: Diagnosis not present

## 2023-05-25 ENCOUNTER — Ambulatory Visit (INDEPENDENT_AMBULATORY_CARE_PROVIDER_SITE_OTHER): Payer: Medicare Other | Admitting: Internal Medicine

## 2023-05-25 ENCOUNTER — Encounter (INDEPENDENT_AMBULATORY_CARE_PROVIDER_SITE_OTHER): Payer: Self-pay | Admitting: Internal Medicine

## 2023-05-25 VITALS — BP 134/82 | HR 57 | Temp 99.1°F | Ht 66.0 in | Wt 197.0 lb

## 2023-05-25 DIAGNOSIS — E66812 Obesity, class 2: Secondary | ICD-10-CM

## 2023-05-25 DIAGNOSIS — Z6835 Body mass index (BMI) 35.0-35.9, adult: Secondary | ICD-10-CM

## 2023-05-25 DIAGNOSIS — R948 Abnormal results of function studies of other organs and systems: Secondary | ICD-10-CM

## 2023-05-25 DIAGNOSIS — R7303 Prediabetes: Secondary | ICD-10-CM

## 2023-05-25 DIAGNOSIS — I10 Essential (primary) hypertension: Secondary | ICD-10-CM | POA: Diagnosis not present

## 2023-05-25 NOTE — Assessment & Plan Note (Signed)
Blood pressure at goal for age and risk category.  On amlodipine, bisoprolol and losartan without adverse effects.    Continue with weight loss therapy. Monitor for symptoms of orthostasis while losing weight. Continue current regimen

## 2023-05-25 NOTE — Progress Notes (Signed)
Office: [phone]  /  Fax: [phone]  WEIGHT SUMMARY AND BIOMETRICS  Vitals Temp: 99.1 F (37.3 C) BP: 134/82 Pulse Rate: (!) 57 SpO2: 100 %   Anthropometric Measurements Height: 5\' 6"  (1.676 m) Weight: 197 lb (89.4 kg) BMI (Calculated): 31.81 Weight at Last Visit: 196 lb Weight Lost Since Last Visit: 0 lb Weight Gained Since Last Visit: 1 lb Starting Weight: 215 lb Total Weight Loss (lbs): 18 lb (8.165 kg) Peak Weight: 217 lb   Body Composition  Body Fat %: 44.3 % Fat Mass (lbs): 87.2 lbs Muscle Mass (lbs): 104.2 lbs Total Body Water (lbs): 72.8 lbs Visceral Fat Rating : 13    No data recorded Today's Visit #: 17  Starting Date: 05/11/22   HPI  Chief Complaint: OBESITY  Janice Hutchinson is here to discuss her progress with her obesity treatment plan. She is on the following a lower carbohydrate, vegetable and lean protein rich diet plan and states she is following her eating plan approximately 90 % of the time. She states she is walking 30 minutes 2 times per week.  Interval History:  Since last office visit she has gained 1 pounds.  She recently had cataract surgery and had been advised to reduce physical activity levels.  She also has noticed some loosening in her nutrition.  Her husband also had shoulder surgery and so she has been caring for him this at times affects her sleep. She reports good adherence to reduced calorie nutritional plan. She has been working on reading food labels, not skipping meals, increasing protein intake at every meal, drinking more water, making healthier choices, reducing portion sizes, and incorporating more whole foods  Orexigenic Control: Denies problems with appetite and hunger signals.  Denies problems with satiety and satiation.  Denies problems with eating patterns and portion control.  Denies abnormal cravings. Denies feeling deprived or restricted.   Barriers identified: none.   Pharmacotherapy for weight loss: She is  currently taking no anti-obesity medication.    ASSESSMENT AND PLAN  TREATMENT PLAN FOR OBESITY:  Recommended Dietary Goals  Jeriesha is currently in the action stage of change. As such, her goal is to continue weight management plan. She has agreed to: continue current plan  Behavioral Intervention  We discussed the following Behavioral Modification Strategies today: continue to work on maintaining a reduced calorie state, getting the recommended amount of protein, incorporating whole foods, making healthy choices, staying well hydrated and practicing mindfulness when eating..  Additional resources provided today: None  Recommended Physical Activity Goals  Cilia has been advised to work up to 150 minutes of moderate intensity aerobic activity a week and strengthening exercises 2-3 times per week for cardiovascular health, weight loss maintenance and preservation of muscle mass.   She has agreed to :  Think about enjoyable ways to increase daily physical activity and overcoming barriers to exercise and Increase physical activity in their day and reduce sedentary time (increase NEAT).  Pharmacotherapy We discussed various medication options to help Rosaisela with her weight loss efforts and we both agreed to : continue with nutritional and behavioral strategies.  We discussed the role of GLP-1 therapy today.  She would be a good candidate as she has several weight sensitive medical comorbidities including history of prediabetes, hypertension, possible kidney disease, which increases her risk of cardiovascular disease.  ASSOCIATED CONDITIONS ADDRESSED TODAY  Class 2 obesity without serious comorbidity with body mass index (BMI) of 35.0 to 35.9 in adult, unspecified obesity type Assessment & Plan:  See obesity treatment plan   Essential hypertension Assessment & Plan: Blood pressure at goal for age and risk category.  On amlodipine, bisoprolol and losartan without adverse effects.     Continue with weight loss therapy. Monitor for symptoms of orthostasis while losing weight. Continue current regimen     Prediabetes Assessment & Plan: Most recent A1c is improved at Lab Results  Component Value Date   HGBA1C 4.7 (L) 05/29/2022   She will continue with low-carb high-protein meal plan.      Abnormal metabolism Assessment & Plan: Patient has a slower than predicted metabolism. IC 1310 vs. calculated 1559.This may contribute to weight gain, chronic fatigue and difficulty losing weight. We reviewed measures to improve metabolism including progressive strengthening exercises, increasing protein intake at every meal and maintaining adequate hydration and sleep.    Consider repeating metabolism in November this will have to be done in a fasting state.     PHYSICAL EXAM:  Blood pressure 134/82, pulse (!) 57, temperature 99.1 F (37.3 C), height 5\' 6"  (1.676 m), weight 197 lb (89.4 kg), SpO2 100%. Body mass index is 31.8 kg/m.  General: She is overweight, cooperative, alert, well developed, and in no acute distress. PSYCH: Has normal mood, affect and thought process.   HEENT: EOMI, sclerae are anicteric. Lungs: Normal breathing effort, no conversational dyspnea. Extremities: No edema.  Neurologic: No gross sensory or motor deficits. No tremors or fasciculations noted.    DIAGNOSTIC DATA REVIEWED:  BMET    Component Value Date/Time   NA 140 02/03/2023 0000   K 4.6 02/03/2023 0000   CL 104 02/03/2023 0000   CO2 29 (A) 02/03/2023 0000   GLUCOSE 84 10/01/2022 1045   BUN 18 02/03/2023 0000   CREATININE 1.0 02/03/2023 0000   CREATININE 1.05 (H) 10/01/2022 1045   CALCIUM 10.5 02/03/2023 0000   GFRNONAA 57 (L) 10/01/2022 1045   GFRAA >60 03/17/2017 1048   Lab Results  Component Value Date   HGBA1C 4.7 (L) 05/29/2022   Lab Results  Component Value Date   INSULIN 15.0 12/01/2022   INSULIN 12.1 05/01/2022   Lab Results  Component Value Date   TSH  1.05 02/03/2023   CBC    Component Value Date/Time   WBC 4.8 10/01/2022 1045   RBC 5.55 (H) 10/01/2022 1045   HGB 14.4 10/01/2022 1045   HGB 15.3 05/29/2022 1600   HCT 46.3 (H) 10/01/2022 1045   HCT 47.4 (H) 05/29/2022 1600   PLT 204 10/01/2022 1045   MCV 83.4 10/01/2022 1045   MCV 80 05/29/2022 1600   MCH 25.9 (L) 10/01/2022 1045   MCHC 31.1 10/01/2022 1045   RDW 14.8 10/01/2022 1045   RDW 14.3 05/29/2022 1600   Iron Studies No results found for: "IRON", "TIBC", "FERRITIN", "IRONPCTSAT" Lipid Panel     Component Value Date/Time   CHOL 177 02/03/2023 0000   CHOL 181 05/01/2022 1222   TRIG 89 02/03/2023 0000   HDL 65 02/03/2023 0000   HDL 52 05/01/2022 1222   CHOLHDL 4 05/04/2014 1005   VLDL 15.0 05/04/2014 1005   LDLCALC 94 02/03/2023 0000   LDLCALC 112 (H) 05/01/2022 1222   Hepatic Function Panel     Component Value Date/Time   PROT 7.3 10/01/2022 1045   PROT 7.4 05/29/2022 1600   ALBUMIN 4.1 02/03/2023 0000   ALBUMIN 4.2 05/29/2022 1600   AST 16 02/03/2023 0000   ALT 23 02/03/2023 0000   ALKPHOS 92 02/03/2023 0000   BILITOT 1.2  10/01/2022 1045   BILITOT 0.8 05/29/2022 1600   BILIDIR 0.3 (H) 10/01/2022 1045   IBILI 0.9 10/01/2022 1045      Component Value Date/Time   TSH 1.05 02/03/2023 0000   TSH 0.58 05/04/2014 1005   Nutritional Lab Results  Component Value Date   VD25OH 45.5 12/01/2022   VD25OH 14.1 (L) 05/29/2022   VD25OH 28.71 (L) 05/04/2014     Return in about 4 weeks (around 06/22/2023) for For Weight Mangement with Dr. Rikki Spearing.Marland Kitchen She was informed of the importance of frequent follow up visits to maximize her success with intensive lifestyle modifications for her multiple health conditions.   ATTESTASTION STATEMENTS:  Reviewed by clinician on day of visit: allergies, medications, problem list, medical history, surgical history, family history, social history, and previous encounter notes.   I have spent 30 minutes in the care of the  patient today including: preparing to see patient (e.g. review and interpretation of tests, old notes ), obtaining and/or reviewing separately obtained history, performing a medically appropriate examination or evaluation, counseling and educating the patient, documenting clinical information in the electronic or other health care record, and independently interpreting results and communicating results to the patient, family, or caregiver   Worthy Rancher, MD

## 2023-05-25 NOTE — Assessment & Plan Note (Signed)
 See obesity treatment plan

## 2023-05-25 NOTE — Assessment & Plan Note (Signed)
Most recent A1c is improved at Lab Results  Component Value Date   HGBA1C 4.7 (L) 05/29/2022   She will continue with low-carb high-protein meal plan.

## 2023-05-25 NOTE — Assessment & Plan Note (Signed)
Patient has a slower than predicted metabolism. IC 1310 vs. calculated 1559.This may contribute to weight gain, chronic fatigue and difficulty losing weight. We reviewed measures to improve metabolism including progressive strengthening exercises, increasing protein intake at every meal and maintaining adequate hydration and sleep.    Consider repeating metabolism in November this will have to be done in a fasting state.

## 2023-06-02 DIAGNOSIS — Z79899 Other long term (current) drug therapy: Secondary | ICD-10-CM | POA: Diagnosis not present

## 2023-06-02 DIAGNOSIS — E785 Hyperlipidemia, unspecified: Secondary | ICD-10-CM | POA: Diagnosis not present

## 2023-06-02 DIAGNOSIS — I10 Essential (primary) hypertension: Secondary | ICD-10-CM | POA: Diagnosis not present

## 2023-06-22 ENCOUNTER — Encounter (INDEPENDENT_AMBULATORY_CARE_PROVIDER_SITE_OTHER): Payer: Self-pay | Admitting: Internal Medicine

## 2023-06-22 ENCOUNTER — Ambulatory Visit (INDEPENDENT_AMBULATORY_CARE_PROVIDER_SITE_OTHER): Payer: Medicare Other | Admitting: Internal Medicine

## 2023-06-22 VITALS — BP 138/86 | HR 56 | Temp 98.1°F | Ht 66.0 in | Wt 199.0 lb

## 2023-06-22 DIAGNOSIS — R7303 Prediabetes: Secondary | ICD-10-CM

## 2023-06-22 DIAGNOSIS — Z6832 Body mass index (BMI) 32.0-32.9, adult: Secondary | ICD-10-CM

## 2023-06-22 DIAGNOSIS — E669 Obesity, unspecified: Secondary | ICD-10-CM | POA: Diagnosis not present

## 2023-06-22 DIAGNOSIS — I10 Essential (primary) hypertension: Secondary | ICD-10-CM

## 2023-06-22 DIAGNOSIS — R948 Abnormal results of function studies of other organs and systems: Secondary | ICD-10-CM

## 2023-06-22 DIAGNOSIS — E66812 Obesity, class 2: Secondary | ICD-10-CM

## 2023-06-22 MED ORDER — TIRZEPATIDE 2.5 MG/0.5ML ~~LOC~~ SOAJ: 2.5000 mg | SUBCUTANEOUS | 0 refills | Status: DC

## 2023-06-22 NOTE — Progress Notes (Signed)
Office: [phone]  /  Fax: [phone]  Weight Summary And Biometrics  Vitals Temp: 98.1 F (36.7 C) BP: 138/86 Pulse Rate: (!) 56 SpO2: 99 %   Anthropometric Measurements Height: 5\' 6"  (1.676 m) Weight: 199 lb (90.3 kg) BMI (Calculated): 32.13 Weight at Last Visit: 197 lb Weight Lost Since Last Visit: 0 lb Weight Gained Since Last Visit: 2 lb Starting Weight: 215 lb Total Weight Loss (lbs): 16 lb (7.258 kg) Peak Weight: 217 lb   Body Composition  Body Fat %: 43.2 % Fat Mass (lbs): 86.2 lbs Muscle Mass (lbs): 107.6 lbs Total Body Water (lbs): 76.2 lbs Visceral Fat Rating : 13    No data recorded Today's Visit #: 18  Starting Date: 05/11/22   Subjective   Chief Complaint: Obesity  Dorrie is here to discuss her progress with her obesity treatment plan. She is on the following a lower carbohydrate, vegetable and lean protein rich diet plan and states she is following her eating plan approximately 95 % of the time. She states she is exercising 30 minutes 3 times per week.  Interval History:   Discussed the use of AI scribe software for clinical note transcription with the patient, who gave verbal consent to proceed.  History of Present Illness   The patient, with a history of obesity, hypertension, abnormal metabolism, and prediabetes, presents for a follow-up visit. Since the last visit, the patient has gained two pounds, attributing this to changes in lifestyle due to caring for a post-operative spouse and undergoing cataract surgery. The patient reports a slight alteration in nutritional habits due to these circumstances.  The patient's cataract surgery has resulted in increased eye sensitivity and dryness, which she attributes to the post-operative medications, including antibiotic and anti-inflammatory drops, and prednisone. The last dosage of these medications was taken the previous week.  The patient has been considering the use of weight loss  medications but expresses apprehension about potential side effects and her impact on blood pressure. She has previously tried metformin for prediabetes, but it was discontinued when her A1c levels increased. The patient is also concerned about the cost of the medication if not covered by insurance.  The patient has been making lifestyle changes to manage her weight, including regular exercise and a balanced diet. However, she expresses frustration at recent weight gain despite these efforts. The patient acknowledges the struggle with weight loss and the potential benefits of medication in managing her obesity.    Orexigenic Control:  Reports problems with appetite and hunger signals.  Reports problems with satiety and satiation.  Denies problems with eating patterns and portion control.  Denies abnormal cravings. Denies feeling deprived or restricted.   Barriers identified: slow metabolism for age and menopause.   Pharmacotherapy for weight loss: She is currently taking no anti-obesity medication.   Assessment and Plan   Treatment Plan For Obesity:  Recommended Dietary Goals  Kwanza is currently in the action stage of change. As such, her goal is to continue weight management plan. She has agreed to: continue current plan  Behavioral Intervention  We discussed the following Behavioral Modification Strategies today: continue to work on maintaining a reduced calorie state, getting the recommended amount of protein, incorporating whole foods, making healthy choices, staying well hydrated and practicing mindfulness when eating..  Additional resources provided today: None  Recommended Physical Activity Goals  Livvie has been advised to work up to 150 minutes of moderate intensity aerobic activity a week and strengthening exercises 2-3 times per  week for cardiovascular health, weight loss maintenance and preservation of muscle mass.   She has agreed to :  Continue current level of  physical activity  and continue to gradually increase the amount and intensity of exercise   Pharmacotherapy  We discussed various medication options to help Mackenzy with her weight loss efforts and we both agreed to : start anti-obesity medication.  In addition to reduced calorie nutrition plan (RCNP), behavioral strategies and physical activity, Selicia would benefit from pharmacotherapy to assist with hunger signals, satiety and cravings. This will reduce obesity-related health risks by inducing weight loss, and help reduce food consumption and adherence to [facility]) . It may also improve QOL by improving self-confidence and reduce the  setbacks associated with metabolic adaptations.  She is also has prediabetes, hypertension and possibly chronic kidney disease due to fluctuations in GFR.  After discussion of treatment options, mechanisms of action, benefits, side effects, contraindications and shared decision making she is agreeable to starting Mounjaro 2.5 mg once a week. Patient also made aware that medication is indicated for long-term management of obesity and the risk of weight regain following discontinuation of treatment and hence the importance of adhering to medical weight loss plan.  We demonstrated use of device and patient using teach back method was able to demonstrate proper technique.  Associated Conditions Addressed Today  Assessment and Plan    Obesity Suella's follow-up on obesity shows a two-pound gain, she has lost 19 pounds with lifestyle changes.  She attributes to a change in routine while caring for her husband and undergoing cataract surgery. We discussed the benefits of Mounjaro for prevention of diabetes, weight loss, including its role in blood pressure control, reducing cardiovascular risk by 33%, and aiding in satiety. We emphasized that obesity is a relapsing remitting disease, not a matter of willpower. We will start Mounjaro at a low dose and titrate up as needed, schedule  a follow-up in three weeks to assess response and adjust dosage, and monitor for side effects, which should be reported via the portal. Jamielynn is encouraged to continue her current nutrition and exercise regimen, maintain balanced nutrition and hydration, and understand the importance of these measures while on the medication.  Hypertension Blood pressure is above target today.  Malli's hypertension management includes regular blood pressure monitoring and continuation of current antihypertensive medications.  She has an appointment with her PCP in the near future.    Prediabetes Her prediabetes had improved, she had been on metformin in the past prescriber her PCP was discontinued because of increases in A1c.  Feel that patient may benefit from GLP-1 therapy for pharmacoprophylaxis.  She also has hyperinsulinemia which increases her cardiovascular risk as well as risk for malignancy.   Abnormal Metabolism Patient has a slower than predicted metabolism. IC 1310 vs. calculated 1559.This may contribute to weight gain, chronic fatigue and difficulty losing weight. We reviewed measures to improve metabolism including progressive strengthening exercises, increasing protein intake at every meal and maintaining adequate hydration and sleep.    Consider repeating metabolism in December this will have to be done in a fasting state.  General Health Maintenance We advised Satvika on the importance of maintaining a balanced diet, regular physical activity, and staying hydrated. Rebbeca should avoid high-fat and high-sugar foods to prevent gastrointestinal discomfort while on Mounjaro and limit alcohol intake to avoid stomach upset.  Follow-up A follow-up appointment is scheduled in three weeks. Kaisley is advised to contact us via the portal for any side effects  or concerns.            Objective   Physical Exam:  Blood pressure 138/86, pulse (!) 56, temperature 98.1 F (36.7 C), height 5\' 6"  (1.676 m),  weight 199 lb (90.3 kg), SpO2 99%. Body mass index is 32.12 kg/m.  General: She is overweight, cooperative, alert, well developed, and in no acute distress. PSYCH: Has normal mood, affect and thought process.   HEENT: EOMI, sclerae are anicteric. Lungs: Normal breathing effort, no conversational dyspnea. Extremities: No edema.  Neurologic: No gross sensory or motor deficits. No tremors or fasciculations noted.    Diagnostic Data Reviewed:  BMET    Component Value Date/Time   NA 140 02/03/2023 0000   K 4.6 02/03/2023 0000   CL 104 02/03/2023 0000   CO2 29 (A) 02/03/2023 0000   GLUCOSE 84 10/01/2022 1045   BUN 18 02/03/2023 0000   CREATININE 1.0 02/03/2023 0000   CREATININE 1.05 (H) 10/01/2022 1045   CALCIUM 10.5 02/03/2023 0000   GFRNONAA 57 (L) 10/01/2022 1045   GFRAA >60 03/17/2017 1048   Lab Results  Component Value Date   HGBA1C 4.7 (L) 05/29/2022   Lab Results  Component Value Date   INSULIN 15.0 12/01/2022   INSULIN 12.1 05/01/2022   Lab Results  Component Value Date   TSH 1.05 02/03/2023   CBC    Component Value Date/Time   WBC 4.8 10/01/2022 1045   RBC 5.55 (H) 10/01/2022 1045   HGB 14.4 10/01/2022 1045   HGB 15.3 05/29/2022 1600   HCT 46.3 (H) 10/01/2022 1045   HCT 47.4 (H) 05/29/2022 1600   PLT 204 10/01/2022 1045   MCV 83.4 10/01/2022 1045   MCV 80 05/29/2022 1600   MCH 25.9 (L) 10/01/2022 1045   MCHC 31.1 10/01/2022 1045   RDW 14.8 10/01/2022 1045   RDW 14.3 05/29/2022 1600   Iron Studies No results found for: "IRON", "TIBC", "FERRITIN", "IRONPCTSAT" Lipid Panel     Component Value Date/Time   CHOL 177 02/03/2023 0000   CHOL 181 05/01/2022 1222   TRIG 89 02/03/2023 0000   HDL 65 02/03/2023 0000   HDL 52 05/01/2022 1222   CHOLHDL 4 05/04/2014 1005   VLDL 15.0 05/04/2014 1005   LDLCALC 94 02/03/2023 0000   LDLCALC 112 (H) 05/01/2022 1222   Hepatic Function Panel     Component Value Date/Time   PROT 7.3 10/01/2022 1045   PROT 7.4  05/29/2022 1600   ALBUMIN 4.1 02/03/2023 0000   ALBUMIN 4.2 05/29/2022 1600   AST 16 02/03/2023 0000   ALT 23 02/03/2023 0000   ALKPHOS 92 02/03/2023 0000   BILITOT 1.2 10/01/2022 1045   BILITOT 0.8 05/29/2022 1600   BILIDIR 0.3 (H) 10/01/2022 1045   IBILI 0.9 10/01/2022 1045      Component Value Date/Time   TSH 1.05 02/03/2023 0000   TSH 0.58 05/04/2014 1005   Nutritional Lab Results  Component Value Date   VD25OH 45.5 12/01/2022   VD25OH 14.1 (L) 05/29/2022   VD25OH 28.71 (L) 05/04/2014    Follow-Up   Return in about 3 weeks (around 07/13/2023) for For Weight Mangement with Dr. Rikki Spearing.Marland Kitchen She was informed of the importance of frequent follow up visits to maximize her success with intensive lifestyle modifications for her multiple health conditions.  Attestation Statement   Reviewed by clinician on day of visit: allergies, medications, problem list, medical history, surgical history, family history, social history, and previous encounter notes.   I have spent 40  minutes in the care of the patient today including: preparing to see patient (e.g. review and interpretation of tests, old notes ), obtaining and/or reviewing separately obtained history, performing a medically appropriate examination or evaluation, counseling and educating the patient, ordering medications, test or procedures, documenting clinical information in the electronic or other health care record, and independently interpreting results and communicating results to the patient, family, or caregiver   Worthy Rancher, MD

## 2023-06-22 NOTE — Assessment & Plan Note (Signed)
Patient has a slower than predicted metabolism. IC 1310 vs. calculated 1559.This may contribute to weight gain, chronic fatigue and difficulty losing weight. We reviewed measures to improve metabolism including progressive strengthening exercises, increasing protein intake at every meal and maintaining adequate hydration and sleep.    Consider repeating metabolism in November this will have to be done in a fasting state.

## 2023-06-30 DIAGNOSIS — Z0000 Encounter for general adult medical examination without abnormal findings: Secondary | ICD-10-CM | POA: Diagnosis not present

## 2023-06-30 DIAGNOSIS — I10 Essential (primary) hypertension: Secondary | ICD-10-CM | POA: Diagnosis not present

## 2023-06-30 DIAGNOSIS — M549 Dorsalgia, unspecified: Secondary | ICD-10-CM | POA: Diagnosis not present

## 2023-06-30 DIAGNOSIS — E785 Hyperlipidemia, unspecified: Secondary | ICD-10-CM | POA: Diagnosis not present

## 2023-07-01 DIAGNOSIS — M4316 Spondylolisthesis, lumbar region: Secondary | ICD-10-CM | POA: Diagnosis not present

## 2023-07-01 DIAGNOSIS — M47816 Spondylosis without myelopathy or radiculopathy, lumbar region: Secondary | ICD-10-CM | POA: Diagnosis not present

## 2023-07-01 DIAGNOSIS — M545 Low back pain, unspecified: Secondary | ICD-10-CM | POA: Diagnosis not present

## 2023-07-08 ENCOUNTER — Other Ambulatory Visit: Payer: Self-pay | Admitting: Internal Medicine

## 2023-07-08 DIAGNOSIS — M4316 Spondylolisthesis, lumbar region: Secondary | ICD-10-CM

## 2023-07-08 DIAGNOSIS — M47816 Spondylosis without myelopathy or radiculopathy, lumbar region: Secondary | ICD-10-CM

## 2023-07-14 ENCOUNTER — Other Ambulatory Visit (INDEPENDENT_AMBULATORY_CARE_PROVIDER_SITE_OTHER): Payer: Self-pay | Admitting: Internal Medicine

## 2023-07-14 DIAGNOSIS — R7303 Prediabetes: Secondary | ICD-10-CM

## 2023-07-14 DIAGNOSIS — E66812 Obesity, class 2: Secondary | ICD-10-CM

## 2023-07-14 DIAGNOSIS — I10 Essential (primary) hypertension: Secondary | ICD-10-CM

## 2023-07-15 DIAGNOSIS — Z1231 Encounter for screening mammogram for malignant neoplasm of breast: Secondary | ICD-10-CM | POA: Diagnosis not present

## 2023-07-17 ENCOUNTER — Other Ambulatory Visit (INDEPENDENT_AMBULATORY_CARE_PROVIDER_SITE_OTHER): Payer: Self-pay | Admitting: Internal Medicine

## 2023-07-17 MED ORDER — TIRZEPATIDE-WEIGHT MANAGEMENT 5 MG/0.5ML ~~LOC~~ SOAJ: 5.0000 mg | SUBCUTANEOUS | 0 refills | Status: DC

## 2023-07-17 NOTE — Telephone Encounter (Signed)
Called patient to refill Zepbound to the next dose.  She has completed 4 dosages of the 2.5 without any adverse effects she is noticing a decrease in hunger and also increase in satiety.  We will increase to 5 mg a week and if she is having good clinical response she will stay at this dose for maintenance.  We discussed that I do not want her to lose more than a pound per week considering her age and risk of sarcopenia.

## 2023-07-20 ENCOUNTER — Other Ambulatory Visit (INDEPENDENT_AMBULATORY_CARE_PROVIDER_SITE_OTHER): Payer: Self-pay | Admitting: Internal Medicine

## 2023-07-20 ENCOUNTER — Other Ambulatory Visit (INDEPENDENT_AMBULATORY_CARE_PROVIDER_SITE_OTHER): Payer: Self-pay | Admitting: Physician Assistant

## 2023-07-20 ENCOUNTER — Telehealth (INDEPENDENT_AMBULATORY_CARE_PROVIDER_SITE_OTHER): Payer: Self-pay | Admitting: Internal Medicine

## 2023-07-20 DIAGNOSIS — E66812 Obesity, class 2: Secondary | ICD-10-CM

## 2023-07-20 DIAGNOSIS — R7303 Prediabetes: Secondary | ICD-10-CM

## 2023-07-20 DIAGNOSIS — I10 Essential (primary) hypertension: Secondary | ICD-10-CM

## 2023-07-20 MED ORDER — TIRZEPATIDE 5 MG/0.5ML ~~LOC~~ SOAJ: 5.0000 mg | SUBCUTANEOUS | 0 refills | Status: DC

## 2023-07-20 NOTE — Progress Notes (Signed)
Zepbound sent in instead of [facility] for [place] as requested.  Westly Hinnant,PA-C

## 2023-07-20 NOTE — Telephone Encounter (Signed)
Patient called in stating Dr. Rikki Spearing told her last week he would call in refill for [facility] with a dosage increase from 2.5 mg -5.0 mg.Patient states the script was sent in for Zepbound instead and  her insurance does not cover Zepbound, she needs script sent in for [facility]. Please send to [facility] on [address] in [place].

## 2023-07-20 NOTE — Telephone Encounter (Signed)
According to Dr. Rikki Spearing note from patient's last OV, he documented patient was agreeable to start [facility]. However he sent in a prescription for Zepboud. Please advise, thank you.   After discussion of treatment options, mechanisms of action, benefits, side effects, contraindications and shared decision making she is agreeable to starting Mounjaro 2.5 mg once a week. Patient also made aware that medication is indicated for long-term management of obesity and the risk of weight regain following discontinuation of treatment and hence the importance of adhering to medical weight loss plan.  We demonstrated use of device and patient using teach back method was able to demonstrate proper technique.

## 2023-07-21 NOTE — Telephone Encounter (Signed)
Left detailed message on patient's voicemail informing her the Greggory Keen has been sent to her pharmacy.

## 2023-07-25 ENCOUNTER — Ambulatory Visit
Admission: RE | Admit: 2023-07-25 | Discharge: 2023-07-25 | Disposition: A | Discharge status: Home / Self Care | Payer: Medicare Other | Source: Ambulatory Visit | Attending: Internal Medicine | Admitting: Internal Medicine

## 2023-07-25 DIAGNOSIS — M47816 Spondylosis without myelopathy or radiculopathy, lumbar region: Secondary | ICD-10-CM

## 2023-07-25 DIAGNOSIS — M4316 Spondylolisthesis, lumbar region: Secondary | ICD-10-CM

## 2023-07-25 DIAGNOSIS — M51362 Other intervertebral disc degeneration, lumbar region with discogenic back pain and lower extremity pain: Secondary | ICD-10-CM | POA: Diagnosis not present

## 2023-07-28 ENCOUNTER — Encounter (INDEPENDENT_AMBULATORY_CARE_PROVIDER_SITE_OTHER): Payer: Self-pay | Admitting: Internal Medicine

## 2023-07-28 ENCOUNTER — Ambulatory Visit (INDEPENDENT_AMBULATORY_CARE_PROVIDER_SITE_OTHER): Payer: Medicare Other | Admitting: Internal Medicine

## 2023-07-28 VITALS — BP 132/73 | HR 69 | Temp 97.8°F | Ht 66.0 in | Wt 199.0 lb

## 2023-07-28 DIAGNOSIS — R7303 Prediabetes: Secondary | ICD-10-CM | POA: Diagnosis not present

## 2023-07-28 DIAGNOSIS — G8929 Other chronic pain: Secondary | ICD-10-CM

## 2023-07-28 DIAGNOSIS — E66812 Obesity, class 2: Secondary | ICD-10-CM

## 2023-07-28 DIAGNOSIS — M5442 Lumbago with sciatica, left side: Secondary | ICD-10-CM | POA: Diagnosis not present

## 2023-07-28 DIAGNOSIS — M5441 Lumbago with sciatica, right side: Secondary | ICD-10-CM | POA: Diagnosis not present

## 2023-07-28 DIAGNOSIS — Z6835 Body mass index (BMI) 35.0-35.9, adult: Secondary | ICD-10-CM

## 2023-07-28 DIAGNOSIS — I10 Essential (primary) hypertension: Secondary | ICD-10-CM | POA: Diagnosis not present

## 2023-07-28 DIAGNOSIS — H00014 Hordeolum externum left upper eyelid: Secondary | ICD-10-CM | POA: Diagnosis not present

## 2023-07-28 MED ORDER — TIRZEPATIDE 5 MG/0.5ML ~~LOC~~ SOAJ: 5.0000 mg | SUBCUTANEOUS | 1 refills | Status: DC

## 2023-07-28 NOTE — Progress Notes (Signed)
 Office: [phone]  /  Fax: [phone]  Weight Summary And Biometrics  Vitals Temp: 97.8 F (36.6 C) BP: 132/73 Pulse Rate: 69 SpO2: 99 %   Anthropometric Measurements Height: 5' 6 (1.676 m) Weight: 199 lb (90.3 kg) BMI (Calculated): 32.13 Weight at Last Visit: 197 lb Weight Lost Since Last Visit: 4 lb Weight Gained Since Last Visit: 0 Starting Weight: 215 lb Total Weight Loss (lbs): 20 lb (9.072 kg) Peak Weight: 217 lb   Body Composition  Body Fat %: 44.3 % Fat Mass (lbs): 86.4 lbs Muscle Mass (lbs): 103 lbs Total Body Water  (lbs): 72.6 lbs Visceral Fat Rating : 13    No data recorded Today's Visit #: 19  Starting Date: 05/11/22   Subjective   Chief Complaint: Obesity  Janice Hutchinson is here to discuss her progress with her obesity treatment plan. Janice Hutchinson is on the the Category 2 Plan and states Janice Hutchinson is following her eating plan approximately 95 % of the time. Janice Hutchinson states Janice Hutchinson is exercising 30-45 minutes 3 times per week.  Interval History:   Discussed the use of AI scribe software for clinical note transcription with the patient, who gave verbal consent to proceed.  History of Present Illness   Janice Hutchinson, a patient with a history of obesity, presents for a follow-up visit regarding her medical weight management. The patient reports a positive experience with the current medication regimen, noting a significant decrease in appetite and subsequent reduction in portion sizes. Janice Hutchinson reports skipping lunch due to lack of hunger, but ensures to consume breakfast and an early evening meal.  The patient has noticed a steady decrease in weight since starting the medication in September 2023, with a minor regain during the holiday season. However, Janice Hutchinson has resumed the downward trend and has reached her lowest weight since 2020. The patient expresses renewed hope and satisfaction with the progress, attributing it to the effectiveness of the medication.  Despite the positive  progress in weight management, the patient reports experiencing back pain, which has disrupted her regular gym routine. Janice Hutchinson has been managing the pain at home with stretching and weight exercises. An MRI was recently conducted to investigate the cause of the back pain, but results are pending at the time of the consultation. The patient expresses concern about the back pain and its potential impact on her exercise routine and overall weight management plan.      Orexigenic Control:  Denies problems with appetite and hunger signals.  Denies problems with satiety and satiation.  Denies problems with eating patterns and portion control.  Denies abnormal cravings. Denies feeling deprived or restricted.   Barriers identified: orthopedic problems, medical conditions or chronic pain affecting mobility.   Pharmacotherapy for weight loss: Janice Hutchinson is currently taking Zepbound  with adequate clinical response  and without side effects..   Assessment and Plan   Treatment Plan For Obesity:  Recommended Dietary Goals  Janice Hutchinson is currently in the action stage of change. As such, her goal is to continue weight management plan. Janice Hutchinson has agreed to: continue current plan  Behavioral Intervention  We discussed the following Behavioral Modification Strategies today: continue to work on maintaining a reduced calorie state, getting the recommended amount of protein, incorporating whole foods, making healthy choices, staying well hydrated and practicing mindfulness when eating..  Additional resources provided today: None  Recommended Physical Activity Goals  Janice Hutchinson has been advised to work up to 150 minutes of moderate intensity aerobic activity a week and strengthening exercises 2-3 times per  week for cardiovascular health, weight loss maintenance and preservation of muscle mass.   Janice Hutchinson has agreed to :  Think about enjoyable ways to increase daily physical activity and overcoming barriers to exercise and Increase  physical activity in their day and reduce sedentary time (increase NEAT).  Pharmacotherapy  We discussed various medication options to help Janice Hutchinson with her weight loss efforts and we both agreed to : continue current anti-obesity medication regimen  Associated Conditions Addressed Today  Essential hypertension Assessment & Plan: Blood pressure at goal for age and risk category.  On amlodipine , bisoprolol  and losartan without adverse effects.    Continue with weight loss therapy. Monitor for symptoms of orthostasis while losing weight. Continue current regimen     Prediabetes Assessment & Plan: Asymptomatic.  Currently being treated with Zepbound  for pharmacoprophylaxis.  Continue with medical weight loss.  Orders: -     Tirzepatide ; Inject 5 mg into the skin once a week.  Dispense: 2 mL; Refill: 1  Class 2 obesity without serious comorbidity with body mass index (BMI) of 35.0 to 35.9 in adult, unspecified obesity type Assessment & Plan: See obesity treatment plan.  Currently on Zepbound  5 mg once a week with adequate clinical response.  Janice Hutchinson will continue at current dose.  We discussed the risk of sarcopenia in older adults so I do not want her weight loss rate to exceed 1 pound per week.  Janice Hutchinson was also reminded of the importance of consuming 30 g of protein with each of her meals and engaging in strengthening exercises.  Orders: -     Tirzepatide ; Inject 5 mg into the skin once a week.  Dispense: 2 mL; Refill: 1  Chronic midline low back pain with bilateral sciatica Assessment & Plan: Janice Hutchinson has centralized lower back pain with radicular symptoms anterior thighs to worsen with ambulation.  Denies any muscle weakness or paresthesias.  An MRI has been ordered by primary care team results are pending.  I suspect Janice Hutchinson may have a degree of spinal stenosis.  Janice Hutchinson was provided with a back conditioning program from the [employer] of orthopedic surgeons.  Janice Hutchinson will follow-up with primary care  physician for test results.     Objective   Physical Exam:  Blood pressure 132/73, pulse 69, temperature 97.8 F (36.6 C), height 5' 6 (1.676 m), weight 199 lb (90.3 kg), SpO2 99%. Body mass index is 32.12 kg/m.  General: Janice Hutchinson is overweight, cooperative, alert, well developed, and in no acute distress. PSYCH: Has normal mood, affect and thought process.   HEENT: EOMI, sclerae are anicteric. Lungs: Normal breathing effort, no conversational dyspnea. Extremities: No edema.  Neurologic: No gross sensory or motor deficits. No tremors or fasciculations noted.    Diagnostic Data Reviewed:  BMET    Component Value Date/Time   NA 140 02/03/2023 0000   K 4.6 02/03/2023 0000   CL 104 02/03/2023 0000   CO2 29 (A) 02/03/2023 0000   GLUCOSE 84 10/01/2022 1045   BUN 18 02/03/2023 0000   CREATININE 1.0 02/03/2023 0000   CREATININE 1.05 (H) 10/01/2022 1045   CALCIUM  10.5 02/03/2023 0000   GFRNONAA 57 (L) 10/01/2022 1045   GFRAA >60 03/17/2017 1048   Lab Results  Component Value Date   HGBA1C 4.7 (L) 05/29/2022   Lab Results  Component Value Date   INSULIN  15.0 12/01/2022   INSULIN  12.1 05/01/2022   Lab Results  Component Value Date   TSH 1.05 02/03/2023   CBC    Component  Value Date/Time   WBC 4.8 10/01/2022 1045   RBC 5.55 (H) 10/01/2022 1045   HGB 14.4 10/01/2022 1045   HGB 15.3 05/29/2022 1600   HCT 46.3 (H) 10/01/2022 1045   HCT 47.4 (H) 05/29/2022 1600   PLT 204 10/01/2022 1045   MCV 83.4 10/01/2022 1045   MCV 80 05/29/2022 1600   MCH 25.9 (L) 10/01/2022 1045   MCHC 31.1 10/01/2022 1045   RDW 14.8 10/01/2022 1045   RDW 14.3 05/29/2022 1600   Iron Studies No results found for: IRON, TIBC, FERRITIN, IRONPCTSAT Lipid Panel     Component Value Date/Time   CHOL 177 02/03/2023 0000   CHOL 181 05/01/2022 1222   TRIG 89 02/03/2023 0000   HDL 65 02/03/2023 0000   HDL 52 05/01/2022 1222   CHOLHDL 4 05/04/2014 1005   VLDL 15.0 05/04/2014 1005    LDLCALC 94 02/03/2023 0000   LDLCALC 112 (H) 05/01/2022 1222   Hepatic Function Panel     Component Value Date/Time   PROT 7.3 10/01/2022 1045   PROT 7.4 05/29/2022 1600   ALBUMIN 4.1 02/03/2023 0000   ALBUMIN 4.2 05/29/2022 1600   AST 16 02/03/2023 0000   ALT 23 02/03/2023 0000   ALKPHOS 92 02/03/2023 0000   BILITOT 1.2 10/01/2022 1045   BILITOT 0.8 05/29/2022 1600   BILIDIR 0.3 (H) 10/01/2022 1045   IBILI 0.9 10/01/2022 1045      Component Value Date/Time   TSH 1.05 02/03/2023 0000   TSH 0.58 05/04/2014 1005   Nutritional Lab Results  Component Value Date   VD25OH 45.5 12/01/2022   VD25OH 14.1 (L) 05/29/2022   VD25OH 28.71 (L) 05/04/2014    Follow-Up   Return in about 4 weeks (around 08/25/2023) for For Weight Mangement with Dr. Francyne.SABRA Janice Hutchinson was informed of the importance of frequent follow up visits to maximize her success with intensive lifestyle modifications for her multiple health conditions.  Attestation Statement   Reviewed by clinician on day of visit: allergies, medications, problem list, medical history, surgical history, family history, social history, and previous encounter notes.     Lucas Francyne, MD

## 2023-07-29 NOTE — Assessment & Plan Note (Signed)
 Asymptomatic.  Currently being treated with Zepbound for pharmacoprophylaxis.  Continue with medical weight loss.

## 2023-07-29 NOTE — Assessment & Plan Note (Addendum)
 She has centralized lower back pain with radicular symptoms anterior thighs to worsen with ambulation.  Denies any muscle weakness or paresthesias.  An MRI has been ordered by primary care team results are pending.  I suspect she may have a degree of spinal stenosis.  She was provided with a back conditioning program from the [employer] of orthopedic surgeons.  She will follow-up with primary care physician for test results.

## 2023-07-29 NOTE — Assessment & Plan Note (Signed)
 See obesity treatment plan.  Currently on Zepbound  5 mg once a week with adequate clinical response.  She will continue at current dose.  We discussed the risk of sarcopenia in older adults so I do not want her weight loss rate to exceed 1 pound per week.  She was also reminded of the importance of consuming 30 g of protein with each of her meals and engaging in strengthening exercises.

## 2023-07-29 NOTE — Assessment & Plan Note (Signed)
Blood pressure at goal for age and risk category.  On amlodipine, bisoprolol and losartan without adverse effects.    Continue with weight loss therapy. Monitor for symptoms of orthostasis while losing weight. Continue current regimen

## 2023-08-18 ENCOUNTER — Telehealth (INDEPENDENT_AMBULATORY_CARE_PROVIDER_SITE_OTHER): Payer: Self-pay | Admitting: Internal Medicine

## 2023-08-18 ENCOUNTER — Other Ambulatory Visit (INDEPENDENT_AMBULATORY_CARE_PROVIDER_SITE_OTHER): Payer: Self-pay | Admitting: Internal Medicine

## 2023-08-18 DIAGNOSIS — E66812 Obesity, class 2: Secondary | ICD-10-CM

## 2023-08-18 DIAGNOSIS — I10 Essential (primary) hypertension: Secondary | ICD-10-CM | POA: Diagnosis not present

## 2023-08-18 DIAGNOSIS — R7303 Prediabetes: Secondary | ICD-10-CM

## 2023-08-18 DIAGNOSIS — E785 Hyperlipidemia, unspecified: Secondary | ICD-10-CM | POA: Diagnosis not present

## 2023-08-18 DIAGNOSIS — M48062 Spinal stenosis, lumbar region with neurogenic claudication: Secondary | ICD-10-CM | POA: Diagnosis not present

## 2023-08-18 NOTE — Telephone Encounter (Signed)
01/21 pt needs prior auth for [employer]

## 2023-08-19 NOTE — Telephone Encounter (Signed)
PA for [facility] 5mg  has been submitted, awaiting PA questions.    Outcome Denied on January 20 by [employer] 2017 NCPDP Request Reference Number: [id number]. MOUNJARO INJ 5MG /0.5 is denied due to Plan Exclusion. For further questions, call [phone].

## 2023-08-24 DIAGNOSIS — M4316 Spondylolisthesis, lumbar region: Secondary | ICD-10-CM | POA: Diagnosis not present

## 2023-08-25 ENCOUNTER — Encounter (INDEPENDENT_AMBULATORY_CARE_PROVIDER_SITE_OTHER): Payer: Self-pay | Admitting: Internal Medicine

## 2023-08-25 ENCOUNTER — Ambulatory Visit (INDEPENDENT_AMBULATORY_CARE_PROVIDER_SITE_OTHER): Payer: Medicare Other | Admitting: Internal Medicine

## 2023-08-25 VITALS — BP 134/78 | HR 62 | Temp 97.6°F | Ht 66.0 in | Wt 191.0 lb

## 2023-08-25 DIAGNOSIS — I10 Essential (primary) hypertension: Secondary | ICD-10-CM | POA: Diagnosis not present

## 2023-08-25 DIAGNOSIS — T383X5D Adverse effect of insulin and oral hypoglycemic [antidiabetic] drugs, subsequent encounter: Secondary | ICD-10-CM

## 2023-08-25 DIAGNOSIS — R7303 Prediabetes: Secondary | ICD-10-CM | POA: Diagnosis not present

## 2023-08-25 DIAGNOSIS — E66812 Obesity, class 2: Secondary | ICD-10-CM

## 2023-08-25 DIAGNOSIS — Z6835 Body mass index (BMI) 35.0-35.9, adult: Secondary | ICD-10-CM

## 2023-08-25 MED ORDER — TIRZEPATIDE 5 MG/0.5ML ~~LOC~~ SOAJ: 5.0000 mg | SUBCUTANEOUS | 1 refills | Status: DC

## 2023-08-25 NOTE — Assessment & Plan Note (Signed)
Blood pressure has improved.  On bisoprolol, amlodipine and losartan and hydrochlorothiazide without adverse effects.  I reviewed most recent labs done at her PCPs office which shows a GFR of 62 which is stable and improved.  Continue with weight loss therapy.  Monitor for symptoms of orthostasis while losing weight. Continue current regimen and home monitoring for a goal blood pressure of 120/80.

## 2023-08-25 NOTE — Assessment & Plan Note (Addendum)
Check hemoglobin A1c today.  History of intolerance to metformin.  Patient is high risk for progression.  Would benefit from pharmacoprophylaxis with Mounjaro.  She continues to maintain a diet with a low glycemic load

## 2023-08-25 NOTE — Progress Notes (Signed)
Office: [phone]  /  Fax: [phone]  Weight Summary And Biometrics  Vitals Temp: 97.6 F (36.4 C) BP: 134/78 Pulse Rate: 62 SpO2: 98 %   Anthropometric Measurements Height: 5\' 6"  (1.676 m) Weight: 191 lb (86.6 kg) BMI (Calculated): 30.84 Weight at Last Visit: 195 lb Weight Lost Since Last Visit: 4 lb Weight Gained Since Last Visit: 0 lb Starting Weight: 215 lb Total Weight Loss (lbs): 24 lb (10.9 kg) Peak Weight: 217 lb   Body Composition  Body Fat %: 44.8 % Fat Mass (lbs): 85.8 lbs Muscle Mass (lbs): 100.4 lbs Total Body Water (lbs): 72.6 lbs Visceral Fat Rating : 13    No data recorded No data recorded No data recorded  Subjective   Chief Complaint: Obesity  Janice Hutchinson is here to discuss her progress with her obesity treatment plan. She is on the following a lower carbohydrate, vegetable and lean protein rich diet plan / Category 2 meal plan and states she is following her eating plan approximately 95 % of the time. She states she is not exercising.  Weight Progress Since Last Visit:  Since last office visit she has lost 4 pounds. She reports good adherence to reduced calorie nutritional plan. She has been working on reading food labels, not skipping meals, increasing protein intake at every meal, drinking more water, making healthier choices, reducing portion sizes, and incorporating more whole foods   She tells me that her [employer] Keen was no longer being covered.  She was prescribed Mounjaro for diabetes prevention due to increasing hemoglobin A1c, and intolerance to metformin.  Challenges affecting patient progress: orthopedic problems, medical conditions or chronic pain affecting mobility.   Orexigenic Control: Denies problems with appetite and hunger signals.  Denies problems with satiety and satiation.  Denies problems with eating patterns and portion control.  Denies abnormal cravings. Denies feeling deprived or restricted.   Pharmacotherapy for  weight management: She is currently taking Monjauro with prediabetes / insulin resistance as the primary indication with adequate clinical response  and without side effects.. Patient did not respond or had adverse reaction to metformin in the past..   Assessment and Plan   Treatment Plan For Obesity:  Recommended Dietary Goals  Janice Hutchinson is currently in the action stage of change. As such, her goal is to continue weight management plan. She has agreed to: continue current plan  Behavioral Health and Counseling  We discussed the following behavioral modification strategies today: continue to work on maintaining a reduced calorie state, getting the recommended amount of protein, incorporating whole foods, making healthy choices, staying well hydrated and practicing mindfulness when eating..  Additional education and resources provided today: None  Recommended Physical Activity Goals  Janice Hutchinson has been advised to work up to 150 minutes of moderate intensity aerobic activity a week and strengthening exercises 2-3 times per week for cardiovascular health, weight loss maintenance and preservation of muscle mass.   She has agreed to :  Think about enjoyable ways to increase daily physical activity and overcoming barriers to exercise and Increase physical activity in their day and reduce sedentary time (increase NEAT).  Pharmacotherapy  We discussed various medication options to help Janice Hutchinson with her weight loss efforts and we both agreed to : Continue Mounjaro at current dose.  Patient benefits from ongoing GLP-1 therapy due to progressive prediabetes, intolerance to metformin and associated with obesity.  Associated Conditions Impacted by Obesity Treatment  Prediabetes Assessment & Plan: Check hemoglobin A1c today.  History of intolerance to metformin.  Patient is high risk for progression.  Would benefit from pharmacoprophylaxis with Mounjaro.  She continues to maintain a diet with a low glycemic  load  Orders: -     Tirzepatide; Inject 5 mg into the skin once a week.  Dispense: 2 mL; Refill: 1 -     Hemoglobin A1c  Class 2 obesity without serious comorbidity with body mass index (BMI) of 35.0 to 35.9 in adult, unspecified obesity type -     Tirzepatide; Inject 5 mg into the skin once a week.  Dispense: 2 mL; Refill: 1  Hypertension, essential Assessment & Plan: Blood pressure has improved.  On bisoprolol, amlodipine and losartan and hydrochlorothiazide without adverse effects.  I reviewed most recent labs done at her PCPs office which shows a GFR of 62 which is stable and improved.  Continue with weight loss therapy.  Monitor for symptoms of orthostasis while losing weight. Continue current regimen and home monitoring for a goal blood pressure of 120/80.     Orders: -     Tirzepatide; Inject 5 mg into the skin once a week.  Dispense: 2 mL; Refill: 1  Adverse effect of metformin, subsequent encounter -     Tirzepatide; Inject 5 mg into the skin once a week.  Dispense: 2 mL; Refill: 1     Objective   Physical Exam:  Blood pressure 134/78, pulse 62, temperature 97.6 F (36.4 C), height 5\' 6"  (1.676 m), weight 191 lb (86.6 kg), SpO2 98%. Body mass index is 30.83 kg/m.  General: She is overweight, cooperative, alert, well developed, and in no acute distress. PSYCH: Has normal mood, affect and thought process.   HEENT: EOMI, sclerae are anicteric. Lungs: Normal breathing effort, no conversational dyspnea. Extremities: No edema.  Neurologic: No gross sensory or motor deficits. No tremors or fasciculations noted.    Diagnostic Data Reviewed:  BMET    Component Value Date/Time   NA 140 02/03/2023 0000   K 4.6 02/03/2023 0000   CL 104 02/03/2023 0000   CO2 29 (A) 02/03/2023 0000   GLUCOSE 84 10/01/2022 1045   BUN 18 02/03/2023 0000   CREATININE 1.0 02/03/2023 0000   CREATININE 1.05 (H) 10/01/2022 1045   CALCIUM 10.5 02/03/2023 0000   GFRNONAA 57 (L) 10/01/2022 1045    GFRAA >60 03/17/2017 1048   Lab Results  Component Value Date   HGBA1C 4.7 (L) 05/29/2022   Lab Results  Component Value Date   INSULIN 15.0 12/01/2022   INSULIN 12.1 05/01/2022   Lab Results  Component Value Date   TSH 1.05 02/03/2023   CBC    Component Value Date/Time   WBC 4.8 10/01/2022 1045   RBC 5.55 (H) 10/01/2022 1045   HGB 14.4 10/01/2022 1045   HGB 15.3 05/29/2022 1600   HCT 46.3 (H) 10/01/2022 1045   HCT 47.4 (H) 05/29/2022 1600   PLT 204 10/01/2022 1045   MCV 83.4 10/01/2022 1045   MCV 80 05/29/2022 1600   MCH 25.9 (L) 10/01/2022 1045   MCHC 31.1 10/01/2022 1045   RDW 14.8 10/01/2022 1045   RDW 14.3 05/29/2022 1600   Iron Studies No results found for: "IRON", "TIBC", "FERRITIN", "IRONPCTSAT" Lipid Panel     Component Value Date/Time   CHOL 177 02/03/2023 0000   CHOL 181 05/01/2022 1222   TRIG 89 02/03/2023 0000   HDL 65 02/03/2023 0000   HDL 52 05/01/2022 1222   CHOLHDL 4 05/04/2014 1005   VLDL 15.0 05/04/2014 1005   LDLCALC 94  02/03/2023 0000   LDLCALC 112 (H) 05/01/2022 1222   Hepatic Function Panel     Component Value Date/Time   PROT 7.3 10/01/2022 1045   PROT 7.4 05/29/2022 1600   ALBUMIN 4.1 02/03/2023 0000   ALBUMIN 4.2 05/29/2022 1600   AST 16 02/03/2023 0000   ALT 23 02/03/2023 0000   ALKPHOS 92 02/03/2023 0000   BILITOT 1.2 10/01/2022 1045   BILITOT 0.8 05/29/2022 1600   BILIDIR 0.3 (H) 10/01/2022 1045   IBILI 0.9 10/01/2022 1045      Component Value Date/Time   TSH 1.05 02/03/2023 0000   TSH 0.58 05/04/2014 1005   Nutritional Lab Results  Component Value Date   VD25OH 45.5 12/01/2022   VD25OH 14.1 (L) 05/29/2022   VD25OH 28.71 (L) 05/04/2014    Follow-Up   No follow-ups on file.Marland Kitchen She was informed of the importance of frequent follow up visits to maximize her success with intensive lifestyle modifications for her multiple health conditions.  Attestation Statement   Reviewed by clinician on day of visit:  allergies, medications, problem list, medical history, surgical history, family history, social history, and previous encounter notes.     Worthy Rancher, MD

## 2023-08-26 ENCOUNTER — Telehealth: Payer: Self-pay

## 2023-08-26 ENCOUNTER — Encounter (INDEPENDENT_AMBULATORY_CARE_PROVIDER_SITE_OTHER): Payer: Self-pay | Admitting: Internal Medicine

## 2023-08-26 LAB — HEMOGLOBIN A1C
Est. average glucose Bld gHb Est-mCnc: 85 mg/dL
Hgb A1c MFr Bld: 4.6 % — ABNORMAL LOW (ref 4.8–5.6)

## 2023-08-26 NOTE — Telephone Encounter (Signed)
Per Cover My Meds: We are unable to process your request for prior authorization for [facility] INJ 5MG /0.5 for the above member due to [employer] has a denied request on file for [facility] INJ 5MG /0.5 for this member. Please refer to the appeals process outlined in the original denial or contact Prior Authorization Department at [phone] for further questions.

## 2023-08-26 NOTE — Telephone Encounter (Signed)
PA submitted through Cover My Meds for [facility] Awaiting insurance determination. Key: Roby Lofts

## 2023-09-17 ENCOUNTER — Telehealth (INDEPENDENT_AMBULATORY_CARE_PROVIDER_SITE_OTHER): Payer: Self-pay | Admitting: *Deleted

## 2023-09-17 DIAGNOSIS — M4316 Spondylolisthesis, lumbar region: Secondary | ICD-10-CM | POA: Diagnosis not present

## 2023-09-17 NOTE — Telephone Encounter (Signed)
 Janice Hutchinson (Key: [id number])  [employer] is unable to complete your request at this time. Please see more information at the bottom of the page.   Information regarding your request We are unable to process your request for prior authorization for [facility] INJ 5MG /0.5 for the above member due to [employer] has a denied request on file for [facility] INJ 5MG /0.5 for this member. Please refer to the appeals process outlined in the original denial or contact Prior Authorization Department at [phone] for further questions.   Patient has been sent a Mychart message in reference to this.

## 2023-09-22 ENCOUNTER — Encounter (INDEPENDENT_AMBULATORY_CARE_PROVIDER_SITE_OTHER): Payer: Self-pay | Admitting: Internal Medicine

## 2023-09-22 ENCOUNTER — Ambulatory Visit (INDEPENDENT_AMBULATORY_CARE_PROVIDER_SITE_OTHER): Payer: Medicare Other | Admitting: Internal Medicine

## 2023-09-22 DIAGNOSIS — I10 Essential (primary) hypertension: Secondary | ICD-10-CM | POA: Diagnosis not present

## 2023-09-22 DIAGNOSIS — Z6835 Body mass index (BMI) 35.0-35.9, adult: Secondary | ICD-10-CM | POA: Diagnosis not present

## 2023-09-22 DIAGNOSIS — E66812 Obesity, class 2: Secondary | ICD-10-CM | POA: Diagnosis not present

## 2023-09-22 DIAGNOSIS — R7303 Prediabetes: Secondary | ICD-10-CM

## 2023-09-22 MED ORDER — ZONISAMIDE 25 MG PO CAPS: 25.0000 mg | ORAL_CAPSULE | Freq: Every day | ORAL | 0 refills | Status: DC

## 2023-09-22 NOTE — Progress Notes (Deleted)
 Office: [phone]  /  Fax: [phone]  Weight Summary And Biometrics  Vitals Temp: 97.8 F (36.6 C) BP: 138/78 Pulse Rate: 64 SpO2: 98 %   Anthropometric Measurements Height: 5\' 6"  (1.676 m) Weight: 193 lb (87.5 kg) BMI (Calculated): 31.17 Weight at Last Visit: 191 lb Weight Lost Since Last Visit: 0 lb Weight Gained Since Last Visit: 2 lb Starting Weight: 215 lb Total Weight Loss (lbs): 22 lb (9.979 kg) Peak Weight: 217 lb   Body Composition  Body Fat %: 45.1 % Fat Mass (lbs): 87.2 lbs Muscle Mass (lbs): 100.8 lbs Total Body Water (lbs): 75.8 lbs Visceral Fat Rating : 13    No data recorded Today's Visit #: 19  Starting Date: 05/11/22   Subjective   Chief Complaint: Obesity  Discussed the use of AI scribe software for clinical note transcription with the patient, who gave verbal consent to proceed.  History of Present Illness             Assessment and Plan   Treatment Plan For Obesity:  Recommended Dietary Goals  Rubyann is currently in the action stage of change. As such, her goal is to continue weight management plan. She has agreed to: {EMWTLOSSPLAN:29297::"continue current plan"}  Behavioral Health and Counseling  We discussed the following behavioral modification strategies today: {EMWMwtlossstrategies:28914::"continue to work on maintaining a reduced calorie state, getting the recommended amount of protein, incorporating whole foods, making healthy choices, staying well hydrated and practicing mindfulness when eating."}.  Additional education and resources provided today: {EMadditionalresources:29169::"None"}  Recommended Physical Activity Goals  Camiah has been advised to work up to 150 minutes of moderate intensity aerobic activity a week and strengthening exercises 2-3 times per week for cardiovascular health, weight loss maintenance and preservation of muscle mass.   She has agreed to :  {EMEXERCISE:28847::"Think about enjoyable  ways to increase daily physical activity and overcoming barriers to exercise","Increase physical activity in their day and reduce sedentary time (increase NEAT)."}  Pharmacotherapy  We discussed various medication options to help Kingsley with her weight loss efforts and we both agreed to : {EMagreedrx:29170}  Associated Conditions Impacted by Obesity Treatment  Assessment & Plan Prediabetes  Class 2 obesity without serious comorbidity with body mass index (BMI) of 35.0 to 35.9 in adult, unspecified obesity type  Hypertension, essential  Adverse effect of metformin, subsequent encounter     Assessment and Plan               Objective   Physical Exam:  Blood pressure 138/78, pulse 64, temperature 97.8 F (36.6 C), height 5\' 6"  (1.676 m), weight 193 lb (87.5 kg), SpO2 98%. Body mass index is 31.15 kg/m.  General: She is overweight, cooperative, alert, well developed, and in no acute distress. PSYCH: Has normal mood, affect and thought process.   HEENT: EOMI, sclerae are anicteric. Lungs: Normal breathing effort, no conversational dyspnea. Extremities: No edema.  Neurologic: No gross sensory or motor deficits. No tremors or fasciculations noted.    Diagnostic Data Reviewed:  BMET    Component Value Date/Time   NA 140 02/03/2023 0000   K 4.6 02/03/2023 0000   CL 104 02/03/2023 0000   CO2 29 (A) 02/03/2023 0000   GLUCOSE 84 10/01/2022 1045   BUN 18 02/03/2023 0000   CREATININE 1.0 02/03/2023 0000   CREATININE 1.05 (H) 10/01/2022 1045   CALCIUM 10.5 02/03/2023 0000   GFRNONAA 57 (L) 10/01/2022 1045   GFRAA >60 03/17/2017 1048   Lab Results  Component  Value Date   HGBA1C 4.6 (L) 08/25/2023   HGBA1C 4.7 (L) 05/29/2022   Lab Results  Component Value Date   INSULIN 15.0 12/01/2022   INSULIN 12.1 05/01/2022   Lab Results  Component Value Date   TSH 1.05 02/03/2023   CBC    Component Value Date/Time   WBC 4.8 10/01/2022 1045   RBC 5.55 (H) 10/01/2022  1045   HGB 14.4 10/01/2022 1045   HGB 15.3 05/29/2022 1600   HCT 46.3 (H) 10/01/2022 1045   HCT 47.4 (H) 05/29/2022 1600   PLT 204 10/01/2022 1045   MCV 83.4 10/01/2022 1045   MCV 80 05/29/2022 1600   MCH 25.9 (L) 10/01/2022 1045   MCHC 31.1 10/01/2022 1045   RDW 14.8 10/01/2022 1045   RDW 14.3 05/29/2022 1600   Iron Studies No results found for: "IRON", "TIBC", "FERRITIN", "IRONPCTSAT" Lipid Panel     Component Value Date/Time   CHOL 177 02/03/2023 0000   CHOL 181 05/01/2022 1222   TRIG 89 02/03/2023 0000   HDL 65 02/03/2023 0000   HDL 52 05/01/2022 1222   CHOLHDL 4 05/04/2014 1005   VLDL 15.0 05/04/2014 1005   LDLCALC 94 02/03/2023 0000   LDLCALC 112 (H) 05/01/2022 1222   Hepatic Function Panel     Component Value Date/Time   PROT 7.3 10/01/2022 1045   PROT 7.4 05/29/2022 1600   ALBUMIN 4.1 02/03/2023 0000   ALBUMIN 4.2 05/29/2022 1600   AST 16 02/03/2023 0000   ALT 23 02/03/2023 0000   ALKPHOS 92 02/03/2023 0000   BILITOT 1.2 10/01/2022 1045   BILITOT 0.8 05/29/2022 1600   BILIDIR 0.3 (H) 10/01/2022 1045   IBILI 0.9 10/01/2022 1045      Component Value Date/Time   TSH 1.05 02/03/2023 0000   TSH 0.58 05/04/2014 1005   Nutritional Lab Results  Component Value Date   VD25OH 45.5 12/01/2022   VD25OH 14.1 (L) 05/29/2022   VD25OH 28.71 (L) 05/04/2014    Follow-Up   No follow-ups on file.Marland Kitchen She was informed of the importance of frequent follow up visits to maximize her success with intensive lifestyle modifications for her multiple health conditions.  Attestation Statement   Reviewed by clinician on day of visit: allergies, medications, problem list, medical history, surgical history, family history, social history, and previous encounter notes.     Worthy Rancher, MD

## 2023-09-22 NOTE — Progress Notes (Signed)
 Office: [phone]  /  Fax: [phone]  Weight Summary And Biometrics  Vitals Temp: 97.8 F (36.6 C) BP: 138/78 Pulse Rate: 64 SpO2: 98 %   Anthropometric Measurements Height: 5\' 6"  (1.676 m) Weight: 193 lb (87.5 kg) BMI (Calculated): 31.17 Weight at Last Visit: 191 lb Weight Lost Since Last Visit: 0 lb Weight Gained Since Last Visit: 2 lb Starting Weight: 215 lb Total Weight Loss (lbs): 22 lb (9.979 kg) Peak Weight: 217 lb   Body Composition  Body Fat %: 45.1 % Fat Mass (lbs): 87.2 lbs Muscle Mass (lbs): 100.8 lbs Total Body Water (lbs): 75.8 lbs Visceral Fat Rating : 13    No data recorded Today's Visit #: 19  Starting Date: 05/11/22   Subjective   Chief Complaint: Obesity  Discussed the use of AI scribe software for clinical note transcription with the patient, who gave verbal consent to proceed.  History of Present Illness   Janice Hutchinson is a 73 year old female with obesity, prediabetes, and hypertension who presents for medical weight management.  She has gained two pounds since her last visit. She follows a reduced calorie nutrition plan 90% of the time but is not tracking calories. She consumes more whole foods but is concerned about inadequate protein intake. She maintains adequate hydration and does not skip meals. Currently, she is not engaging in physical exercise.  She has not been to the gym due to back issues and has started physical therapy twice a day, which is providing some relief. She has a history of cataract surgery in October and back issues for which she consulted a neurosurgeon and was referred to physical therapy. She is not interested in surgical intervention for her back.  She sleeps about seven to nine hours per night but experiences high levels of stress. Her husband is the primary caregiver for his 31 year old mother and his 24 year old mentally challenged brother, which adds stress to her life. She is trying to support  her husband, who is stressed due to family responsibilities.          Assessment and Plan   Treatment Plan For Obesity:  Recommended Dietary Goals  Mannie is currently in the action stage of change. As such, her goal is to continue weight management plan. She has agreed to: continue current plan  Behavioral Health and Counseling  We discussed the following behavioral modification strategies today: continue to work on maintaining a reduced calorie state, getting the recommended amount of protein, incorporating whole foods, making healthy choices, staying well hydrated and practicing mindfulness when eating..  Additional education and resources provided today: None  Recommended Physical Activity Goals  Nickia has been advised to work up to 150 minutes of moderate intensity aerobic activity a week and strengthening exercises 2-3 times per week for cardiovascular health, weight loss maintenance and preservation of muscle mass.   She has agreed to :  Think about enjoyable ways to increase daily physical activity and overcoming barriers to exercise and Increase physical activity in their day and reduce sedentary time (increase NEAT).  Pharmacotherapy  We discussed various medication options to help Kailoni with her weight loss efforts and we both agreed to : start anti-obesity medication.  In addition to reduced calorie nutrition plan (RCNP), behavioral strategies and physical activity, Varshini would benefit from pharmacotherapy to assist with hunger signals, satiety and cravings. This will reduce obesity-related health risks by inducing weight loss, and help reduce food consumption and adherence to [facility]) . It may also  improve QOL by improving self-confidence and reduce the  setbacks associated with metabolic adaptations.  After discussion of treatment options, mechanisms of action, benefits, side effects, contraindications and shared decision making she is agreeable to starting zonisamide 25  mg once in the evening. Patient also made aware that medication is indicated for long-term management of obesity and the risk of weight regain following discontinuation of treatment and hence the importance of adhering to medical weight loss plan.    We had prescribed GLP-1 but this was denied by her insurance  Associated Conditions Impacted by Obesity Treatment  Prediabetes  Class 2 obesity without serious comorbidity with body mass index (BMI) of 35.0 to 35.9 in adult, unspecified obesity type - Plan: zonisamide (ZONEGRAN) 25 MG capsule  Hypertension, essential   Assessment and Plan    Obesity Gained two pounds since last visit despite adherence to a reduced-calorie diet. Not tracking calories but consuming more whole foods and staying hydrated. Currently not exercising and experiencing high stress levels. Previous use of Mounjaro was effective but not covered by insurance. Discussed alternative medications, including Qsymia (contraindicated due to hypertension) and topiramate (off-label for weight loss). Recommended zonisamide, a cost-effective alternative with fewer side effects, including numbness, tingling, and taste changes. It does not affect blood pressure and is taken in the evening for appetite suppression. - Prescribe zonisamide 25 mg daily in the evening - Follow up in 4 weeks  Prediabetes Previous metformin use discontinued due to diarrhea. Continuing dietary modifications. - Continue dietary modifications  Hypertension Blood pressure close to goal on amlodipine, bisoprolol and losartan without adverse effects. - Monitor blood pressure regularly  Chronic Back Pain Undergoing physical therapy with some improvement. Not interested in surgical intervention. Advised against injections per neurosurgeon's recommendation. - Continue physical therapy - Follow up with neurosurgeon as needed  General Health Maintenance Reports adequate hydration, regular sleep (7-9 hours per  night), and no meal skipping. Experiencing high stress due to family caregiving responsibilities. - Encourage stress management techniques - Encourage regular physical activity as tolerated  Follow-up - Schedule follow-up appointment in 4 weeks.          Objective   Physical Exam:  Blood pressure 138/78, pulse 64, temperature 97.8 F (36.6 C), height 5\' 6"  (1.676 m), weight 193 lb (87.5 kg), SpO2 98%. Body mass index is 31.15 kg/m.  General: She is overweight, cooperative, alert, well developed, and in no acute distress. PSYCH: Has normal mood, affect and thought process.   HEENT: EOMI, sclerae are anicteric. Lungs: Normal breathing effort, no conversational dyspnea. Extremities: No edema.  Neurologic: No gross sensory or motor deficits. No tremors or fasciculations noted.    Diagnostic Data Reviewed:  BMET    Component Value Date/Time   NA 140 02/03/2023 0000   K 4.6 02/03/2023 0000   CL 104 02/03/2023 0000   CO2 29 (A) 02/03/2023 0000   GLUCOSE 84 10/01/2022 1045   BUN 18 02/03/2023 0000   CREATININE 1.0 02/03/2023 0000   CREATININE 1.05 (H) 10/01/2022 1045   CALCIUM 10.5 02/03/2023 0000   GFRNONAA 57 (L) 10/01/2022 1045   GFRAA >60 03/17/2017 1048   Lab Results  Component Value Date   HGBA1C 4.6 (L) 08/25/2023   HGBA1C 4.7 (L) 05/29/2022   Lab Results  Component Value Date   INSULIN 15.0 12/01/2022   INSULIN 12.1 05/01/2022   Lab Results  Component Value Date   TSH 1.05 02/03/2023   CBC    Component Value Date/Time   WBC 4.8  10/01/2022 1045   RBC 5.55 (H) 10/01/2022 1045   HGB 14.4 10/01/2022 1045   HGB 15.3 05/29/2022 1600   HCT 46.3 (H) 10/01/2022 1045   HCT 47.4 (H) 05/29/2022 1600   PLT 204 10/01/2022 1045   MCV 83.4 10/01/2022 1045   MCV 80 05/29/2022 1600   MCH 25.9 (L) 10/01/2022 1045   MCHC 31.1 10/01/2022 1045   RDW 14.8 10/01/2022 1045   RDW 14.3 05/29/2022 1600   Iron Studies No results found for: "IRON", "TIBC", "FERRITIN",  "IRONPCTSAT" Lipid Panel     Component Value Date/Time   CHOL 177 02/03/2023 0000   CHOL 181 05/01/2022 1222   TRIG 89 02/03/2023 0000   HDL 65 02/03/2023 0000   HDL 52 05/01/2022 1222   CHOLHDL 4 05/04/2014 1005   VLDL 15.0 05/04/2014 1005   LDLCALC 94 02/03/2023 0000   LDLCALC 112 (H) 05/01/2022 1222   Hepatic Function Panel     Component Value Date/Time   PROT 7.3 10/01/2022 1045   PROT 7.4 05/29/2022 1600   ALBUMIN 4.1 02/03/2023 0000   ALBUMIN 4.2 05/29/2022 1600   AST 16 02/03/2023 0000   ALT 23 02/03/2023 0000   ALKPHOS 92 02/03/2023 0000   BILITOT 1.2 10/01/2022 1045   BILITOT 0.8 05/29/2022 1600   BILIDIR 0.3 (H) 10/01/2022 1045   IBILI 0.9 10/01/2022 1045      Component Value Date/Time   TSH 1.05 02/03/2023 0000   TSH 0.58 05/04/2014 1005   Nutritional Lab Results  Component Value Date   VD25OH 45.5 12/01/2022   VD25OH 14.1 (L) 05/29/2022   VD25OH 28.71 (L) 05/04/2014    Follow-Up   Return in about 4 weeks (around 10/20/2023) for For Weight Mangement with Dr. Rikki Spearing.Marland Kitchen She was informed of the importance of frequent follow up visits to maximize her success with intensive lifestyle modifications for her multiple health conditions.  Attestation Statement   Reviewed by clinician on day of visit: allergies, medications, problem list, medical history, surgical history, family history, social history, and previous encounter notes.     Worthy Rancher, MD

## 2023-09-25 DIAGNOSIS — M4316 Spondylolisthesis, lumbar region: Secondary | ICD-10-CM | POA: Diagnosis not present

## 2023-10-07 DIAGNOSIS — M4316 Spondylolisthesis, lumbar region: Secondary | ICD-10-CM | POA: Diagnosis not present

## 2023-10-16 DIAGNOSIS — M4316 Spondylolisthesis, lumbar region: Secondary | ICD-10-CM | POA: Diagnosis not present

## 2023-10-21 ENCOUNTER — Ambulatory Visit (INDEPENDENT_AMBULATORY_CARE_PROVIDER_SITE_OTHER): Payer: Medicare Other | Admitting: Internal Medicine

## 2023-10-21 ENCOUNTER — Encounter (INDEPENDENT_AMBULATORY_CARE_PROVIDER_SITE_OTHER): Payer: Self-pay | Admitting: Internal Medicine

## 2023-10-21 VITALS — BP 136/69 | HR 88 | Temp 98.6°F | Ht 66.0 in | Wt 192.0 lb

## 2023-10-21 DIAGNOSIS — E669 Obesity, unspecified: Secondary | ICD-10-CM

## 2023-10-21 DIAGNOSIS — M549 Dorsalgia, unspecified: Secondary | ICD-10-CM

## 2023-10-21 DIAGNOSIS — G8929 Other chronic pain: Secondary | ICD-10-CM | POA: Diagnosis not present

## 2023-10-21 DIAGNOSIS — I10 Essential (primary) hypertension: Secondary | ICD-10-CM

## 2023-10-21 DIAGNOSIS — R7303 Prediabetes: Secondary | ICD-10-CM

## 2023-10-21 DIAGNOSIS — Z6835 Body mass index (BMI) 35.0-35.9, adult: Secondary | ICD-10-CM

## 2023-10-21 MED ORDER — ZONISAMIDE 25 MG PO CAPS: 25.0000 mg | ORAL_CAPSULE | Freq: Every day | ORAL | 0 refills | Status: DC

## 2023-10-21 NOTE — Progress Notes (Signed)
 Office: [phone]  /  Fax: [phone]  Weight Summary And Biometrics  Vitals Temp: 98.6 F (37 C) BP: 136/69 Pulse Rate: 88 SpO2: 99 %   Anthropometric Measurements Height: 5\' 6"  (1.676 m) Weight: 192 lb (87.1 kg) BMI (Calculated): 31 Weight at Last Visit: 193 lb Weight Lost Since Last Visit: 1 lb Weight Gained Since Last Visit: 0 Starting Weight: 215 lb Total Weight Loss (lbs): 21 lb (9.526 kg) Peak Weight: 217 lb   Body Composition  Body Fat %: 44.3 % Fat Mass (lbs): 85.2 lbs Muscle Mass (lbs): 101.6 lbs Total Body Water (lbs): 72.4 lbs Visceral Fat Rating : 13    No data recorded Today's Visit #: 20  Starting Date: 05/11/22   Subjective   Chief Complaint: Obesity  Interval History Discussed the use of AI scribe software for clinical note transcription with the patient, who gave verbal consent to proceed.  History of Present Illness Janice Hutchinson is a 73 year old female who presents for medical weight management.  She has gained one pound since her last visit. She adheres to a category two meal plan about ninety percent of the time, tracks her calories, eats more whole foods, and maintains adequate hydration. She exercises three days a week for about sixty minutes, including physical therapy, focusing on strengthening and cardio. She reports adequate sleep but increased stress levels.  Since starting zonisamide, her appetite has decreased, and she journals her intake, averaging about 729 calories per day. She feels full and not hungry, even though her intake is below her metabolic needs. She has lost two and a half pounds of fat and gained two pounds of muscle since starting her exercise regimen. She is trying to increase her protein intake, aiming for 90 to 120 grams per day, but finds it challenging. She consumes chicken, fish, [place] yogurt, and cottage cheese occasionally, and uses protein smoothies and shakes. No significant side effects from  zonisamide, aside from a sense of fullness. No increased drowsiness or changes in sleep pattern attributed to the medication.  She mentions increased stress due to family circumstances, including the recent passing of her husband's brother and the responsibilities her husband has taken on for his 16 year old mother. This stress has impacted her eating habits, as she is often not hungry and skips meals. Despite these challenges, she is trying to maintain her dietary goals and support her husband.    Challenges affecting patient progress: orthopedic problems, medical conditions or chronic pain affecting mobility.    Pharmacotherapy for weight management: She is currently taking Zonisamide (off label use, single agent) with adequate clinical response  and without side effects..   Assessment and Plan   Treatment Plan For Obesity:  Recommended Dietary Goals  [name] is currently in the action stage of change. As such, her goal is to continue weight management plan. She has agreed to: keep a food journal with a target of  1000 calories per day and 90-120 grams of protein per day or 30-40 grams per meal.  Behavioral Health and Counseling  We discussed the following behavioral modification strategies today: increasing lean protein intake to established goals and increase calories to 1000 cal/day .  Additional education and resources provided today: None  Recommended Physical Activity Goals  [name] has been advised to work up to 150 minutes of moderate intensity aerobic activity a week and strengthening exercises 2-3 times per week for cardiovascular health, weight loss maintenance and preservation of muscle mass.   She has  agreed to :  Continue current level of physical activity   Pharmacotherapy  We discussed various medication options to help Bronwen with her weight loss efforts and we both agreed to : adequate clinical response to current dose, continue current regimen  Associated  Conditions Impacted by Obesity Treatment  There are no diagnoses linked to this encounter.  Assessment and Plan Assessment & Plan Obesity   She is undergoing medical weight management and has gained one pound since the last visit. She follows a category two meal plan about 90% of the time, tracks calories, eats more whole foods, and maintains hydration. She exercises three days a week for 60 minutes, including physical therapy, and reports adequate sleep but increased stress. She was started on zonisamide to assist with appetite and weight management. The medication has helped reduce her appetite, but she is consuming only about 729 calories per day, which is too low given her metabolic rate of approximately 1485 calories. Emphasized the importance of not restricting calories too much to avoid slowing down metabolism and recommended increasing calorie intake to at least 1000 calories per day, with a focus on protein intake. Discussed the risks of compounded weight management medications, including potential contamination and inconsistent dosing, and advised against their use.   - Increase calorie intake to at least 1000 calories per day.   - Ensure intake of 90 grams of protein per day.   - Continue zonisamide for appetite and weight management.   - Refill zonisamide prescription for 90 days.   - Avoid compounded weight management medications.    Prediabetes   Her A1c is 4.6, indicating good control of blood glucose levels. The focus remains on weight management and dietary modifications to prevent progression to diabetes.   - Continue current dietary and exercise regimen to maintain blood glucose control.    Hypertension   Her blood pressure is well-controlled. Emphasized the importance of maintaining cardiovascular health, which is supported by the current medication regimen.   - Continue current antihypertensive medication regimen.    Chronic Back Pain   She reports improvement in back pain  with physical therapy but still experiences numbness and pain in the thighs and knees. Plans to continue physical therapy and avoid surgical intervention unless symptoms worsen significantly.  - Continue physical therapy for back pain management.   - Follow up with neurosurgeon as scheduled.      Objective   Physical Exam:  Blood pressure 136/69, pulse 88, temperature 98.6 F (37 C), height 5\' 6"  (1.676 m), weight 192 lb (87.1 kg), SpO2 99%. Body mass index is 30.99 kg/m.  General: She is overweight, cooperative, alert, well developed, and in no acute distress. PSYCH: Has normal mood, affect and thought process.   HEENT: EOMI, sclerae are anicteric. Lungs: Normal breathing effort, no conversational dyspnea. Extremities: No edema.  Neurologic: No gross sensory or motor deficits. No tremors or fasciculations noted.    Diagnostic Data Reviewed:  BMET    Component Value Date/Time   NA 140 02/03/2023 0000   K 4.6 02/03/2023 0000   CL 104 02/03/2023 0000   CO2 29 (A) 02/03/2023 0000   GLUCOSE 84 10/01/2022 1045   BUN 18 02/03/2023 0000   CREATININE 1.0 02/03/2023 0000   CREATININE 1.05 (H) 10/01/2022 1045   CALCIUM 10.5 02/03/2023 0000   GFRNONAA 57 (L) 10/01/2022 1045   GFRAA >60 03/17/2017 1048   Lab Results  Component Value Date   HGBA1C 4.6 (L) 08/25/2023   HGBA1C 4.7 (L)  05/29/2022   Lab Results  Component Value Date   INSULIN 15.0 12/01/2022   INSULIN 12.1 05/01/2022   Lab Results  Component Value Date   TSH 1.05 02/03/2023   CBC    Component Value Date/Time   WBC 4.8 10/01/2022 1045   RBC 5.55 (H) 10/01/2022 1045   HGB 14.4 10/01/2022 1045   HGB 15.3 05/29/2022 1600   HCT 46.3 (H) 10/01/2022 1045   HCT 47.4 (H) 05/29/2022 1600   PLT 204 10/01/2022 1045   MCV 83.4 10/01/2022 1045   MCV 80 05/29/2022 1600   MCH 25.9 (L) 10/01/2022 1045   MCHC 31.1 10/01/2022 1045   RDW 14.8 10/01/2022 1045   RDW 14.3 05/29/2022 1600   Iron Studies No results  found for: "IRON", "TIBC", "FERRITIN", "IRONPCTSAT" Lipid Panel     Component Value Date/Time   CHOL 177 02/03/2023 0000   CHOL 181 05/01/2022 1222   TRIG 89 02/03/2023 0000   HDL 65 02/03/2023 0000   HDL 52 05/01/2022 1222   CHOLHDL 4 05/04/2014 1005   VLDL 15.0 05/04/2014 1005   LDLCALC 94 02/03/2023 0000   LDLCALC 112 (H) 05/01/2022 1222   Hepatic Function Panel     Component Value Date/Time   PROT 7.3 10/01/2022 1045   PROT 7.4 05/29/2022 1600   ALBUMIN 4.1 02/03/2023 0000   ALBUMIN 4.2 05/29/2022 1600   AST 16 02/03/2023 0000   ALT 23 02/03/2023 0000   ALKPHOS 92 02/03/2023 0000   BILITOT 1.2 10/01/2022 1045   BILITOT 0.8 05/29/2022 1600   BILIDIR 0.3 (H) 10/01/2022 1045   IBILI 0.9 10/01/2022 1045      Component Value Date/Time   TSH 1.05 02/03/2023 0000   TSH 0.58 05/04/2014 1005   Nutritional Lab Results  Component Value Date   VD25OH 45.5 12/01/2022   VD25OH 14.1 (L) 05/29/2022   VD25OH 28.71 (L) 05/04/2014    Medications: Outpatient Encounter Medications as of 10/21/2023  Medication Sig   amLODipine (NORVASC) 5 MG tablet Take 5 mg by mouth in the morning and at bedtime.   Artificial Tear Solution (SOOTHE XP OP) Place 1-2 drops into both eyes 3 (three) times daily as needed (dry/irritated eyes.).   atorvastatin (LIPITOR) 10 MG tablet Take 10 mg by mouth every Monday.   b complex vitamins tablet Take 1 tablet by mouth in the morning.   bisoprolol (ZEBETA) 10 MG tablet Take 10 mg by mouth in the morning.   furosemide (LASIX) 20 MG tablet    losartan (COZAAR) 100 MG tablet Take 100 mg by mouth in the morning.   zonisamide (ZONEGRAN) 25 MG capsule Take 1 capsule (25 mg total) by mouth daily.   Facility-Administered Encounter Medications as of 10/21/2023  Medication   Spy Agent Chilton Si / Firefly [employer]ime     Follow-Up   No follow-ups on file.Marland Kitchen She was informed of the importance of frequent follow up visits to maximize her success with intensive lifestyle  modifications for her multiple health conditions.  Attestation Statement   Reviewed by clinician on day of visit: allergies, medications, problem list, medical history, surgical history, family history, social history, and previous encounter notes.     Worthy Rancher, MD

## 2023-10-22 DIAGNOSIS — M4316 Spondylolisthesis, lumbar region: Secondary | ICD-10-CM | POA: Diagnosis not present

## 2023-10-23 DIAGNOSIS — M4316 Spondylolisthesis, lumbar region: Secondary | ICD-10-CM | POA: Diagnosis not present

## 2023-10-29 DIAGNOSIS — M4316 Spondylolisthesis, lumbar region: Secondary | ICD-10-CM | POA: Diagnosis not present

## 2023-11-06 DIAGNOSIS — M4316 Spondylolisthesis, lumbar region: Secondary | ICD-10-CM | POA: Diagnosis not present

## 2023-11-12 DIAGNOSIS — M4316 Spondylolisthesis, lumbar region: Secondary | ICD-10-CM | POA: Diagnosis not present

## 2023-11-19 DIAGNOSIS — R252 Cramp and spasm: Secondary | ICD-10-CM | POA: Diagnosis not present

## 2023-11-19 DIAGNOSIS — M48062 Spinal stenosis, lumbar region with neurogenic claudication: Secondary | ICD-10-CM | POA: Diagnosis not present

## 2023-11-19 DIAGNOSIS — Z79899 Other long term (current) drug therapy: Secondary | ICD-10-CM | POA: Diagnosis not present

## 2023-11-19 DIAGNOSIS — E785 Hyperlipidemia, unspecified: Secondary | ICD-10-CM | POA: Diagnosis not present

## 2023-11-19 DIAGNOSIS — I10 Essential (primary) hypertension: Secondary | ICD-10-CM | POA: Diagnosis not present

## 2023-11-23 DIAGNOSIS — M4316 Spondylolisthesis, lumbar region: Secondary | ICD-10-CM | POA: Diagnosis not present

## 2023-11-24 ENCOUNTER — Observation Stay (HOSPITAL_COMMUNITY)

## 2023-11-24 ENCOUNTER — Ambulatory Visit (INDEPENDENT_AMBULATORY_CARE_PROVIDER_SITE_OTHER): Admitting: Internal Medicine

## 2023-11-24 ENCOUNTER — Emergency Department (HOSPITAL_COMMUNITY)

## 2023-11-24 ENCOUNTER — Observation Stay (HOSPITAL_COMMUNITY)
Admission: EM | Admit: 2023-11-24 | Discharge: 2023-11-25 | Disposition: A | Discharge status: Home / Self Care | Attending: Family Medicine | Admitting: Family Medicine

## 2023-11-24 ENCOUNTER — Encounter (HOSPITAL_COMMUNITY): Payer: Self-pay

## 2023-11-24 ENCOUNTER — Other Ambulatory Visit: Payer: Self-pay

## 2023-11-24 ENCOUNTER — Encounter (INDEPENDENT_AMBULATORY_CARE_PROVIDER_SITE_OTHER): Payer: Self-pay

## 2023-11-24 DIAGNOSIS — I672 Cerebral atherosclerosis: Secondary | ICD-10-CM | POA: Diagnosis not present

## 2023-11-24 DIAGNOSIS — E7800 Pure hypercholesterolemia, unspecified: Secondary | ICD-10-CM | POA: Insufficient documentation

## 2023-11-24 DIAGNOSIS — I341 Nonrheumatic mitral (valve) prolapse: Secondary | ICD-10-CM | POA: Insufficient documentation

## 2023-11-24 DIAGNOSIS — G459 Transient cerebral ischemic attack, unspecified: Secondary | ICD-10-CM

## 2023-11-24 DIAGNOSIS — I10 Essential (primary) hypertension: Secondary | ICD-10-CM

## 2023-11-24 DIAGNOSIS — I639 Cerebral infarction, unspecified: Principal | ICD-10-CM

## 2023-11-24 DIAGNOSIS — E669 Obesity, unspecified: Secondary | ICD-10-CM | POA: Diagnosis not present

## 2023-11-24 DIAGNOSIS — E785 Hyperlipidemia, unspecified: Secondary | ICD-10-CM | POA: Diagnosis not present

## 2023-11-24 DIAGNOSIS — R531 Weakness: Principal | ICD-10-CM

## 2023-11-24 DIAGNOSIS — R29704 NIHSS score 4: Secondary | ICD-10-CM

## 2023-11-24 DIAGNOSIS — I6381 Other cerebral infarction due to occlusion or stenosis of small artery: Secondary | ICD-10-CM

## 2023-11-24 DIAGNOSIS — R200 Anesthesia of skin: Secondary | ICD-10-CM | POA: Diagnosis not present

## 2023-11-24 DIAGNOSIS — Z79899 Other long term (current) drug therapy: Secondary | ICD-10-CM | POA: Diagnosis not present

## 2023-11-24 DIAGNOSIS — Z789 Other specified health status: Secondary | ICD-10-CM

## 2023-11-24 DIAGNOSIS — I6521 Occlusion and stenosis of right carotid artery: Secondary | ICD-10-CM | POA: Diagnosis not present

## 2023-11-24 DIAGNOSIS — R17 Unspecified jaundice: Secondary | ICD-10-CM

## 2023-11-24 DIAGNOSIS — R4701 Aphasia: Secondary | ICD-10-CM | POA: Diagnosis not present

## 2023-11-24 LAB — CBC
HCT: 46.1 % — ABNORMAL HIGH (ref 36.0–46.0)
Hemoglobin: 14.8 g/dL (ref 12.0–15.0)
MCH: 26.3 pg (ref 26.0–34.0)
MCHC: 32.1 g/dL (ref 30.0–36.0)
MCV: 82 fL (ref 80.0–100.0)
Platelets: 228 10*3/uL (ref 150–400)
RBC: 5.62 MIL/uL — ABNORMAL HIGH (ref 3.87–5.11)
RDW: 15.3 % (ref 11.5–15.5)
WBC: 5.2 10*3/uL (ref 4.0–10.5)
nRBC: 0 % (ref 0.0–0.2)

## 2023-11-24 LAB — COMPREHENSIVE METABOLIC PANEL WITH GFR
ALT: 21 U/L (ref 0–44)
AST: 18 U/L (ref 15–41)
Albumin: 3.7 g/dL (ref 3.5–5.0)
Alkaline Phosphatase: 81 U/L (ref 38–126)
Anion gap: 11 (ref 5–15)
BUN: 15 mg/dL (ref 8–23)
CO2: 22 mmol/L (ref 22–32)
Calcium: 9.7 mg/dL (ref 8.9–10.3)
Chloride: 106 mmol/L (ref 98–111)
Creatinine, Ser: 0.98 mg/dL (ref 0.44–1.00)
GFR, Estimated: >60 mL/min (ref >60)
Glucose, Bld: 98 mg/dL (ref 70–99)
Potassium: 3.6 mmol/L (ref 3.5–5.1)
Sodium: 139 mmol/L (ref 135–145)
Total Bilirubin: 1.6 mg/dL — ABNORMAL HIGH (ref 0.0–1.2)
Total Protein: 7.1 g/dL (ref 6.5–8.1)

## 2023-11-24 LAB — LIPID PANEL
Cholesterol: 176 mg/dL (ref 0–200)
HDL: 64 mg/dL (ref >40)
LDL Cholesterol: 98 mg/dL (ref 0–99)
Total CHOL/HDL Ratio: 2.8 ratio
Triglycerides: 71 mg/dL (ref <150)
VLDL: 14 mg/dL (ref 0–40)

## 2023-11-24 LAB — CBG MONITORING, ED: Glucose-Capillary: 78 mg/dL (ref 70–99)

## 2023-11-24 LAB — PROTIME-INR
INR: 1 (ref 0.8–1.2)
Prothrombin Time: 13.5 s (ref 11.4–15.2)

## 2023-11-24 LAB — ECHOCARDIOGRAM COMPLETE
Height: 66 in
S' Lateral: 3.3 cm
Weight: 3072.33 [oz_av]

## 2023-11-24 LAB — BILIRUBIN, FRACTIONATED(TOT/DIR/INDIR)
Bilirubin, Direct: 0.2 mg/dL (ref 0.0–0.2)
Indirect Bilirubin: 1 mg/dL — ABNORMAL HIGH (ref 0.3–0.9)
Total Bilirubin: 1.2 mg/dL (ref 0.0–1.2)

## 2023-11-24 LAB — I-STAT CHEM 8, ED
BUN: 17 mg/dL (ref 8–23)
Calcium, Ion: 1.16 mmol/L (ref 1.15–1.40)
Chloride: 110 mmol/L (ref 98–111)
Creatinine, Ser: 1 mg/dL (ref 0.44–1.00)
Glucose, Bld: 98 mg/dL (ref 70–99)
HCT: 45 % (ref 36.0–46.0)
Hemoglobin: 15.3 g/dL — ABNORMAL HIGH (ref 12.0–15.0)
Potassium: 3.6 mmol/L (ref 3.5–5.1)
Sodium: 140 mmol/L (ref 135–145)
TCO2: 21 mmol/L — ABNORMAL LOW (ref 22–32)

## 2023-11-24 LAB — DIFFERENTIAL
Abs Immature Granulocytes: 0.02 10*3/uL (ref 0.00–0.07)
Basophils Absolute: 0 10*3/uL (ref 0.0–0.1)
Basophils Relative: 1 %
Eosinophils Absolute: 0.1 10*3/uL (ref 0.0–0.5)
Eosinophils Relative: 2 %
Immature Granulocytes: 0 %
Lymphocytes Relative: 50 %
Lymphs Abs: 2.6 10*3/uL (ref 0.7–4.0)
Monocytes Absolute: 0.5 10*3/uL (ref 0.1–1.0)
Monocytes Relative: 10 %
Neutro Abs: 1.9 10*3/uL (ref 1.7–7.7)
Neutrophils Relative %: 37 %

## 2023-11-24 LAB — ETHANOL: Alcohol, Ethyl (B): <15 mg/dL (ref <15)

## 2023-11-24 LAB — APTT: aPTT: 27 s (ref 24–36)

## 2023-11-24 MED ORDER — ACETAMINOPHEN 160 MG/5ML PO SOLN: 650.0000 mg | ORAL | Status: DC | PRN

## 2023-11-24 MED ORDER — CLOPIDOGREL BISULFATE 75 MG PO TABS
75.0000 mg | ORAL_TABLET | Freq: Every day | ORAL | Status: DC
  Administered 2023-11-24 – 2023-11-25 (×2): 75 mg via ORAL
  Filled 2023-11-24 (×2): qty 1

## 2023-11-24 MED ORDER — ACETAMINOPHEN 650 MG RE SUPP: 650.0000 mg | RECTAL | Status: DC | PRN

## 2023-11-24 MED ORDER — ATORVASTATIN CALCIUM 40 MG PO TABS
40.0000 mg | ORAL_TABLET | Freq: Every day | ORAL | Status: DC
  Administered 2023-11-24 – 2023-11-25 (×2): 40 mg via ORAL
  Filled 2023-11-24 (×2): qty 1

## 2023-11-24 MED ORDER — SODIUM CHLORIDE 0.9% FLUSH
3.0000 mL | Freq: Once | INTRAVENOUS | Status: AC
  Administered 2023-11-24: 3 mL via INTRAVENOUS

## 2023-11-24 MED ORDER — ACETAMINOPHEN 325 MG PO TABS
650.0000 mg | ORAL_TABLET | Freq: Four times a day (QID) | ORAL | Status: DC | PRN
  Administered 2023-11-24: 650 mg via ORAL
  Filled 2023-11-24: qty 2

## 2023-11-24 MED ORDER — SENNOSIDES-DOCUSATE SODIUM 8.6-50 MG PO TABS: 1.0000 | ORAL_TABLET | Freq: Every evening | ORAL | Status: DC | PRN

## 2023-11-24 MED ORDER — ENOXAPARIN SODIUM 40 MG/0.4ML IJ SOSY: 40.0000 mg | PREFILLED_SYRINGE | INTRAMUSCULAR | Status: DC

## 2023-11-24 MED ORDER — SODIUM CHLORIDE 0.9 % IV SOLN: INTRAVENOUS | Status: DC

## 2023-11-24 MED ORDER — ASPIRIN 81 MG PO TBEC
81.0000 mg | DELAYED_RELEASE_TABLET | Freq: Every day | ORAL | Status: DC
  Administered 2023-11-24 – 2023-11-25 (×2): 81 mg via ORAL
  Filled 2023-11-24 (×2): qty 1

## 2023-11-24 MED ORDER — IOHEXOL 350 MG/ML SOLN
100.0000 mL | Freq: Once | INTRAVENOUS | Status: AC | PRN
  Administered 2023-11-24: 100 mL via INTRAVENOUS

## 2023-11-24 MED ORDER — ACETAMINOPHEN 325 MG PO TABS: 650.0000 mg | ORAL_TABLET | ORAL | Status: DC | PRN

## 2023-11-24 MED ORDER — ENOXAPARIN SODIUM 40 MG/0.4ML IJ SOSY
40.0000 mg | PREFILLED_SYRINGE | INTRAMUSCULAR | Status: DC
  Administered 2023-11-24: 40 mg via SUBCUTANEOUS
  Filled 2023-11-24: qty 0.4

## 2023-11-24 NOTE — Assessment & Plan Note (Signed)
+   dysarthria. Asymptomatic at this time, return to baseline with no residual deficits on my exam. At this time, no concern for large vessel occlusion. CT head negative in ED. Likely TIA vs CVA, will know more definitively with return of MRI brain. - Neurology following - ASA 81 mg and Plavix 75 mg daily - MRI brain ordered - Labs: Lipid panel, A1c - Keep cardiac telemetry - Neurochecks q4h - Echocardiogram ordered

## 2023-11-24 NOTE — ED Notes (Addendum)
 Janice Hutchinson

## 2023-11-24 NOTE — ED Triage Notes (Signed)
 Pt arrived from home via POV [facility] 2300 11/23/2023. Pt noted to have aphasia and delayed speech response, ataxia left side, left side weakness, and left side numbness. Code stroke called

## 2023-11-24 NOTE — Evaluation (Signed)
 Physical Therapy Evaluation Patient Details Name: Naleigh Bookhart MRN: [id number] DOB: [birth year] Today's Date: 11/24/2023  History of Present Illness  Patient is a 73 y/o female admitted 11/24/23 with L side numbness and speech difficulty concern for CVA.  MRI negative for acute abnormality.  PMH positive for HTN, prediabetes, HLD, and back issues for which just finished with outpatient PT.  Clinical Impression  Patient mobilizing a little hesitantly initially with bedrest since she has been here.  She was initially holding the IV pole though progressed during session to walking unaided and completing DGI with score 19/24 (scores < 19 demonstrate a fall risk).  She was encouraged to mobilize as much as nursing will allow and to move carefully once back home.  Feel no further skilled PT needs though will benefit from nursing for frequent mobility while in the acute setting.  PT will sign off.         If plan is discharge home, recommend the following: Assist for transportation   Can travel by private vehicle        Equipment Recommendations None recommended by PT  Recommendations for Other Services       Functional Status Assessment Patient has had a recent decline in their functional status and demonstrates the ability to make significant improvements in function in a reasonable and predictable amount of time.     Precautions / Restrictions Precautions Precautions: None      Mobility  Bed Mobility Overal bed mobility: Modified Independent                  Transfers Overall transfer level: Needs assistance   Transfers: Sit to/from Stand Sit to Stand: Supervision           General transfer comment: for safety    Ambulation/Gait Ambulation/Gait assistance: Supervision, Contact guard assist Gait Distance (Feet): 225 Feet Assistive device: None Gait Pattern/deviations: Step-through pattern, Decreased stride length, Drifts right/left, Wide base of support        General Gait Details: mild veering noted with head turns, some imbalance initially pt wanting to hold IV pole though encouraged to try without  Stairs Stairs: Yes Stairs assistance: Supervision Stair Management: Two rails, Alternating pattern, Forwards Number of Stairs: 6 General stair comments: used both rails at first, then when coming back over used rails only very little  Wheelchair Mobility     Tilt Bed    Modified Rankin (Stroke Patients Only) Modified Rankin (Stroke Patients Only) Pre-Morbid Rankin Score: No symptoms Modified Rankin: Moderate disability     Balance Overall balance assessment: Needs assistance   Sitting balance-Leahy Scale: Good     Standing balance support: During functional activity, No upper extremity supported Standing balance-Leahy Scale: Good                   Standardized Balance Assessment Standardized Balance Assessment : Dynamic Gait Index   Dynamic Gait Index Level Surface: Mild Impairment Change in Gait Speed: Mild Impairment Gait with Horizontal Head Turns: Mild Impairment Gait with Vertical Head Turns: Mild Impairment Gait and Pivot Turn: Normal Step Over Obstacle: Normal Step Around Obstacles: Normal Steps: Mild Impairment Total Score: 19       Pertinent Vitals/Pain Pain Assessment Pain Assessment: No/denies pain    Home Living Family/patient expects to be discharged to:: Private residence Living Arrangements: Spouse/significant other Available Help at Discharge: Family Type of Home: House Home Access: Stairs to enter Entrance Stairs-Rails: Doctor, general practice of Steps: "a few"   Home  Layout: One level Home Equipment: None      Prior Function Prior Level of Function : Independent/Modified Independent             Mobility Comments: 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       Extremity/Trunk Assessment   Upper Extremity Assessment Upper  Extremity Assessment: LUE deficits/detail LUE Deficits / Details: mildly decreased grip and decreased strength with elbow extension compared to R; reports tingling/numbness more than baseline LUE Sensation: decreased light touch    Lower Extremity Assessment Lower Extremity Assessment: LLE deficits/detail LLE Deficits / Details: AROM WFL, strength hip flexion 4-/5, knee extension 4/5       Communication   Communication Communication: No apparent difficulties    Cognition Arousal: Alert Behavior During Therapy: WFL for tasks assessed/performed   PT - Cognitive impairments: No apparent impairments                         Following commands: Intact       Cueing       General Comments General comments (skin integrity, edema, etc.): encouraged to mobilize as much as nursing will allow as feels she is not quite right due to bedrest since she has been here    Exercises     Assessment/Plan    PT Assessment Patient does not need any further PT services  PT Problem List         PT Treatment Interventions      PT Goals (Current goals can be found in the Care Plan section)  Acute Rehab PT Goals PT Goal Formulation: All assessment and education complete, DC therapy    Frequency       Co-evaluation               AM-PAC PT "6 Clicks" Mobility  Outcome Measure Help needed turning from your back to your side while in a flat bed without using bedrails?: None Help needed moving from lying on your back to sitting on the side of a flat bed without using bedrails?: None Help needed moving to and from a bed to a chair (including a wheelchair)?: None Help needed standing up from a chair using your arms (e.g., wheelchair or bedside chair)?: None Help needed to walk in hospital room?: None Help needed climbing 3-5 steps with a railing? : None 6 Click Score: 24    End of Session Equipment Utilized During Treatment: Gait belt Activity Tolerance: Patient tolerated  treatment well Patient left: in bed;with family/visitor present (going for procedure)   PT Visit Diagnosis: Other abnormalities of gait and mobility (R26.89)    Time: 0981-1914 PT Time Calculation (min) (ACUTE ONLY): 17 min   Charges:   PT Evaluation $PT Eval Low Complexity: 1 Low   PT General Charges $$ ACUTE PT VISIT: 1 Visit         Abigail Hoff, PT Acute Rehabilitation Services [phone] 11/24/2023   Marley Simmers 11/24/2023, 4:09 PM

## 2023-11-24 NOTE — Code Documentation (Signed)
 Stroke Response Nurse Documentation Code Documentation  Janice Hutchinson is a 73 y.o. female arriving to [facility]  via [employer] on 4/29 with past medical hx of HTN, fibromyalgia, GERD. On No antithrombotic. Code stroke was activated by ED.   Patient from home where she was LKW at 2300 and now complaining of left sided weakness and numbness.   Stroke team at the bedside on patient arrival. Labs drawn and patient cleared for CT by Dr. Candelaria Chaco. Patient to CT with team. NIHSS 3, see documentation for details and code stroke times. Patient with left limb ataxia, left decreased sensation, and dysarthria  on exam. The following imaging was completed:  CT Head, CTA, and CTP. Patient is not a candidate for IV Thrombolytic due to outside of window. Patient is not a candidate for IR due to No LVO.   Care Plan: neuro checks q2 hours.   Process Delays Noted: None  Bedside handoff with ED RN Kayleen Party.    Dodson Freestone  Rapid Response RN

## 2023-11-24 NOTE — ED Provider Notes (Signed)
 [place] EMERGENCY DEPARTMENT AT [facility] Provider Note   CSN: [id number] Arrival date & time: 11/24/23  7829  An emergency department physician performed an initial assessment on this suspected stroke patient at 0620.  History {Add pertinent medical, surgical, social history, OB history to HPI:1} Chief Complaint  Patient presents with   Code Stroke    Natazia Cante is a 73 y.o. female.  The history is provided by the patient.  She has a history of hypertension, sickle cell trait, mitral valve prolapse and states that she woke up at about 4:30 AM with numbness in the left side of her body.  There was no headache, vomiting.  She also apparently was having some difficulty speaking.  She was last known normal at 11 PM when she went to sleep.   Home Medications Prior to Admission medications   Medication Sig Start Date End Date Taking? Authorizing Provider  amLODipine  (NORVASC ) 5 MG tablet Take 5 mg by mouth in the morning and at bedtime.    [provider]  Artificial Tear Solution (SOOTHE XP OP) Place 1-2 drops into both eyes 3 (three) times daily as needed (dry/irritated eyes.).    [provider]  atorvastatin (LIPITOR) 10 MG tablet Take 10 mg by mouth every Monday.    [provider]  b complex vitamins tablet Take 1 tablet by mouth in the morning.    [provider]  bisoprolol  (ZEBETA ) 10 MG tablet Take by mouth in the morning.    [provider]  losartan (COZAAR) 100 MG tablet Take 100 mg by mouth in the morning. 05/12/22   [provider]  zonisamide  (ZONEGRAN ) 25 MG capsule Take 1 capsule (25 mg total) by mouth daily. 10/21/23   Ladd Picker, MD      Allergies    Tape and Metformin and related    Review of Systems   Review of Systems  All other systems reviewed and are negative.   Physical Exam Updated Vital Signs BP (!) 160/86 (BP Location: Left Arm)   Pulse 64   Temp 98 F (36.7 C)    Resp 14   Ht 5\' 6"  (1.676 m)   Wt 87.1 kg   SpO2 97%   BMI 30.99 kg/m  Physical Exam Vitals and nursing note reviewed.   73 year old female, resting comfortably and in no acute distress. Vital signs are significant for elevated blood pressure. Oxygen  saturation is 100%, which is normal. Head is normocephalic and atraumatic. PERRLA, EOMI.  There is a mild left central facial droop.  Tongue protrudes slightly to the left of midline. Neck is nontender and supple without adenopathy or JVD.  There are no carotid bruits. Lungs are clear without rales, wheezes, or rhonchi. Chest is nontender. Heart has regular rate and rhythm without murmur. Abdomen is soft, flat, nontender. Extremities have no cyanosis or edema. Skin is warm and dry without rash. Neurologic: Awake and alert, slow to answer questions and only whispers response.  Cranial nerves are significant for left central facial droop and tongue protruding to the left as noted above.  There is mild left arm and leg weakness with strength 4+/5.  There is no pronator drift.  Sensation is normal.  There is mild to moderate ataxia on the left with finger-nose testing, none on the right.  ED Results / Procedures / Treatments   Labs (all labs ordered are listed, but only abnormal results are displayed) Labs Reviewed  CBC - Abnormal; Notable  for the following components:      Result Value   RBC 5.62 (*)    HCT 46.1 (*)    All other components within normal limits  I-STAT CHEM 8, ED - Abnormal; Notable for the following components:   TCO2 21 (*)    Hemoglobin 15.3 (*)    All other components within normal limits  PROTIME-INR  APTT  DIFFERENTIAL  COMPREHENSIVE METABOLIC PANEL WITH GFR  ETHANOL  CBG MONITORING, ED    EKG EKG Interpretation Date/Time:  Tuesday November 24 2023 06:17:10 EDT Ventricular Rate:  60 PR Interval:  136 QRS Duration:  76 QT Interval:  452 QTC Calculation: 452 R Axis:   57  Text Interpretation: Normal sinus  rhythm Nonspecific ST abnormality Abnormal ECG When compared with ECG of 24-Nov-2023 06:16, No significant change was found Confirmed by Alissa April ([id number]) on 11/24/2023 6:46:30 AM  Radiology CT ANGIO HEAD NECK W WO CM W PERF (CODE STROKE) Result Date: 11/24/2023 CLINICAL DATA:  73 year old female code stroke presentation, reportedly left side deficit and aphasia. EXAM: CT ANGIOGRAPHY HEAD AND NECK CT PERFUSION BRAIN TECHNIQUE: Multidetector CT imaging of the head and neck was performed using the standard protocol during bolus administration of intravenous contrast. Multiplanar CT image reconstructions and MIPs were obtained to evaluate the vascular anatomy. Carotid stenosis measurements (when applicable) are obtained utilizing NASCET criteria, using the distal internal carotid diameter as the denominator. Multiphase CT imaging of the brain was performed following IV bolus contrast injection. Subsequent parametric perfusion maps were calculated using RAPID software. RADIATION DOSE REDUCTION: This exam was performed according to the departmental dose-optimization program which includes automated exposure control, adjustment of the mA and/or kV according to patient size and/or use of iterative reconstruction technique. CONTRAST:  OMNIPAQUE IOHEXOL 350 MG/ML SOLN COMPARISON:  Head CT 0625 hours today. FINDINGS: CT Brain Perfusion Findings: ASPECTS: 10 CBF (<30%) Volume: Zero, Perfusion (Tmax>6.0s) volume: Zero, and no abnormal CTP parameter is detected. Mismatch Volume: Not applicable Infarction Location:Not applicable CTA NECK Skeleton: Lower cervical spine disc and endplate degeneration. Left TMJ degeneration. No acute osseous abnormality identified. Upper chest: Atelectasis, otherwise negative. Other neck: Partial atrophy of the right submandibular gland, otherwise negative. Aortic arch: 3 vessel arch.  Calcified aortic atherosclerosis. Right carotid system: Mild brachiocephalic soft and calcified plaque  without stenosis. Suboptimal right CCA origin detail due to dense right subclavian venous contrast streak artifact, but the visible right CCA appears normal. Negative right carotid bifurcation. Tortuous right ICA below the skull base with a mildly beaded and kinked appearance, mild fusiform enlargement of 6-7 mm. No plaque or stenosis. Left carotid system: Negative aside from mild tortuosity. Mildly ectatic distal left ICA. Vertebral arteries: Proximal right subclavian artery appears to remain normal. Normal right vertebral origin. Non dominant right vertebral artery appears patent to the skull base with V3 segment tortuosity and mildly beaded appearance. No convincing atherosclerosis. Proximal left subclavian artery mild tortuosity and plaque without stenosis. Normal left vertebral artery origin. Dominant left vertebral artery is patent and within normal limits to the skull base. CTA HEAD Posterior circulation: Non dominant right vertebral artery terminates in PICA. Left vertebral supplies the basilar without stenosis. Left PICA is patent. Basilar artery is patent without stenosis. Patent basilar tip, SCA origins. Fetal type bilateral PCA origins. Bilateral PCA branches are patent. Mild left P2 segment irregularity. Anterior circulation: Both ICA siphons are patent. The left is mildly tortuous without plaque or stenosis. Mild cavernous right ICA calcified plaque without stenosis. Both  posterior communicating artery origins are normal. Patent carotid termini, MCA and ACA origins. Tortuous A1 segments. Diminutive or absent anterior communicating artery. Bilateral ACA branches are within normal limits. Left MCA M1 segment and bifurcation are patent without stenosis. Right MCA M1 segment and bifurcation are patent without stenosis. Bilateral MCA branches are within normal limits. Venous sinuses: Early contrast timing, not well evaluated. Anatomic variants: Dominant left vertebral artery supplies the basilar, the right  terminates in PICA. Fetal type PCA origins. Review of the MIP images confirms the above findings IMPRESSION: 1. Negative for large vessel occlusion, and Negative CT Perfusion. 2. Minimal atherosclerosis in the head and neck but Positive for vessel tortuosity, ectasia, and mildly beaded appearance of the distal Right ICA and the Right vertebral V3 segment suspicious for Fibromuscular Dysplasia (FMD). 3.  Aortic Atherosclerosis (ICD10-I70.0). These results were communicated to Dr. Arora at 7:00 am on 11/24/2023 by text page via the [facility] messaging system. Electronically Signed   By: Marlise Simpers M.D.   On: 11/24/2023 07:01   CT HEAD CODE STROKE WO CONTRAST Result Date: 11/24/2023 CLINICAL DATA:  Code stroke. 73 year old female with left side deficit, aphasia. EXAM: CT HEAD WITHOUT CONTRAST TECHNIQUE: Contiguous axial images were obtained from the base of the skull through the vertex without intravenous contrast. RADIATION DOSE REDUCTION: This exam was performed according to the departmental dose-optimization program which includes automated exposure control, adjustment of the mA and/or kV according to patient size and/or use of iterative reconstruction technique. COMPARISON:  None Available. FINDINGS: Brain: Cerebral volume is within normal limits for age. No midline shift, ventriculomegaly, mass effect, evidence of mass lesion, intracranial hemorrhage or evidence of cortically based acute infarction. Minimal to mild for age scattered white matter hypodensity including in the anterior right frontal lobe. No cortical encephalomalacia identified. And otherwise maintained gray-white differentiation. Vascular: No suspicious intracranial vascular hyperdensity. Skull: Intact, negative. Sinuses/Orbits: Visualized paranasal sinuses and mastoids are clear. Other: No gaze deviation. No acute orbit or scalp soft tissue finding. ASPECTS [facility] Stroke Program Early CT Score) Total score (0-10 with 10 being normal): 10 IMPRESSION:  1. Negative for age noncontrast CT appearance of the brain; minimal to mild white matter changes. ASPECTS 10. 2. These results were communicated to Dr. Arora at 6:28 am on 11/24/2023 by text page via the [facility] messaging system. Electronically Signed   By: Marlise Simpers M.D.   On: 11/24/2023 06:28    Procedures Procedures  Cardiac monitor showed normal sinus rhythm, per my interpretation.  Medications Ordered in ED Medications  sodium chloride  flush (NS) 0.9 % injection 3 mL (has no administration in time range)  iohexol (OMNIPAQUE) 350 MG/ML injection 100 mL (100 mLs Intravenous Contrast Given 11/24/23 1478)    ED Course/ Medical Decision Making/ A&P   {   Click here for ABCD2, HEART and other calculatorsREFRESH Note before signing :1}                              Medical Decision Making Amount and/or Complexity of Data Reviewed Labs: ordered. Radiology: ordered.  Risk Decision regarding hospitalization.   Right hemisphere stroke.  Unfortunately, she is outside the window for thrombolytic therapy.  Code stroke was activated on arrival.  Emergent CT of head shows normal appearance of the brain, ASPECTS 10.  CT angiogram of head and neck have been ordered to look for evidence of large vessel occlusion to see if she would be a candidate for  endovascular therapy.  I have reviewed her electrocardiogram, my interpretation is mild bradycardia and otherwise normal ECG.  I have reviewed her initial laboratory tests and my interpretation is normal CBC, normal i-STAT Chem-8.  I have discussed the case with Dr. Aaron Aas of ***, who agrees to admit the patient.  Comprehensive metabolic panel has come back significant only for mildly elevated total bilirubin of uncertain clinical significance.  CRITICAL CARE Performed by: Alissa April Total critical care time: 40 minutes Critical care time was exclusive of separately billable procedures and treating other patients. Critical care was necessary to treat or  prevent imminent or life-threatening deterioration. Critical care was time spent personally by me on the following activities: development of treatment plan with patient and/or surrogate as well as nursing, discussions with consultants, evaluation of patient's response to treatment, examination of patient, obtaining history from patient or surrogate, ordering and performing treatments and interventions, ordering and review of laboratory studies, ordering and review of radiographic studies, pulse oximetry and re-evaluation of patient's condition.  Final Clinical Impression(s) / ED Diagnoses Final diagnoses:  Acute ischemic stroke [facility])    Rx / DC Orders ED Discharge Orders     None
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
## 2023-11-24 NOTE — Assessment & Plan Note (Addendum)
 Allow for permissive hypertension for next 24-48 hours (only treat if systolic >220) - Hold home antihypertensives for now  Amlodipine  5 mg daily  Losartan 100 mg daily
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
## 2023-11-24 NOTE — ED Notes (Signed)
CBG 78. 

## 2023-11-24 NOTE — ED Notes (Signed)
 Patient transported to MRI

## 2023-11-24 NOTE — Plan of Care (Signed)
 FMTS Brief Progress Note  S: Went to evaluate patient at bedside with Dr. Fernand Howard.  Patient reports weakness is resolved, but does have numbness and reduce sensation of her left face and upper extremity.   O: BP 114/65 (BP Location: Left Arm)   Pulse (!) 56   Temp 97.8 F (36.6 C)   Resp 17   Ht 5\' 6"  (1.676 m)   Wt 87.1 kg   SpO2 99%   BMI 30.99 kg/m    General: NAD, pleasant HEENT: Normocephalic, atraumatic head. Cardio: RRR, no MRG. Cap Refill <2s. Respiratory: CTAB, normal wob on RA Skin: Warm and dry Neuro: CN II: PERRL CN III, IV,VI: EOMI CV V: Reduced sensation on left side of face. CVII: Symmetric smile and brow raise CN VIII: Normal hearing CN IX,X: Symmetric palate raise  CN XI: 5/5 shoulder shrug CN XII: Symmetric tongue protrusion  UE and LE strength 5/5 2+ UE and LE reflexes  Reduced sensation in left UE. Normal sensation of LE bilaterally. No ataxia with finger to nose   A/P: Acute left-sided weakness Resolved.  Residual sensory deficit on left face/upper extremity. - Neurology on board, appreciate recommendations - Continue DAPT - Orders reviewed. Labs for AM ordered, which was adjusted as needed.  - If condition changes, plan includes page family medicine team.   Lavada Porteous, DO 11/24/2023, 9:20 PM PGY-2, [facility] Night Resident  Please page [phone] with questions.

## 2023-11-24 NOTE — Progress Notes (Signed)
 Patient arrived on unit. Patient oriented to unit. Bed locked, alarm on and call bell within reach.

## 2023-11-24 NOTE — ED Notes (Signed)
 Pt ambulatory to and from restroom with steady gait

## 2023-11-24 NOTE — Assessment & Plan Note (Signed)
 Obtain lipid panel - Will likely need high intensity statin; LDL goal <70
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
## 2023-11-24 NOTE — ED Notes (Signed)
 Patient expresses upon assessment numbness in left arm and left leg.  Ataxia in left leg.  Mild to moderate dysarthria noted on occasion.

## 2023-11-24 NOTE — Hospital Course (Signed)
 Janice Hutchinson is a 73 year old female with history of HTN, mitral valve prolapse, obesity, previous, hyperlipidemia who was admitted for stroke rule out in setting of acute left-sided weakness.  Acute left-sided weakness Patient presented with dysarthria, left-sided weakness and numbness/tingling.  Code stroke was called, CT head negative in the ED and MRI brain unremarkable. At time of admission, dysarthria and weakness subsided however patient had residual decreased sensation in left face/left upper extremity. Risk stratification labs ordered and showed***.  Started on DAPT, plan for***.  CTA did show possible FMD, and neurology was consulted who recommended***.  Hypertension Patient with hypertension, on amlodipine  5 mg daily and losartan 100 mg daily.  Allowed for permissive hypertension for the initial 24-48 hours***.  Regimen at discharge was***.  Other chronic conditions were medically managed with home medications and formulary alternatives as necessary (***)  Follow-up recommendations

## 2023-11-24 NOTE — TOC CAGE-AID Note (Signed)
 Transition of Care [facility]) - CAGE-AID Screening   Patient Details  Name: Janice Hutchinson MRN: [id number] Date of Birth: 1950-10-26  Transition of Care [facility]) CM/SW Contact:    Chasmine Lender E Lurine Imel, LCSW Phone Number: 11/24/2023, 3:09 PM   Clinical Narrative: Patient denied substance use.   CAGE-AID Screening:    Have You Ever Felt You Ought to Cut Down on Your Drinking or Drug Use?: No Have People Annoyed You By Critizing Your Drinking Or Drug Use?: No Have You Felt Bad Or Guilty About Your Drinking Or Drug Use?: No Have You Ever Had a Drink or Used Drugs First Thing In The Morning to Steady Your Nerves or to Get Rid of a Hangover?: No CAGE-AID Score: 0  Substance Abuse Education Offered: No

## 2023-11-24 NOTE — Assessment & Plan Note (Deleted)
+   dysarthria. Back to baseline. At this time, no concern for large vessel occlusion. CT head negative in ED. - Neurology following - ASA 81 mg and Plavix 75 mg daily - MRI brain ordered - Labs: Lipid panel, A1c - Keep cardiac telemetry - Neurochecks q4h - Echocardiogram ordered

## 2023-11-25 ENCOUNTER — Other Ambulatory Visit (HOSPITAL_COMMUNITY): Payer: Self-pay

## 2023-11-25 ENCOUNTER — Ambulatory Visit: Admitting: Internal Medicine

## 2023-11-25 DIAGNOSIS — I739 Peripheral vascular disease, unspecified: Secondary | ICD-10-CM | POA: Diagnosis not present

## 2023-11-25 DIAGNOSIS — E785 Hyperlipidemia, unspecified: Secondary | ICD-10-CM | POA: Diagnosis not present

## 2023-11-25 DIAGNOSIS — Z789 Other specified health status: Secondary | ICD-10-CM

## 2023-11-25 DIAGNOSIS — I10 Essential (primary) hypertension: Secondary | ICD-10-CM

## 2023-11-25 DIAGNOSIS — R531 Weakness: Secondary | ICD-10-CM | POA: Diagnosis not present

## 2023-11-25 DIAGNOSIS — G459 Transient cerebral ischemic attack, unspecified: Secondary | ICD-10-CM | POA: Diagnosis not present

## 2023-11-25 LAB — CBC
HCT: 43.8 % (ref 36.0–46.0)
Hemoglobin: 14.3 g/dL (ref 12.0–15.0)
MCH: 26 pg (ref 26.0–34.0)
MCHC: 32.6 g/dL (ref 30.0–36.0)
MCV: 79.8 fL — ABNORMAL LOW (ref 80.0–100.0)
Platelets: 214 10*3/uL (ref 150–400)
RBC: 5.49 MIL/uL — ABNORMAL HIGH (ref 3.87–5.11)
RDW: 15 % (ref 11.5–15.5)
WBC: 4 10*3/uL (ref 4.0–10.5)
nRBC: 0 % (ref 0.0–0.2)

## 2023-11-25 LAB — COMPREHENSIVE METABOLIC PANEL WITH GFR
ALT: 20 U/L (ref 0–44)
AST: 16 U/L (ref 15–41)
Albumin: 3.3 g/dL — ABNORMAL LOW (ref 3.5–5.0)
Alkaline Phosphatase: 68 U/L (ref 38–126)
Anion gap: 9 (ref 5–15)
BUN: 16 mg/dL (ref 8–23)
CO2: 20 mmol/L — ABNORMAL LOW (ref 22–32)
Calcium: 9.3 mg/dL (ref 8.9–10.3)
Chloride: 109 mmol/L (ref 98–111)
Creatinine, Ser: 1.09 mg/dL — ABNORMAL HIGH (ref 0.44–1.00)
GFR, Estimated: 54 mL/min — ABNORMAL LOW (ref >60)
Glucose, Bld: 92 mg/dL (ref 70–99)
Potassium: 3.8 mmol/L (ref 3.5–5.1)
Sodium: 138 mmol/L (ref 135–145)
Total Bilirubin: 1.9 mg/dL — ABNORMAL HIGH (ref 0.0–1.2)
Total Protein: 6.7 g/dL (ref 6.5–8.1)

## 2023-11-25 LAB — HEMOGLOBIN A1C
Hgb A1c MFr Bld: 4.6 % — ABNORMAL LOW (ref 4.8–5.6)
Mean Plasma Glucose: 85 mg/dL

## 2023-11-25 MED ORDER — PROCHLORPERAZINE EDISYLATE 10 MG/2ML IJ SOLN
10.0000 mg | Freq: Once | INTRAMUSCULAR | Status: AC
  Administered 2023-11-25: 10 mg via INTRAVENOUS
  Filled 2023-11-25: qty 2

## 2023-11-25 MED ORDER — ATORVASTATIN CALCIUM 40 MG PO TABS
40.0000 mg | ORAL_TABLET | Freq: Every day | ORAL | 0 refills | Status: AC
  Filled 2023-11-25: qty 30, 30d supply, fill #0

## 2023-11-25 MED ORDER — ASPIRIN 81 MG PO TBEC
81.0000 mg | DELAYED_RELEASE_TABLET | Freq: Every day | ORAL | 12 refills | Status: AC
  Filled 2023-11-25: qty 30, 30d supply, fill #0

## 2023-11-25 MED ORDER — CLOPIDOGREL BISULFATE 75 MG PO TABS
75.0000 mg | ORAL_TABLET | Freq: Every day | ORAL | 0 refills | Status: DC
  Filled 2023-11-25: qty 19, 19d supply, fill #0

## 2023-11-25 NOTE — Plan of Care (Signed)
 Discussed to take it slow. Talked about sudden death of sister in law. Pleasant and denies any discomfort at this time.  Daughter at bedside.    Problem: Education: Goal: Knowledge of General Education information will improve Description: Including pain rating scale, medication(s)/side effects and non-pharmacologic comfort measures Outcome: Adequate for Discharge   Problem: Health Behavior/Discharge Planning: Goal: Ability to manage health-related needs will improve Outcome: Adequate for Discharge   Problem: Clinical Measurements: Goal: Ability to maintain clinical measurements within normal limits will improve Outcome: Adequate for Discharge Goal: Will remain free from infection Outcome: Adequate for Discharge Goal: Diagnostic test results will improve Outcome: Adequate for Discharge Goal: Respiratory complications will improve Outcome: Adequate for Discharge Goal: Cardiovascular complication will be avoided Outcome: Adequate for Discharge   Problem: Activity: Goal: Risk for activity intolerance will decrease Outcome: Adequate for Discharge   Problem: Nutrition: Goal: Adequate nutrition will be maintained Outcome: Adequate for Discharge   Problem: Coping: Goal: Level of anxiety will decrease Outcome: Adequate for Discharge   Problem: Elimination: Goal: Will not experience complications related to bowel motility Outcome: Adequate for Discharge Goal: Will not experience complications related to urinary retention Outcome: Adequate for Discharge   Problem: Pain Managment: Goal: General experience of comfort will improve and/or be controlled Outcome: Adequate for Discharge   Problem: Safety: Goal: Ability to remain free from injury will improve Outcome: Adequate for Discharge   Problem: Skin Integrity: Goal: Risk for impaired skin integrity will decrease Outcome: Adequate for Discharge

## 2023-11-25 NOTE — Plan of Care (Signed)

## 2023-11-25 NOTE — Assessment & Plan Note (Signed)
 HLD: continue increased dose lipitor 40 mg daily (was taking 10 mg)

## 2023-11-25 NOTE — Evaluation (Signed)
 Occupational Therapy Evaluation and Discharge Patient Details Name: Janice Hutchinson MRN: [id number] DOB: 11/07/1950 Today's Date: 11/25/2023   History of Present Illness   Patient is a 73 y/o female admitted 11/24/23 with L side numbness and speech difficulty concern for CVA. 4/29 developed some right sided numbness. MRI negative for acute abnormality.  PMH positive for HTN, prediabetes, HLD, and back issues for which just finished with outpatient PT.     Clinical Impressions This 73 yo female admitted with above presents to acute OT at her baseline level of functioning with reporting that that the numbness/weakness in both UEs has resolved. No further OT needs, we will sign off.             Functional Status Assessment   Patient has not had a recent decline in their functional status     Equipment Recommendations   None recommended by OT      Precautions/Restrictions   Precautions Precautions: None Restrictions Weight Bearing Restrictions Per Provider Order: No     Mobility Bed Mobility Overal bed mobility: Independent                  Transfers Overall transfer level: Independent (no AD)                        Balance Overall balance assessment: No apparent balance deficits (not formally assessed)                                         ADL either performed or assessed with clinical judgement   ADL Overall ADL's : Independent                                             Vision Patient Visual Report: No change from baseline Vision Assessment?: Yes Eye Alignment: Within Functional Limits Ocular Range of Motion: Within Functional Limits Alignment/Gaze Preference: Within Defined Limits Tracking/Visual Pursuits: Able to track stimulus in all quads without difficulty Saccades: Within functional limits Convergence: Within functional limits Visual Fields: No apparent deficits             Pertinent Vitals/Pain Pain Assessment Pain Assessment: No/denies pain     Extremity/Trunk Assessment Upper Extremity Assessment Upper Extremity Assessment: Overall WFL for tasks assessed (pt reports both UEs feel back to thier normal baseline today; able to use funcationally without issues)           Communication Communication Communication: No apparent difficulties   Cognition Arousal: Alert Behavior During Therapy: WFL for tasks assessed/performed Cognition: No apparent impairments                               Following commands: Intact                  Home Living Family/patient expects to be discharged to:: Private residence Living Arrangements: Spouse/significant other Available Help at Discharge: Family Type of Home: House Home Access: Stairs to enter [employer] of Steps: "a few" Entrance Stairs-Rails: Right;Left Home Layout: One level     Bathroom Shower/Tub: Chief Strategy Officer: Standard     Home Equipment: None      Lives With: Spouse  Prior Functioning/Environment Prior Level of Function : Independent/Modified Independent             Mobility Comments: 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              OT Goals(Current goals can be found in the care plan section)   Acute Rehab OT Goals Patient Stated Goal: to go home soon   AM-PAC OT "6 Clicks" Daily Activity     Outcome Measure Help from another person eating meals?: None Help from another person taking care of personal grooming?: None Help from another person toileting, which includes using toliet, bedpan, or urinal?: None Help from another person bathing (including washing, rinsing, drying)?: None Help from another person to put on and taking off regular upper body clothing?: None Help from another person to put on and taking off regular lower body clothing?: None 6 Click Score: 24   End of  Session    Activity Tolerance: Patient tolerated treatment well Patient left:  (at sink getting ready to get washed up)                   Time: 4098-1191 OT Time Calculation (min): 12 min Charges:  OT General Charges $OT Visit: 1 Visit OT Evaluation $OT Eval Low Complexity: 1 Low Janice Hutchinson OT Acute Rehabilitation Services Office [phone]    Janice Hutchinson 11/25/2023, 8:57 AM

## 2023-11-25 NOTE — Progress Notes (Addendum)
 STROKE TEAM PROGRESS NOTE    INTERIM HISTORY/SUBJECTIVE  Patient has remained hemodynamically stable and afebrile overnight.  She did have an episode of right facial numbness overnight with stuttering speech, but symptoms resolved spontaneously.  She reports some minor numbness in V1 on the left but no other symptoms at this time.  OBJECTIVE  CBC    Component Value Date/Time   WBC 4.0 11/25/2023 0544   RBC 5.49 (H) 11/25/2023 0544   HGB 14.3 11/25/2023 0544   HGB 15.3 05/29/2022 1600   HCT 43.8 11/25/2023 0544   HCT 47.4 (H) 05/29/2022 1600   PLT 214 11/25/2023 0544   MCV 79.8 (L) 11/25/2023 0544   MCV 80 05/29/2022 1600   MCH 26.0 11/25/2023 0544   MCHC 32.6 11/25/2023 0544   RDW 15.0 11/25/2023 0544   RDW 14.3 05/29/2022 1600   LYMPHSABS 2.6 11/24/2023 0619   LYMPHSABS 2.4 05/29/2022 1600   MONOABS 0.5 11/24/2023 0619   EOSABS 0.1 11/24/2023 0619   EOSABS 0.1 05/29/2022 1600   BASOSABS 0.0 11/24/2023 0619   BASOSABS 0.0 05/29/2022 1600    BMET    Component Value Date/Time   NA 138 11/25/2023 0544   NA 140 02/03/2023 0000   K 3.8 11/25/2023 0544   CL 109 11/25/2023 0544   CO2 20 (L) 11/25/2023 0544   GLUCOSE 92 11/25/2023 0544   BUN 16 11/25/2023 0544   BUN 18 02/03/2023 0000   CREATININE 1.09 (H) 11/25/2023 0544   CALCIUM 9.3 11/25/2023 0544   EGFR 62 02/03/2023 0000   EGFR 47 (L) 05/29/2022 1600   GFRNONAA 54 (L) 11/25/2023 0544    IMAGING past 24 hours ECHOCARDIOGRAM COMPLETE Result Date: 11/24/2023    ECHOCARDIOGRAM REPORT   Patient Name:   BRANDYLEE BERTELLI Date of Exam: 11/24/2023 Medical Rec #:  [id number]      Height:       66.0 in Accession #:    [id number]     Weight:       192.0 lb Date of Birth:  [birth year]      BSA:          1.965 m Patient Age:    73 years       BP:           147/71 mmHg Patient Gender: F              HR:           52 bpm. Exam Location:  Inpatient Procedure: 2D Echo, Cardiac Doppler and Color Doppler (Both Spectral and Color             Flow Doppler were utilized during procedure). Indications:    stroke  History:        Patient has prior history of Echocardiogram examinations, most                 recent 05/23/2019. Risk Factors:Hypertension.  Sonographer:    Janette Medley Referring Phys: [id number] Malena Timpone IMPRESSIONS  1. Left ventricular ejection fraction, by estimation, is 50 to 55%. The left ventricle has low normal function. The left ventricle has no regional wall motion abnormalities. Left ventricular diastolic parameters are consistent with Grade II diastolic dysfunction (pseudonormalization).  2. Right ventricular systolic function is normal. The right ventricular size is normal. Tricuspid regurgitation signal is inadequate for assessing PA pressure.  3. The mitral valve is normal in structure. No evidence of mitral valve regurgitation. No evidence of mitral stenosis.  4. The aortic  valve is tricuspid. Aortic valve regurgitation is trivial. No aortic stenosis is present.  5. The inferior vena cava is normal in size with greater than 50% respiratory variability, suggesting right atrial pressure of 3 mmHg. Comparison(s): Prior images unable to be directly viewed, comparison made by report only. FINDINGS  Left Ventricle: Left ventricular ejection fraction, by estimation, is 50 to 55%. The left ventricle has low normal function. The left ventricle has no regional wall motion abnormalities. The left ventricular internal cavity size was normal in size. There is no left ventricular hypertrophy. Left ventricular diastolic parameters are consistent with Grade II diastolic dysfunction (pseudonormalization). Right Ventricle: The right ventricular size is normal. Right vetricular wall thickness was not well visualized. Right ventricular systolic function is normal. Tricuspid regurgitation signal is inadequate for assessing PA pressure. Left Atrium: Left atrial size was normal in size. Right Atrium: Right atrial size was normal in size. Pericardium:  There is no evidence of pericardial effusion. Mitral Valve: The mitral valve is normal in structure. No evidence of mitral valve regurgitation. No evidence of mitral valve stenosis. Tricuspid Valve: The tricuspid valve is normal in structure. Tricuspid valve regurgitation is not demonstrated. Aortic Valve: The aortic valve is tricuspid. Aortic valve regurgitation is trivial. No aortic stenosis is present. Pulmonic Valve: The pulmonic valve was not well visualized. Pulmonic valve regurgitation is trivial. No evidence of pulmonic stenosis. Aorta: The aortic root and ascending aorta are structurally normal, with no evidence of dilitation. Venous: The inferior vena cava is normal in size with greater than 50% respiratory variability, suggesting right atrial pressure of 3 mmHg. IAS/Shunts: No atrial level shunt detected by color flow Doppler.  LEFT VENTRICLE PLAX 2D LVIDd:         4.60 cm   Diastology LVIDs:         3.30 cm   LV e' medial:    5.11 cm/s LV PW:         1.00 cm   LV E/e' medial:  13.8 LV IVS:        1.00 cm   LV e' lateral:   5.33 cm/s LVOT diam:     2.00 cm   LV E/e' lateral: 13.3 LV SV:         53 LV SV Index:   27 LVOT Area:     3.14 cm  RIGHT VENTRICLE             IVC RV S prime:     13.40 cm/s  IVC diam: 2.00 cm TAPSE (M-mode): 1.8 cm LEFT ATRIUM             Index        RIGHT ATRIUM           Index LA Vol (A2C):   34.5 ml 17.55 ml/m  RA Area:     12.40 cm LA Vol (A4C):   19.8 ml 10.07 ml/m  RA Volume:   26.50 ml  13.48 ml/m LA Biplane Vol: 28.4 ml 14.45 ml/m  AORTIC VALVE LVOT Vmax:   73.10 cm/s LVOT Vmean:  46.700 cm/s LVOT VTI:    0.169 m  AORTA Ao Root diam: 3.20 cm Ao Asc diam:  3.50 cm MV E velocity: 70.70 cm/s                            SHUNTS  Systemic VTI:  0.17 m                            Systemic Diam: 2.00 cm Mihai Croitoru MD Electronically signed by Luana Rumple MD Signature Date/Time: 11/24/2023/5:05:23 PM    Final     Vitals:   11/24/23 2343  11/25/23 0327 11/25/23 0828 11/25/23 1159  BP: (!) 101/52 (!) 151/73 113/66 118/64  Pulse: (!) 59 65 62 60  Resp: 17 18 18    Temp: 98 F (36.7 C) 98 F (36.7 C) 97.8 F (36.6 C) (!) 97.4 F (36.3 C)  TempSrc:   Oral Oral  SpO2: 98% 98% 98% 100%  Weight:      Height:         PHYSICAL EXAM General:  Alert, well-nourished, well-developed patient in no acute distress Psych:  Mood and affect appropriate for situation Respiratory:  Regular, unlabored respirations on room air   NEURO:  Mental Status: AA&Ox3, patient is able to give clear and coherent history Speech/Language: speech is without dysarthria or aphasia.  Cranial Nerves:  II: PERRL. Visual fields full.  III, IV, VI: EOMI. Eyelids elevate symmetrically.  V: Sensation is intact to light touch and symmetrical to face.  VII: Face is symmetrical resting and smiling VIII: hearing intact to voice. IX, X: Phonation is normal.  WJ:[id number] shrug 5/5. XII: tongue is midline without fasciculations. Motor: 5/5 strength to all muscle groups tested.  Tone: is normal and bulk is normal Sensation- Intact to light touch bilaterally. Coordination: FTN intact bilaterally Gait- deferred  Most Recent NIH 0  ASSESSMENT/PLAN  Ms. Janice Hutchinson is a 73 y.o. female with history of hypertension and sickle cell trait admitted for acute onset left-sided numbness.  MRI was negative for acute infarct, and deficits have largely resolved.  NIH on Admission 4  TIA: Right sided TIA Code Stroke CT head No acute abnormality. Small vessel disease. ASPECTS 10.    CTA head & neck no LVO, tortuosity ectasia and mildly.  Appearance of distal right ICA and vert vertebral V3 segment suspicious for fibromuscular dysplasia CT perfusion negative for core infarct or penumbra MRI no acute abnormality 2D Echo EF 50 to 55%  LDL 98 HgbA1c 4.6 VTE prophylaxis -Lovenox No antithrombotic prior to admission, now on aspirin 81 mg daily and clopidogrel 75  mg daily for 3 weeks and then aspirin alone. Therapy recommendations:  No follow up needed  Disposition: Home  Hypertension Home meds: Amlodipine  5 mg daily, p.o. propanol 10 mg daily, losartan 100 mg daily Stable Maintain normotension  Hyperlipidemia Home meds: None LDL 98, goal < 70 Add atorvastatin 40 mg daily Continue statin at discharge  Other Stroke Risk Factors Obesity, Body mass index is 30.99 kg/m., BMI >/= 30 associated with increased stroke risk, recommend weight loss, diet and exercise as appropriate  Advanced age  Other Active Problems None  Hospital day # 0  Patient seen by NP and then by MD, MD to edit note is needed. Cortney E Bucky Cardinal , MSN, AGACNP-BC [facility] See Amion for schedule and pager information 11/25/2023 12:33 PM   ATTENDING NOTE: I reviewed above note and agree with the assessment and plan. Pt was seen and examined.   Daughter and husband are at the bedside. Pt sitting at the edge of bed, neuro intact. Seems to have a TIA episode now on DAPT and statin. Will follow up at [facility].   For detailed assessment  and plan, please refer to above as I have made changes wherever appropriate.   Consuelo Denmark, MD PhD Stroke Neurology 11/25/2023 6:46 PM   To contact Stroke Continuity provider, please refer to [email or web address]. After hours, contact General Neurology

## 2023-11-25 NOTE — Care Management Obs Status (Signed)
 MEDICARE OBSERVATION STATUS NOTIFICATION   Patient Details  Name: Janice Hutchinson MRN: [id number] Date of Birth: [birth year]   Medicare Observation Status Notification Given:  Yes  Moon letter signed and copy given  Wynonia Hedges 11/25/2023, 9:50 AM

## 2023-11-25 NOTE — Plan of Care (Signed)
 Overnight neurology note  I was called by the RN regarding patient complaining of right sided facial numbness.  I went and examined her at bedside. She is awake alert oriented x 3.  Her speech again has a what sounds like a volitional stutter.  She reports right sided facial numbness and diminished sensation to light touch.  On vibratory sense, she also reports vibratory sense diminished on the right forehead in comparison to the left.  Sensation in right hemibody diminished in comparison to the left.  Rest of the examination was unremarkable. She has had an MRI over the course of the day which was unremarkable.  She had initially presented with left-sided numbness symptoms that have since resolved.  She reports of a mild headache which is holocephalic.  Recommendations: I would recommend giving a dose of Compazine and reassessing in the morning.  If symptoms persist, we will consider an MRI.  Due to somewhat functional exam as above and her symptoms are too mild to consider treatment with TNK hence I did not recommend activating a code stroke.  Plan was relayed to the primary team via secure chat.  Stroke team to follow  -- Tona Francis MD On Call Neurologist

## 2023-11-25 NOTE — Progress Notes (Signed)
 Stroke education  and risk factors on HTN, Hyperlipidemia and Prediabetes given to her prior to discharge.

## 2023-11-25 NOTE — Assessment & Plan Note (Addendum)
 Thought to be TIA, Neurology following. Had episode of R sided facial numbness overnight. Neurology saw, thought may be migraine. A1C 4.6, lipid panel wnl. Echo showed EF of 50-55% with grade II diastolic dysfunction.  - neurology following, appreciate recs - continue ASA 81 mg and plavix 75 mg daily - will need outpatient cardiology follow up

## 2023-11-25 NOTE — TOC Transition Note (Signed)
 Transition of Care [facility]) - Discharge Note   Patient Details  Name: Janice Hutchinson MRN: [id number] Date of Birth: 1950-08-24  Transition of Care [facility]) CM/SW Contact:  Jonathan Neighbor, RN Phone Number: 11/25/2023, 11:18 AM   Clinical Narrative:     Pt is discharging home with self care. No follow up per therapies.  Pt lives with spouse.  She has transport home.  Final next level of care: Home/Self Care Barriers to Discharge: No Barriers Identified   Patient Goals and CMS Choice            Discharge Placement                       Discharge Plan and Services Additional resources added to the After Visit Summary for                                       Social Drivers of Health (SDOH) Interventions SDOH Screenings   Food Insecurity: No Food Insecurity (11/24/2023)  Housing: Low Risk  (11/24/2023)  Transportation Needs: No Transportation Needs (11/24/2023)  Utilities: Not At Risk (11/24/2023)  Depression (PHQ2-9): Low Risk  (05/01/2022)  Social Connections: Socially Integrated (11/24/2023)  Tobacco Use: Low Risk  (11/24/2023)     Readmission Risk Interventions     No data to display

## 2023-11-25 NOTE — Discharge Instructions (Signed)
 Dear Janice Hutchinson,  Thank you for letting us  participate in your care. You were hospitalized for weakness and diagnosed with Acute left-sided weakness and TIA. You were treated with medications. We discharged you on new medications to help prevent this from happening in the future.    POST-HOSPITAL & CARE INSTRUCTIONS Please follow up with your PCP in the next week. Go to your follow up appointments (listed below)   DOCTOR'S APPOINTMENT   No future appointments.  Follow-up Information     [facility]. Schedule an appointment as soon as possible for a visit in 8 week(s).   Specialty: Neurology Why: Please see Dr. Janett Medin or associate in 8 weeks. OK to follow up with NP. Contact information: [address] [place]  [address] [phone]                Take care and be well!  Family Medicine Teaching Service Inpatient Team   [facility]  [address] [place], [place] [address] [phone]

## 2023-11-25 NOTE — Progress Notes (Signed)
 SLP Cancellation Note  Patient Details Name: Vyktoria Losurdo MRN: [id number] DOB: 1951/05/26   Cancelled treatment:  Mrs. Spirk is preparing to be D/Cd home. Her speech has improved to normal and all symptoms have resolved. No f/u needed. Will sign off.  Vonita Calloway L. Beatris Lincoln, MA CCC/SLP Clinical Specialist - Acute Care SLP Acute Rehabilitation Services Office number [phone]          Myna Asal Laurice 11/25/2023, 11:11 AM

## 2023-11-25 NOTE — Assessment & Plan Note (Addendum)
 BP wnl this AM. Will continue to monitor and add home hypertensives if BP elevates. - hold home meds for now (amlodipine  5 mg daily, losartan 100 mg daily )

## 2023-11-25 NOTE — Discharge Summary (Signed)
 Family Medicine Teaching [facility] Discharge Summary  Patient name: Janice Hutchinson Medical record number: [id number] Date of birth: 1950/09/16 Age: 73 y.o. Gender: female Date of Admission: 11/24/2023  Date of Discharge: 11/25/23  Admitting Physician: Vallorie Gayer, DO  Primary Care Provider: Olan Bering, MD Consultants: Neurology   Indication for Hospitalization: Weakness  Discharge Diagnoses/Problem List:  Principal Problem for Admission: TIA Other Problems addressed during stay:  Principal Problem:   Acute left-sided weakness Active Problems:   Left-sided weakness   Hypertension   Hyperlipidemia   Chronic health problem    Brief Hospital Course:  Daley Riggan is a 73 year old female with history of HTN, mitral valve prolapse, obesity, previous, hyperlipidemia who was admitted for stroke rule out in setting of acute left-sided weakness.  Acute left-sided weakness Patient presented with dysarthria, left-sided weakness and numbness/tingling.  Code stroke was called, CT head negative in the ED and MRI brain unremarkable. At time of admission, dysarthria and weakness subsided however patient had residual decreased sensation in left face/left upper extremity. Risk stratification labs ordered and showed A1c of 4.6%. Lipid panel largely normal.  Started on DAPT, plan for 21 days of ASA and plavix and then just ASA daily.  CTA did show possible FMD, and neurology was consulted who recommended follow up outpatient and believe this is likely 2/2 to TIA.   Hypertension Patient with hypertension, on amlodipine  5 mg daily and losartan 100 mg daily.  Allowed for permissive hypertension for the initial 24-48 hours.  Regimen at discharge was held until patient followed up with her PCP.  Other chronic conditions were medically managed with home medications and formulary alternatives as necessary ()  Follow-up recommendations Echo showed EF of 50-55% with grade II  diastolic dysfunction, will need cardiology follow up  Increased indirect bilirubin, may have Gilbert syndrome.  Consider work up for fibromuscular dysplasia outpatient (renal US )  Disposition: Home  Discharge Condition: Stable  Discharge Exam:  Vitals:   11/25/23 0828 11/25/23 1159  BP: 113/66 118/64  Pulse: 62 60  Resp: 18   Temp: 97.8 F (36.6 C) (!) 97.4 F (36.3 C)  SpO2: 98% 100%   General: A&O, NAD Cardiac: RRR, no m/r/g Respiratory: CTAB, normal WOB GI: Soft, NTTP, non-distended  Extremities: NTTP, no peripheral edema. Neuro: Normal gait, moves all four extremities appropriately. Psych: Appropriate mood and affect   Significant Procedures: N.A  Significant Labs and Imaging:  Recent Labs  Lab 11/24/23 0619 11/24/23 0622 11/25/23 0544  WBC 5.2  --  4.0  HGB 14.8 15.3* 14.3  HCT 46.1* 45.0 43.8  PLT 228  --  214   Recent Labs  Lab 11/24/23 0619 11/24/23 0622 11/25/23 0544  NA 139 140 138  K 3.6 3.6 3.8  CL 106 110 109  CO2 22  --  20*  GLUCOSE 98 98 92  BUN 15 17 16   CREATININE 0.98 1.00 1.09*  CALCIUM 9.7  --  9.3  ALKPHOS 81  --  68  AST 18  --  16  ALT 21  --  20  ALBUMIN 3.7  --  3.3*     Pertinent Imaging   CT head:  1. Negative for age noncontrast CT appearance of the brain; minimal to mild white matter changes. ASPECTS 10. 2. These results were communicated to Dr. Arora at 6:28 am on 11/24/2023 by text page via the [facility] messaging system.  CT Angio head and neck: 1. Negative for large vessel occlusion, and Negative CT  Perfusion. 2. Minimal atherosclerosis in the head and neck but Positive for vessel tortuosity, ectasia, and mildly beaded appearance of the distal Right ICA and the Right vertebral V3 segment suspicious for Fibromuscular Dysplasia (FMD). 3.  Aortic Atherosclerosis (ICD10-I70.0).  MRI brain: 1. No acute intracranial abnormality identified. 2. Mild to moderate for age mostly subcortical cerebral white matter signal  changes  Echo: 1. Left ventricular ejection fraction, by estimation, is 50 to 55%. The  left ventricle has low normal function. The left ventricle has no regional  wall motion abnormalities. Left ventricular diastolic parameters are  consistent with Grade II diastolic  dysfunction (pseudonormalization).   2. Right ventricular systolic function is normal. The right ventricular  size is normal. Tricuspid regurgitation signal is inadequate for assessing  PA pressure.   3. The mitral valve is normal in structure. No evidence of mitral valve  regurgitation. No evidence of mitral stenosis.   4. The aortic valve is tricuspid. Aortic valve regurgitation is trivial.  No aortic stenosis is present.   5. The inferior vena cava is normal in size with greater than 50%  respiratory variability, suggesting right atrial pressure of 3 mmHg.   Results/Tests Pending at Time of Discharge: none   Discharge Medications:  Allergies as of 11/25/2023       Reactions   Tape Other (See Comments)   Skin tears/redness/irritation (PAPER TAPE IS OK)   Metformin And Related Diarrhea, Other (See Comments)        Medication List     PAUSE taking these medications    amLODipine  5 MG tablet Wait to take this until your doctor or other care provider tells you to start again. Commonly known as: NORVASC  Take 5 mg by mouth in the morning and at bedtime.   bisoprolol  10 MG tablet Wait to take this until your doctor or other care provider tells you to start again. Commonly known as: ZEBETA  Take by mouth in the morning.   losartan 100 MG tablet Wait to take this until your doctor or other care provider tells you to start again. Commonly known as: COZAAR Take 100 mg by mouth in the morning.       TAKE these medications    aspirin EC 81 MG tablet Take 1 tablet (81 mg total) by mouth daily. Swallow whole.   atorvastatin 40 MG tablet Commonly known as: LIPITOR Take 1 tablet (40 mg total) by mouth  daily. What changed:  medication strength how much to take when to take this   clopidogrel 75 MG tablet Commonly known as: PLAVIX Take 1 tablet (75 mg total) by mouth daily.   SOOTHE XP OP Place 1-2 drops into both eyes 3 (three) times daily as needed (dry/irritated eyes.).   zonisamide  25 MG capsule Commonly known as: ZONEGRAN  Take 1 capsule (25 mg total) by mouth daily.        Discharge Instructions: Please refer to Patient Instructions section of EMR for full details.  Patient was counseled important signs and symptoms that should prompt return to medical care, changes in medications, dietary instructions, activity restrictions, and follow up appointments.   Follow-Up Appointments:  Follow-up Information     [facility]. Schedule an appointment as soon as possible for a visit in 1 month(s).   Specialty: Neurology Why: stroke clinic Contact information: [address] [place]  [address] [phone]               Farris Hong, MD 11/25/2023, 12:43  PM PGY-1, [facility] Family Medicine

## 2023-11-25 NOTE — Assessment & Plan Note (Deleted)
 Obtain lipid panel - Will likely need high intensity statin; LDL goal <70

## 2023-11-25 NOTE — Progress Notes (Signed)
     Daily Progress Note Intern Pager: [phone]  Patient name: Janice Hutchinson Medical record number: [id number] Date of birth: 11/27/1950 Age: 73 y.o. Gender: female  Primary Care Provider: Olan Bering, MD Consultants: Neurology Code Status: Full  Pt Overview and Major Events to Date:  4/29: admitted   Assessment and Plan: Patient is a 73 year old female with past medical history of hypertension, prediabetes, hyperlipidemia admitted for TIA workup. MRI negative.  Assessment & Plan Acute left-sided weakness Thought to be TIA, Neurology following. Had episode of R sided facial numbness overnight. Neurology saw, thought may be migraine. A1C 4.6, lipid panel wnl. Echo showed EF of 50-55% with grade II diastolic dysfunction.  - neurology following, appreciate recs - continue ASA 81 mg and plavix 75 mg daily - will need outpatient cardiology follow up  Hypertension BP wnl this AM. Will continue to monitor and add home hypertensives if BP elevates. - hold home meds for now (amlodipine  5 mg daily, losartan 100 mg daily )  Chronic health problem HLD: continue increased dose lipitor 40 mg daily (was taking 10 mg)   FEN/GI: HH PPx: lovenox  Dispo: likely home   Subjective:  Feeling better, no longer has any symptoms   Objective: Temp:  [97.8 F (36.6 C)-98.1 F (36.7 C)] 98 F (36.7 C) (04/30 0327) Pulse Rate:  [50-65] 65 (04/30 0327) Resp:  [14-19] 18 (04/30 0327) BP: (101-151)/(52-73) 151/73 (04/30 0327) SpO2:  [98 %-100 %] 98 % (04/30 0327) Physical Exam: General: NAD, well appearing Cardiovascular: RRR, no m/r/g Respiratory: CTAB Abdomen: soft, NTND Extremities: moves all extremities spontaneously   Laboratory: Most recent CBC Lab Results  Component Value Date   WBC 4.0 11/25/2023   HGB 14.3 11/25/2023   HCT 43.8 11/25/2023   MCV 79.8 (L) 11/25/2023   PLT 214 11/25/2023   Most recent BMP    Latest Ref Rng & Units 11/24/2023    6:22 AM  BMP   Glucose 70 - 99 mg/dL 98   BUN 8 - 23 mg/dL 17   Creatinine 1.47 - 1.00 mg/dL 8.29   Sodium 562 - 130 mmol/L 140   Potassium 3.5 - 5.1 mmol/L 3.6   Chloride 98 - 111 mmol/L 110     Imaging/Diagnostic Tests: No new imaging  Farris Hong, MD 11/25/2023, 7:25 AM  PGY-1, [place] [facility] FPTS Intern pager: [phone], text pages welcome Secure chat group [facility] Teaching Service

## 2023-11-30 DIAGNOSIS — Z8673 Personal history of transient ischemic attack (TIA), and cerebral infarction without residual deficits: Secondary | ICD-10-CM | POA: Diagnosis not present

## 2023-11-30 DIAGNOSIS — I10 Essential (primary) hypertension: Secondary | ICD-10-CM | POA: Diagnosis not present

## 2023-11-30 DIAGNOSIS — R471 Dysarthria and anarthria: Secondary | ICD-10-CM | POA: Diagnosis not present

## 2023-12-07 DIAGNOSIS — E785 Hyperlipidemia, unspecified: Secondary | ICD-10-CM | POA: Diagnosis not present

## 2023-12-07 DIAGNOSIS — Z8673 Personal history of transient ischemic attack (TIA), and cerebral infarction without residual deficits: Secondary | ICD-10-CM | POA: Diagnosis not present

## 2023-12-07 DIAGNOSIS — R471 Dysarthria and anarthria: Secondary | ICD-10-CM | POA: Diagnosis not present

## 2023-12-07 DIAGNOSIS — I10 Essential (primary) hypertension: Secondary | ICD-10-CM | POA: Diagnosis not present

## 2023-12-08 ENCOUNTER — Ambulatory Visit (INDEPENDENT_AMBULATORY_CARE_PROVIDER_SITE_OTHER): Admitting: Allergy and Immunology

## 2023-12-08 VITALS — BP 146/86 | HR 53 | Temp 98.3°F | Resp 12 | Ht 64.76 in | Wt 198.7 lb

## 2023-12-08 DIAGNOSIS — L230 Allergic contact dermatitis due to metals: Secondary | ICD-10-CM | POA: Diagnosis not present
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
## 2023-12-09 ENCOUNTER — Encounter: Payer: Self-pay | Admitting: Allergy and Immunology

## 2023-12-13 NOTE — Progress Notes (Signed)
   Follow Up Note  RE: Janice Hutchinson MRN: [id number] DOB: [birth year] Date of Office Visit: 12/14/2023  Referring provider: Olan Bering, MD Primary care provider: Olan Bering, MD  History of Present Illness: I had the pleasure of seeing Janice Hutchinson for a follow up visit at the [facility] on 12/14/2023. She is a 73 y.o. female, who is being followed for possible metal allergy. Today she is here for patch test placement, given suspected history of contact dermatitis.   Diagnostics: Metal patches placed.   Assessment and Plan: Janice Hutchinson is a 74 y.o. female with: Allergic contact dermatitis due to other agents Metal Patches placed today. Please avoid strenuous physical activities and do not get the patches on the back wet. No showering until final patch reading done. Okay to take antihistamines for itching but avoid placing any creams on the back where the patches are. We will remove the patches on Wednesday and will do our initial read. Then you will come back on Friday for final day per Dr. Kozlow.  It was my pleasure to see Janice Hutchinson today and participate in her care. Please feel free to contact me with any questions or concerns.  Sincerely,  Eudelia Hero, DO Allergy & Immunology  [facility] [place]  [facility] office: [phone] [facility] office: [phone]

## 2023-12-14 ENCOUNTER — Ambulatory Visit: Admitting: Allergy

## 2023-12-14 ENCOUNTER — Encounter: Payer: Self-pay | Admitting: Allergy

## 2023-12-14 DIAGNOSIS — L2389 Allergic contact dermatitis due to other agents: Secondary | ICD-10-CM | POA: Diagnosis not present

## 2023-12-14 NOTE — Patient Instructions (Addendum)
 Patches placed today. Please avoid strenuous physical activities and do not get the patches on the back wet. No showering until final patch reading done. Okay to take antihistamines for itching but avoid placing any creams on the back where the patches are. We will remove the patches on Wednesday and will do our initial read. Then you will come back on Friday for final day per Dr. Kozlow.  Check if [facility] has openings on Wednesday and Friday for patch readings.

## 2023-12-16 ENCOUNTER — Encounter: Admitting: Allergy

## 2023-12-16 ENCOUNTER — Ambulatory Visit (INDEPENDENT_AMBULATORY_CARE_PROVIDER_SITE_OTHER): Admitting: Allergy and Immunology

## 2023-12-16 DIAGNOSIS — L230 Allergic contact dermatitis due to metals: Secondary | ICD-10-CM

## 2023-12-16 NOTE — Progress Notes (Signed)
 Janice Hutchinson returns for a 48-hour patch test read.  Examination of her patch test placement sites do not identify any hypersensitivity.  She will return to this clinic for a 5-day read.

## 2023-12-18 ENCOUNTER — Encounter: Admitting: Family Medicine

## 2023-12-18 ENCOUNTER — Ambulatory Visit (INDEPENDENT_AMBULATORY_CARE_PROVIDER_SITE_OTHER): Admitting: Internal Medicine

## 2023-12-18 DIAGNOSIS — L230 Allergic contact dermatitis due to metals: Secondary | ICD-10-CM | POA: Diagnosis not present

## 2023-12-18 NOTE — Progress Notes (Signed)
   Follow Up Note  RE: Janice Hutchinson MRN: [id number] DOB: 06-10-1951 Date of Office Visit: 12/18/2023  Referring provider: Olan Bering, MD Primary care provider: Olan Bering, MD  History of Present Illness: I had the pleasure of seeing Janice Hutchinson for a follow up visit at the [facility] [place] on 12/18/2023. She is a 73 y.o. female, who is being followed for contact dermatitis to mteals. Today she is here for final patch test interpretation, given suspected history of contact dermatitis.   Diagnostics:  METALS  96 hour reading: Negative   Metals Patch - 12/18/23 0800     Time Antigen Placed 0930    Manufacturer Other    Location Back    Number of Test 11    Reading Interval Day 3    Aluminum Hydroxide 10% 0    Chromium chloride 1% 0    Cobalt chloride hexahydrate 1% 0    Molybdenum chloride 0.5% 0    Nickel sulfate hexahydrate 5% 0    Potassium dichromate 0.25% 0    Copper sulfate pentahydrate 2% 0    Tantal 1% 0    Titanium 0.1% 0    Manganese chloride 0.5% 0    Vanadium Pentoxide 10% 0              Assessment and Plan: Janice Hutchinson is a 73 y.o. female with: Concern for Contact Dermatitis:  The patient has been provided detailed information regarding the substances she is sensitive to, as well as products containing the substances.  Meticulous avoidance of these substances is recommended. If avoidance is not possible, the use of barrier creams or lotions is recommended. If symptoms persist or progress despite meticulous avoidance of chemicals/substances above, dermatology evaluation may be warranted. No follow-ups on file.  It was my pleasure to see Janice Hutchinson today and participate in her care. Please feel free to contact me with any questions or concerns.  Sincerely,   Orelia Binet, MD [facility] [place]

## 2023-12-22 ENCOUNTER — Encounter: Payer: Self-pay | Admitting: Neurology

## 2023-12-22 ENCOUNTER — Ambulatory Visit: Admitting: Neurology

## 2023-12-22 VITALS — BP 150/80 | HR 56 | Ht 64.0 in | Wt 197.0 lb

## 2023-12-22 DIAGNOSIS — G459 Transient cerebral ischemic attack, unspecified: Secondary | ICD-10-CM | POA: Diagnosis not present

## 2023-12-22 DIAGNOSIS — I10 Essential (primary) hypertension: Secondary | ICD-10-CM | POA: Diagnosis not present

## 2023-12-22 DIAGNOSIS — E785 Hyperlipidemia, unspecified: Secondary | ICD-10-CM
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
## 2023-12-22 NOTE — Patient Instructions (Addendum)
 Follow up in 1 year will repeat CTA head and neck    IMPRESSION: 1. Negative for large vessel occlusion, and Negative CT Perfusion. 2. Minimal atherosclerosis in the head and neck but Positive for vessel tortuosity, ectasia, and mildly beaded appearance of the distal Right ICA and the Right vertebral V3 segment suspicious for Fibromuscular Dysplasia (FMD). 3.  Aortic Atherosclerosis (ICD10-I70.0).  Strict management of vascular risk factors with a goal BP less than 130/90, A1c less than 7.0, LDL less than 70 for secondary stroke prevention Continue aspirin  81 mg daily for secondary stroke prevention   Per Hospital Discharge:  Follow-up recommendations Echo showed EF of 50-55% with grade II diastolic dysfunction, will need cardiology follow up  Increased indirect bilirubin, may have Gilbert syndrome.  Consider work up for fibromuscular dysplasia outpatient (renal US )

## 2023-12-23 DIAGNOSIS — R471 Dysarthria and anarthria: Secondary | ICD-10-CM | POA: Diagnosis not present

## 2023-12-23 NOTE — Progress Notes (Signed)
 I agree with the above plan

## 2023-12-25 DIAGNOSIS — M4316 Spondylolisthesis, lumbar region: Secondary | ICD-10-CM | POA: Diagnosis not present

## 2023-12-30 ENCOUNTER — Encounter (INDEPENDENT_AMBULATORY_CARE_PROVIDER_SITE_OTHER): Payer: Self-pay | Admitting: Internal Medicine

## 2023-12-30 ENCOUNTER — Ambulatory Visit (INDEPENDENT_AMBULATORY_CARE_PROVIDER_SITE_OTHER): Admitting: Internal Medicine

## 2023-12-30 VITALS — BP 138/78 | HR 52 | Temp 97.6°F | Ht 66.0 in | Wt 193.0 lb

## 2023-12-30 DIAGNOSIS — Q798 Other congenital malformations of musculoskeletal system: Secondary | ICD-10-CM

## 2023-12-30 DIAGNOSIS — Z6835 Body mass index (BMI) 35.0-35.9, adult: Secondary | ICD-10-CM

## 2023-12-30 DIAGNOSIS — G459 Transient cerebral ischemic attack, unspecified: Secondary | ICD-10-CM

## 2023-12-30 DIAGNOSIS — M549 Dorsalgia, unspecified: Secondary | ICD-10-CM | POA: Diagnosis not present

## 2023-12-30 DIAGNOSIS — E66812 Obesity, class 2: Secondary | ICD-10-CM | POA: Diagnosis not present

## 2023-12-30 DIAGNOSIS — I10 Essential (primary) hypertension: Secondary | ICD-10-CM | POA: Diagnosis not present

## 2023-12-30 MED ORDER — ZONISAMIDE 25 MG PO CAPS: 25.0000 mg | ORAL_CAPSULE | Freq: Every day | ORAL | 0 refills | Status: DC

## 2023-12-30 NOTE — Progress Notes (Unsigned)
 Office: [phone]  /  Fax: [phone]  Weight Summary And Biometrics  Vitals Temp: 97.6 F (36.4 C) BP: 138/78 Pulse Rate: (!) 52 SpO2: 99 %   Anthropometric Measurements Height: 5\' 6"  (1.676 m) Weight: 193 lb (87.5 kg) BMI (Calculated): 31.17 Weight at Last Visit: 192 lb Weight Lost Since Last Visit: 0 lb Weight Gained Since Last Visit: 1 lb Starting Weight: 215 lb Total Weight Loss (lbs): 22 lb (9.979 kg) Peak Weight: 217 lb   Body Composition  Body Fat %: 44.3 % Fat Mass (lbs): 85.4 lbs Muscle Mass (lbs): 102.2 lbs Total Body Water  (lbs): 73 lbs Visceral Fat Rating : 13    RMR: 1310  Today's Visit #: 21  Starting Date: 05/11/22   Subjective   Chief Complaint: Obesity  Interval History Discussed the use of AI scribe software for clinical note transcription with the patient, who gave verbal consent to proceed.  History of Present Illness   Janice Hutchinson is a 73 year old female who presents for medical weight management.  She experienced a transient ischemic attack (TIA) on April 29th, characterized by left-sided weakness, facial tingling, and aphasia. Initially, she attributed the weakness to her back issues. Her husband insisted on going to the emergency room, where she was unable to speak due to aphasia.  She had neuroimaging was seen by neurology.  She has been attending speech therapy since the event.  Her current medications include aspirin , with a previous course of Plavix  for three weeks. Her Lipitor dosage was increased to 40 mg daily. She is on bisoprolol , losartan, and amlodipine  for blood pressure management, which has been rising over the past two weeks. Her blood pressure was 133/82 this morning, and she has been sending her readings to her doctor.  She has not been exercising much since the TIA, only walking in her driveway. She is focusing on stretching and walking for the next two weeks before resuming more intense exercise. She is  conscious of her diet, trying to maintain her weight despite a lack of appetite.  She is planning a trip to [place]  in July and expresses concern about traveling given her recent health issues. She is considering retiring at the end of the year due to stress from her current situation.  Her husband has been dealing with family stress, including the recent deaths of his siblings and caring for his 58 year old mother, which has added to her stress levels.       Challenges affecting patient progress: Recent lapse due to medical illness.    Pharmacotherapy for weight management: She is currently taking Zonisamide  (off label use, single agent) with adequate clinical response  and without side effects..   Assessment and Plan   Treatment Plan For Obesity:  Recommended Dietary Goals  Janice Hutchinson is currently in the action stage of change. As such, her goal is to continue weight management plan. She has agreed to: continue current plan  Behavioral Health and Counseling  We discussed the following behavioral modification strategies today: continue to work on maintaining a reduced calorie state, getting the recommended amount of protein, incorporating whole foods, making healthy choices, staying well hydrated and practicing mindfulness when eating..  Additional education and resources provided today: None  Recommended Physical Activity Goals  Janice Hutchinson has been advised to work up to 150 minutes of moderate intensity aerobic activity a week and strengthening exercises 2-3 times per week for cardiovascular health, weight loss maintenance and preservation of muscle mass.   She has  agreed to :  Start light physical activity once okayed by neurology  Pharmacotherapy  We discussed various medication options to help Janice Hutchinson with her weight loss efforts and we both agreed to : Adequate clinical response to anti-obesity medication, continue current regimen  Associated Conditions Impacted by Obesity  Treatment  TIA (transient ischemic attack)  Class 2 obesity without serious comorbidity with body mass index (BMI) of 35.0 to 35.9 in adult, unspecified obesity type -     Zonisamide ; Take 1 capsule (25 mg total) by mouth daily.  Dispense: 90 capsule; Refill: 0  Hypertension, essential     Assessment and Plan    Transient Ischemic Attack (TIA) Experienced a TIA on April 29th, characterized by left-sided weakness and aphasia. Imaging studies (CT, MRI, angiogram) showed no clots. Neurologist suggested proximity to speech center. Undergoing speech therapy. Risk of recurrence within 30 days. Aspirin  and Plavix  initiated, Lipitor increased to 40 mg daily. Immediate medical attention advised if symptoms recur due to four-hour intervention window. - Continue aspirin   - Continue speech therapy - Seek immediate medical attention if recurrent symptoms occur - Monitor blood pressure at home in the morning and before bedtime for goal blood pressure of less than 130/80.  Hypertension Blood pressure is labile, with recent readings of 133/82 mmHg. Control is crucial to reduce stroke risk. Current medications: bisoprolol , losartan, amlodipine . Advised to monitor blood pressure three times daily for a week to assess control and medication efficacy. Importance of maintaining blood pressure under 130/80 mmHg consistently discussed. Non-dipping blood pressure at night increases stroke risk. - Monitor blood pressure three times daily for one week - Share blood pressure readings with Dr. [name] for potential medication adjustment - Continue current antihypertensive regimen - Ensure nocturnal blood pressure reduction to mitigate stroke risk  Fibromuscular Dysplasia Suspicion based on vertebral artery findings. Follow-up with repeat CTA in one year planned. - Repeat CTA in one year  Back Pain Chronic back pain with previous physical therapy deemed ineffective. Surgery recommended by Dr. Andy Bannister but deferred  for at least three months post-TIA. Prednisone  has been prescribed by her provider.  Advised to avoid NSAIDs because of recent stroke and elevations in blood pressure.  General Health Maintenance Emphasized healthy lifestyle, including diet and exercise, for weight and overall health management. Importance of blood pressure control and medication adherence highlighted. Advised to focus on recovery, stay active, and ensure adequate protein intake without skipping meals. - Focus on recovery and staying active - Ensure adequate protein intake and avoid skipping meals  Follow-up Plans for follow-up after upcoming trip to [place] . Importance of blood pressure control before travel emphasized. Advised to maintain current medication regimen and monitor symptoms closely during travel. - Schedule follow-up appointment after return from [place]  trip in July        Objective   Physical Exam:  Blood pressure 138/78, pulse (!) 52, temperature 97.6 F (36.4 C), height 5\' 6"  (1.676 m), weight 193 lb (87.5 kg), SpO2 99%. Body mass index is 31.15 kg/m.  General: She is overweight, cooperative, alert, well developed, and in no acute distress. PSYCH: Has normal mood, affect and thought process.   HEENT: EOMI, sclerae are anicteric. Lungs: Normal breathing effort, no conversational dyspnea. Extremities: No edema.  Neurologic: No gross sensory or motor deficits. No tremors or fasciculations noted.    Diagnostic Data Reviewed:  BMET    Component Value Date/Time   NA 138 11/25/2023 0544   NA 140 02/03/2023 0000   K 3.8 11/25/2023 0544  CL 109 11/25/2023 0544   CO2 20 (L) 11/25/2023 0544   GLUCOSE 92 11/25/2023 0544   BUN 16 11/25/2023 0544   BUN 18 02/03/2023 0000   CREATININE 1.09 (H) 11/25/2023 0544   CALCIUM  9.3 11/25/2023 0544   GFRNONAA 54 (L) 11/25/2023 0544   GFRAA >60 03/17/2017 1048   Lab Results  Component Value Date   HGBA1C 4.6 (L) 11/24/2023   HGBA1C 4.7 (L) 05/29/2022   Lab  Results  Component Value Date   INSULIN  15.0 12/01/2022   INSULIN  12.1 05/01/2022   Lab Results  Component Value Date   TSH 1.05 02/03/2023   CBC    Component Value Date/Time   WBC 4.0 11/25/2023 0544   RBC 5.49 (H) 11/25/2023 0544   HGB 14.3 11/25/2023 0544   HGB 15.3 05/29/2022 1600   HCT 43.8 11/25/2023 0544   HCT 47.4 (H) 05/29/2022 1600   PLT 214 11/25/2023 0544   MCV 79.8 (L) 11/25/2023 0544   MCV 80 05/29/2022 1600   MCH 26.0 11/25/2023 0544   MCHC 32.6 11/25/2023 0544   RDW 15.0 11/25/2023 0544   RDW 14.3 05/29/2022 1600   Iron Studies No results found for: "IRON", "TIBC", "FERRITIN", "IRONPCTSAT" Lipid Panel     Component Value Date/Time   CHOL 176 11/24/2023 0619   CHOL 181 05/01/2022 1222   TRIG 71 11/24/2023 0619   HDL 64 11/24/2023 0619   HDL 52 05/01/2022 1222   CHOLHDL 2.8 11/24/2023 0619   VLDL 14 11/24/2023 0619   LDLCALC 98 11/24/2023 0619   LDLCALC 112 (H) 05/01/2022 1222   Hepatic Function Panel     Component Value Date/Time   PROT 6.7 11/25/2023 0544   PROT 7.4 05/29/2022 1600   ALBUMIN 3.3 (L) 11/25/2023 0544   ALBUMIN 4.2 05/29/2022 1600   AST 16 11/25/2023 0544   ALT 20 11/25/2023 0544   ALKPHOS 68 11/25/2023 0544   BILITOT 1.9 (H) 11/25/2023 0544   BILITOT 0.8 05/29/2022 1600   BILIDIR 0.2 11/24/2023 1230   IBILI 1.0 (H) 11/24/2023 1230      Component Value Date/Time   TSH 1.05 02/03/2023 0000   TSH 0.58 05/04/2014 1005   Nutritional Lab Results  Component Value Date   VD25OH 45.5 12/01/2022   VD25OH 14.1 (L) 05/29/2022   VD25OH 28.71 (L) 05/04/2014    Medications: Outpatient Encounter Medications as of 12/30/2023  Medication Sig   [Paused] amLODipine  (NORVASC ) 5 MG tablet Take 5 mg by mouth in the morning and at bedtime.   Artificial Tear Solution (SOOTHE XP OP) Place 1-2 drops into both eyes 3 (three) times daily as needed (dry/irritated eyes.).   aspirin  EC 81 MG tablet Take 1 tablet (81 mg total) by mouth daily.  Swallow whole.   atorvastatin  (LIPITOR) 40 MG tablet Take 1 tablet (40 mg total) by mouth daily.   [Paused] bisoprolol  (ZEBETA ) 10 MG tablet Take by mouth in the morning.   [Paused] losartan (COZAAR) 100 MG tablet Take 100 mg by mouth in the morning.   [DISCONTINUED] zonisamide  (ZONEGRAN ) 25 MG capsule Take 1 capsule (25 mg total) by mouth daily.   zonisamide  (ZONEGRAN ) 25 MG capsule Take 1 capsule (25 mg total) by mouth daily.   [DISCONTINUED] clopidogrel  (PLAVIX ) 75 MG tablet Take 1 tablet (75 mg total) by mouth daily. (Patient not taking: Reported on 12/22/2023)   Facility-Administered Encounter Medications as of 12/30/2023  Medication   Spy Agent Marrie Sizer / Firefly Optime     Follow-Up  Return in about 5 weeks (around 02/03/2024) for For Weight Mangement with Dr. Allie Area.Aaron Aas She was informed of the importance of frequent follow up visits to maximize her success with intensive lifestyle modifications for her multiple health conditions.  Attestation Statement   Reviewed by clinician on day of visit: allergies, medications, problem list, medical history, surgical history, family history, social history, and previous encounter notes.     Ladd Picker, MD

## 2024-01-06 DIAGNOSIS — R471 Dysarthria and anarthria: Secondary | ICD-10-CM | POA: Diagnosis not present

## 2024-01-19 DIAGNOSIS — R471 Dysarthria and anarthria: Secondary | ICD-10-CM | POA: Diagnosis not present

## 2024-02-15 ENCOUNTER — Ambulatory Visit (INDEPENDENT_AMBULATORY_CARE_PROVIDER_SITE_OTHER): Admitting: Internal Medicine

## 2024-02-15 ENCOUNTER — Encounter (INDEPENDENT_AMBULATORY_CARE_PROVIDER_SITE_OTHER): Payer: Self-pay | Admitting: Internal Medicine

## 2024-02-15 ENCOUNTER — Telehealth (INDEPENDENT_AMBULATORY_CARE_PROVIDER_SITE_OTHER): Payer: Self-pay

## 2024-02-15 ENCOUNTER — Other Ambulatory Visit (INDEPENDENT_AMBULATORY_CARE_PROVIDER_SITE_OTHER): Payer: Self-pay | Admitting: Internal Medicine

## 2024-02-15 VITALS — BP 138/84 | HR 57 | Temp 97.5°F | Ht 66.0 in | Wt 195.0 lb

## 2024-02-15 DIAGNOSIS — I10 Essential (primary) hypertension: Secondary | ICD-10-CM | POA: Diagnosis not present

## 2024-02-15 DIAGNOSIS — E7800 Pure hypercholesterolemia, unspecified: Secondary | ICD-10-CM

## 2024-02-15 DIAGNOSIS — R7303 Prediabetes: Secondary | ICD-10-CM

## 2024-02-15 DIAGNOSIS — I69328 Other speech and language deficits following cerebral infarction: Secondary | ICD-10-CM | POA: Diagnosis not present

## 2024-02-15 DIAGNOSIS — G459 Transient cerebral ischemic attack, unspecified: Secondary | ICD-10-CM

## 2024-02-15 DIAGNOSIS — E66811 Obesity, class 1: Secondary | ICD-10-CM

## 2024-02-15 DIAGNOSIS — I6930 Unspecified sequelae of cerebral infarction: Secondary | ICD-10-CM | POA: Insufficient documentation

## 2024-02-15 DIAGNOSIS — Z6830 Body mass index (BMI) 30.0-30.9, adult: Secondary | ICD-10-CM

## 2024-02-15 MED ORDER — WEGOVY 0.25 MG/0.5ML ~~LOC~~ SOAJ: 0.2500 mg | SUBCUTANEOUS | 0 refills | Status: DC

## 2024-02-15 NOTE — Telephone Encounter (Signed)
 Prior auth submitted for Wegovy :  cardiac Awaiting determination.

## 2024-02-15 NOTE — Assessment & Plan Note (Signed)
 Janice Hutchinson is undergoing medical weight management with favorable changes in body composition, including decreased body fat percentage and increased muscle mass. She has lost approximately 20 pounds on a structured nutritional plan and zonisamide , an off-label anti-obesity medication. She is actively engaging in physical activity and dietary monitoring. Discontinuation of zonisamide  is planned due to potential word-finding issues, and Wegovy  (semaglutide ) is being considered as an alternative. Wegovy  offers benefits for weight loss, blood pressure reduction, and cardiovascular risk reduction, with potential side effects including gastrointestinal symptoms. Long-term use is necessary to maintain weight loss and achieve cardiovascular benefits. - Taper zonisamide  to 25 mg every other day for 7 days, then discontinue - Submit prior authorization for Wegovy  (semaglutide ) to insurance - Educate Janice Hutchinson on Wegovy , including its benefits for weight loss, blood pressure, cardiovascular risk reduction, and potential side effects such as nausea, upset stomach, diarrhea, and constipation

## 2024-02-15 NOTE — Assessment & Plan Note (Signed)
 Currently on moderate intensity statin therapy with atorvastatin  40 mg a day.  No adverse effects reported.  Continue current dose.

## 2024-02-15 NOTE — Assessment & Plan Note (Signed)
 We reviewed goals of care.  Blood pressure goal less than 130/80.Janice Hutchinson has essential hypertension, managed with losartan, bisoprolol , and amlodipine . Her blood pressure today is 138/84 mmHg, and she reports it is well-controlled at home. Wegovy  may help lower blood pressure and reduce cardiovascular risk. - Continue losartan, bisoprolol , and amlodipine  - Monitor blood pressure at home

## 2024-02-15 NOTE — Assessment & Plan Note (Signed)
 Her A1c has improved and down to 4.6 due to nutritional and behavioral strategies.  She had been on Mounjaro  briefly in the past.  She would benefit from GLP-1 for pharmacoprophylaxis.

## 2024-02-15 NOTE — Assessment & Plan Note (Signed)
 Janice Hutchinson is experiencing residual speech issues, suggesting a stroke rather than a TIA. She reports memory issues, vision changes, and occasional headaches. She had left-sided weakness, which has resolved, but continues to have word-finding difficulties. Discontinuation of zonisamide  is planned to assess if word-finding issues are related to medication or stroke. - Discontinue zonisamide  to assess if word-finding issues are related to medication or stroke - Continue speech therapy  - Continue current medications: losartan, bisoprolol , amlodipine , baby aspirin , atorvastatin  - Monitor blood pressure at home for goal of less than 130/80

## 2024-02-15 NOTE — Progress Notes (Signed)
 Office: [phone]  /  Fax: [phone]  Weight Summary and Body Composition Analysis (BIA)  Vitals Temp: (!) 97.5 F (36.4 C) BP: 138/84 Pulse Rate: (!) 57 SpO2: 97 %   Anthropometric Measurements Height: 5' 6 (1.676 m) Weight: 195 lb (88.5 kg) BMI (Calculated): 31.49 Weight at Last Visit: 193 lb Weight Lost Since Last Visit: 0 lb Weight Gained Since Last Visit: 2 lb Starting Weight: 215 lb Total Weight Loss (lbs): 20 lb (9.072 kg) Peak Weight: 217 lb   Body Composition  Body Fat %: 40.3 % Fat Mass (lbs): 78.8 lbs Muscle Mass (lbs): 110.8 lbs Total Body Water  (lbs): 73.4 lbs Visceral Fat Rating : 12    RMR: 1310  Today's Visit #: 22  Starting Date: 05/11/22   Subjective   Chief Complaint: Obesity  Interval History Discussed the use of AI scribe software for clinical note transcription with the patient, who gave verbal consent to proceed.  History of Present Illness   Janice Hutchinson is a 73 year old female who presents for medical weight management.  She has a history of essential hypertension, prediabetes, hyperlipidemia, and a stroke in April of this year where she had acute onset of left-sided weakness.  She has recovered strength on the left side of her body but still having some problems with her speech she is receiving speech therapy.  She recently traveled to [place] , where she engaged in walking and maintained a balanced diet, despite occasional indulgences. She notes a decrease in dress size and an improvement in body composition, with a reduction in body fat and an increase in muscle mass.  Regarding her stroke, she experiences residual issues with memory and vision, and occasional difficulty with word finding. She completed speech therapy sessions before her trip and continues to work on her speech. She checks her blood pressure regularly, especially when experiencing headaches. No recent issues with left-sided weakness.  Her current  medications include losartan 100 mg, bisoprolol , amlodipine , a baby aspirin , atorvastatin , and senesemide, which she has been using for weight management.       Challenges affecting patient progress: none.    Pharmacotherapy for weight management: She is currently taking Zonisamide  (off label use, single agent) with adequate clinical response  and without side effects..   Assessment and Plan   Treatment Plan For Obesity:  Recommended Dietary Goals  Alaysia is currently in the action stage of change. As such, her goal is to continue weight management plan. She has agreed to: continue current plan  Behavioral Health and Counseling  We discussed the following behavioral modification strategies today: continue to work on maintaining a reduced calorie state, getting the recommended amount of protein, incorporating whole foods, making healthy choices, staying well hydrated and practicing mindfulness when eating..  Additional education and resources provided today: None  Recommended Physical Activity Goals  Marcayla has been advised to work up to 150 minutes of moderate intensity aerobic activity a week and strengthening exercises 2-3 times per week for cardiovascular health, weight loss maintenance and preservation of muscle mass.   She has agreed to :  Think about enjoyable ways to increase daily physical activity and overcoming barriers to exercise and Increase physical activity in their day and reduce sedentary time (increase NEAT).  Medical Interventions and Pharmacotherapy  We discussed various medication options to help Faren with her weight loss efforts and we both agreed to : Start anti-obesity medication.  In addition to reduced calorie nutrition plan (RCNP), behavioral strategies and physical  activity, Loys would benefit from pharmacotherapy to assist with hunger signals, satiety and cravings. This will reduce obesity-related health risks by inducing weight loss, and help reduce  food consumption and adherence to [facility]) . It may also improve QOL by improving self-confidence and reduce the  setbacks associated with metabolic adaptations.  Lonna also has high risk obesity related medical conditions these include prediabetes, hypertension, hypercholesterolemia and had a stroke on April of this year with some residual speech difficulties.  She also has chronic lower back pain due to lumbar spondylosis.  Patient benefits from GLP-1 therapy to reduce cardiovascular risk.  She is on antiplatelet therapy, blood pressure medications and moderate intensity statin therapy.  She has been on zonisamide  off label for antiobesity treatment but because of problems with word finding we will discontinue medication.  After discussion of treatment options, mechanisms of action, benefits, side effects, contraindications and shared decision making she is agreeable to starting Wegovy  0.25 mg once a week. Patient also made aware that medication is indicated for long-term management of obesity and the risk of weight regain following discontinuation of treatment and hence the importance of adhering to medical weight loss plan.  We demonstrated use of device and patient using teach back method was able to demonstrate proper technique.  Associated Conditions Impacted by Obesity Treatment  Assessment & Plan Hypertension, essential We reviewed goals of care.  Blood pressure goal less than 130/80.Regnia has essential hypertension, managed with losartan, bisoprolol , and amlodipine . Her blood pressure today is 138/84 mmHg, and she reports it is well-controlled at home. Wegovy  may help lower blood pressure and reduce cardiovascular risk. - Continue losartan, bisoprolol , and amlodipine  - Monitor blood pressure at home Pre-diabetes Her A1c has improved and down to 4.6 due to nutritional and behavioral strategies.  She had been on Mounjaro  briefly in the past.  She would benefit from GLP-1 for  pharmacoprophylaxis. Class 1 obesity with serious comorbidity and body mass index (BMI) of 30.0 to 30.9 in adult, unspecified obesity type Cydnee is undergoing medical weight management with favorable changes in body composition, including decreased body fat percentage and increased muscle mass. She has lost approximately 20 pounds on a structured nutritional plan and zonisamide , an off-label anti-obesity medication. She is actively engaging in physical activity and dietary monitoring. Discontinuation of zonisamide  is planned due to potential word-finding issues, and Wegovy  (semaglutide ) is being considered as an alternative. Wegovy  offers benefits for weight loss, blood pressure reduction, and cardiovascular risk reduction, with potential side effects including gastrointestinal symptoms. Long-term use is necessary to maintain weight loss and achieve cardiovascular benefits. - Taper zonisamide  to 25 mg every other day for 7 days, then discontinue - Submit prior authorization for Wegovy  (semaglutide ) to insurance - Educate Juliza on Wegovy , including its benefits for weight loss, blood pressure, cardiovascular risk reduction, and potential side effects such as nausea, upset stomach, diarrhea, and constipation Hypercholesteremia Currently on moderate intensity statin therapy with atorvastatin  40 mg a day.  No adverse effects reported.  Continue current dose. History of stroke with residual effects - 10/2023, problems with speech,  recovered left sided weakness Page is experiencing residual speech issues, suggesting a stroke rather than a TIA. She reports memory issues, vision changes, and occasional headaches. She had left-sided weakness, which has resolved, but continues to have word-finding difficulties. Discontinuation of zonisamide  is planned to assess if word-finding issues are related to medication or stroke. - Discontinue zonisamide  to assess if word-finding issues are related to medication or stroke -  Continue  speech therapy  - Continue current medications: losartan, bisoprolol , amlodipine , baby aspirin , atorvastatin  - Monitor blood pressure at home for goal of less than 130/80         Objective   Physical Exam:  Blood pressure 138/84, pulse (!) 57, temperature (!) 97.5 F (36.4 C), height 5' 6 (1.676 m), weight 195 lb (88.5 kg), SpO2 97%. Body mass index is 31.47 kg/m.  General: She is overweight, cooperative, alert, well developed, and in no acute distress. PSYCH: Has normal mood, affect and thought process.   HEENT: EOMI, sclerae are anicteric. Lungs: Normal breathing effort, no conversational dyspnea. Extremities: No edema.  Neurologic: No gross sensory or motor deficits. No tremors or fasciculations noted.    Diagnostic Data Reviewed:  BMET    Component Value Date/Time   NA 138 11/25/2023 0544   NA 140 02/03/2023 0000   K 3.8 11/25/2023 0544   CL 109 11/25/2023 0544   CO2 20 (L) 11/25/2023 0544   GLUCOSE 92 11/25/2023 0544   BUN 16 11/25/2023 0544   BUN 18 02/03/2023 0000   CREATININE 1.09 (H) 11/25/2023 0544   CALCIUM  9.3 11/25/2023 0544   GFRNONAA 54 (L) 11/25/2023 0544   GFRAA >60 03/17/2017 1048   Lab Results  Component Value Date   HGBA1C 4.6 (L) 11/24/2023   HGBA1C 4.7 (L) 05/29/2022   Lab Results  Component Value Date   INSULIN  15.0 12/01/2022   INSULIN  12.1 05/01/2022   Lab Results  Component Value Date   TSH 1.05 02/03/2023   CBC    Component Value Date/Time   WBC 4.0 11/25/2023 0544   RBC 5.49 (H) 11/25/2023 0544   HGB 14.3 11/25/2023 0544   HGB 15.3 05/29/2022 1600   HCT 43.8 11/25/2023 0544   HCT 47.4 (H) 05/29/2022 1600   PLT 214 11/25/2023 0544   MCV 79.8 (L) 11/25/2023 0544   MCV 80 05/29/2022 1600   MCH 26.0 11/25/2023 0544   MCHC 32.6 11/25/2023 0544   RDW 15.0 11/25/2023 0544   RDW 14.3 05/29/2022 1600   Iron Studies No results found for: IRON, TIBC, FERRITIN, IRONPCTSAT Lipid Panel     Component Value  Date/Time   CHOL 176 11/24/2023 0619   CHOL 181 05/01/2022 1222   TRIG 71 11/24/2023 0619   HDL 64 11/24/2023 0619   HDL 52 05/01/2022 1222   CHOLHDL 2.8 11/24/2023 0619   VLDL 14 11/24/2023 0619   LDLCALC 98 11/24/2023 0619   LDLCALC 112 (H) 05/01/2022 1222   Hepatic Function Panel     Component Value Date/Time   PROT 6.7 11/25/2023 0544   PROT 7.4 05/29/2022 1600   ALBUMIN 3.3 (L) 11/25/2023 0544   ALBUMIN 4.2 05/29/2022 1600   AST 16 11/25/2023 0544   ALT 20 11/25/2023 0544   ALKPHOS 68 11/25/2023 0544   BILITOT 1.9 (H) 11/25/2023 0544   BILITOT 0.8 05/29/2022 1600   BILIDIR 0.2 11/24/2023 1230   IBILI 1.0 (H) 11/24/2023 1230      Component Value Date/Time   TSH 1.05 02/03/2023 0000   TSH 0.58 05/04/2014 1005   Nutritional Lab Results  Component Value Date   VD25OH 45.5 12/01/2022   VD25OH 14.1 (L) 05/29/2022   VD25OH 28.71 (L) 05/04/2014    Medications: Outpatient Encounter Medications as of 02/15/2024  Medication Sig   [Paused] amLODipine  (NORVASC ) 5 MG tablet Take 5 mg by mouth in the morning and at bedtime.   Artificial Tear Solution (SOOTHE XP OP) Place 1-2 drops into both  eyes 3 (three) times daily as needed (dry/irritated eyes.).   aspirin  EC 81 MG tablet Take 1 tablet (81 mg total) by mouth daily. Swallow whole.   atorvastatin  (LIPITOR) 40 MG tablet Take 1 tablet (40 mg total) by mouth daily.   [Paused] bisoprolol  (ZEBETA ) 10 MG tablet Take by mouth in the morning.   [Paused] losartan (COZAAR) 100 MG tablet Take 100 mg by mouth in the morning.   zonisamide  (ZONEGRAN ) 25 MG capsule Take 1 capsule (25 mg total) by mouth daily.   Facility-Administered Encounter Medications as of 02/15/2024  Medication   Spy Agent Landy / Firefly Optime     Follow-Up   No follow-ups on file.SABRA She was informed of the importance of frequent follow up visits to maximize her success with intensive lifestyle modifications for her multiple health conditions.  Attestation  Statement   Reviewed by clinician on day of visit: allergies, medications, problem list, medical history, surgical history, family history, social history, and previous encounter notes.     Lucas Parker, MD

## 2024-02-16 ENCOUNTER — Telehealth (INDEPENDENT_AMBULATORY_CARE_PROVIDER_SITE_OTHER): Payer: Self-pay | Admitting: Internal Medicine

## 2024-02-16 NOTE — Telephone Encounter (Signed)
 Patient called in stating eve with the prior approval for the Wegovy  her copay is going to be $600, therefore she will not be getting the Wegovy . Also she want to know if she is still supposed to taper her zonisamide .

## 2024-02-16 NOTE — Telephone Encounter (Signed)
 Spoke to Horine and advised to take one pill every other day for seven days

## 2024-02-16 NOTE — Telephone Encounter (Signed)
 Your prior authorization for Wegovy  has been approved! More Info Personalized support and financial assistance may be available through the [employer] program. For more information, and to see program requirements, click on the More Info button to the right.  Message from plan: Request Reference Number: [id number]. WEGOVY  INJ 0.25MG  is approved through 07/27/2024. Your patient may now fill this prescription and it will be covered.. Authorization Expiration Date: July 27, 2024.

## 2024-03-14 ENCOUNTER — Encounter (INDEPENDENT_AMBULATORY_CARE_PROVIDER_SITE_OTHER): Payer: Self-pay | Admitting: Internal Medicine

## 2024-03-14 ENCOUNTER — Ambulatory Visit (INDEPENDENT_AMBULATORY_CARE_PROVIDER_SITE_OTHER): Admitting: Internal Medicine

## 2024-03-14 VITALS — BP 136/81 | HR 50 | Temp 97.9°F | Ht 66.0 in | Wt 195.0 lb

## 2024-03-14 DIAGNOSIS — R7303 Prediabetes: Secondary | ICD-10-CM

## 2024-03-14 DIAGNOSIS — Z6830 Body mass index (BMI) 30.0-30.9, adult: Secondary | ICD-10-CM

## 2024-03-14 DIAGNOSIS — R609 Edema, unspecified: Secondary | ICD-10-CM | POA: Diagnosis not present

## 2024-03-14 DIAGNOSIS — I10 Essential (primary) hypertension: Secondary | ICD-10-CM

## 2024-03-14 DIAGNOSIS — I6930 Unspecified sequelae of cerebral infarction: Secondary | ICD-10-CM

## 2024-03-14 DIAGNOSIS — I69328 Other speech and language deficits following cerebral infarction: Secondary | ICD-10-CM | POA: Diagnosis not present

## 2024-03-14 DIAGNOSIS — E66811 Obesity, class 1: Secondary | ICD-10-CM

## 2024-03-14 NOTE — Progress Notes (Signed)
 Office: [phone]  /  Fax: [phone]  Weight Summary and Body Composition Analysis (BIA)  Vitals Temp: 97.9 F (36.6 C) BP: 136/81 Pulse Rate: (!) 50 SpO2: 97 %   Anthropometric Measurements Height: 5' 6 (1.676 m) Weight: 195 lb (88.5 kg) BMI (Calculated): 31.49 Weight at Last Visit: 195 lb Weight Lost Since Last Visit: 0 lb Weight Gained Since Last Visit: 1 lb Starting Weight: 215 lb Total Weight Loss (lbs): 19 lb (8.618 kg) Peak Weight: 217 lb   Body Composition  Body Fat %: 44.4 % Fat Mass (lbs): 87.2 lbs Muscle Mass (lbs): 103.4 lbs Total Body Water  (lbs): 73.4 lbs Visceral Fat Rating : 13    RMR: 1310  Today's Visit #: 23  Starting Date: 05/11/22   Subjective   Chief Complaint: Obesity  Interval History Discussed the use of AI scribe software for clinical note transcription with the patient, who gave verbal consent to proceed.  History of Present Illness   Janice Hutchinson is a 73 year old female with hypertension and prediabetes who presents for medical weight management.  She previously attempted weight loss with Wegovy  but discontinued due to high copay costs. She has been off her previous medication without significant changes in appetite or weight. Her appetite has not increased since stopping the medication, and she is managing her weight through diet and exercise.  She occasionally consumes protein shakes for breakfast or lunch but not consistently. She attends the gym, although her attendance was interrupted by jury duty last week. She notes swollen ankles.  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 Stress affects her appetite, often leading her to eat less. She engages in activities like canning and making jellies to keep her mind occupied.  She is concerned about her weight management in relation to  her hypertension and prediabetes. She is trying to eat more fruits and vegetables and avoid fried foods. She is frustrated with her weight loss progress, noting difficulty in eating three meals consistently and often skipping meals when not hungry.       Challenges affecting patient progress: slow metabolism for age, menopause, and weight loss plateau.    Pharmacotherapy for weight management: She is currently taking no anti-obesity medication and she had been on Mounjaro  in the past was not covered by her insurance after a few months.  She was on zonisamide  but medication was discontinued due to problems with word finding following her stroke..   Assessment and Plan   Treatment Plan For Obesity:  Recommended Dietary Goals  Alyannah is currently in the action stage of change. As such, her goal is to continue weight management plan. She has agreed to: continue current plan  Behavioral Health and Counseling  We discussed the following behavioral modification strategies today: continue to work on maintaining a reduced calorie state, getting the recommended amount of protein, incorporating whole foods, making healthy choices, staying well hydrated and practicing mindfulness when eating..  Additional education and resources provided today: None  Recommended Physical Activity Goals  Makensie has been advised to work up to 150 minutes of moderate intensity aerobic activity a week and strengthening exercises 2-3 times per week for cardiovascular health, weight loss maintenance and preservation of muscle mass.   She has agreed to :  continue to gradually increase the amount and intensity of exercise routine  Medical Interventions and Pharmacotherapy  We  discussed various medication options to help Ieesha with her weight loss efforts and we both agreed to : Continue with current nutritional and behavioral strategies and GLP-1 treatment is cost prohibitive and not covered by her  insurance  Associated Conditions Impacted by Obesity Treatment  Assessment & Plan Essential hypertension  Class 1 obesity with serious comorbidity and body mass index (BMI) of 30.0 to 30.9 in adult, unspecified obesity type  History of stroke with residual effects - 10/2023, problems with speech,  recovered left sided weakness  Prediabetes     Assessment and Plan    Obesity with slow metabolic rate Obesity with a slow metabolic rate, contributing to difficulty in weight loss despite lifestyle changes. She has lost 10% of her weight over nearly two years, which is the expected outcome with lifestyle modifications alone. The slow metabolic rate is likely contributing to the lack of hunger and difficulty in further weight loss. She is not experiencing increased hunger after stopping previous medication. GLP-1 medications like Wegovy  could aid in weight loss and reduce cardiovascular and diabetes risks, but cost and insurance coverage are barriers. Emphasized the importance of protein intake and physical activity to boost metabolism. - Repeat metabolic rate test in four weeks to assess current metabolic rate. - Implement consistent use of meal replacements, such as protein shakes, for two weeks to reduce calorie intake. - Encourage intake of 90 grams of protein daily through balanced meals. - Consider intermittent fasting as a potential strategy for weight management. - Continue physical activity. - Contact insurance to inquire about coverage for Mounjaro  for prediabetes.  Prediabetes Prediabetes, which may benefit from weight loss and lifestyle modifications. GLP-1 medications could potentially prevent progression to diabetes, but insurance coverage is a barrier. - Check A1c and insulin  levels during upcoming lab work.  Hypertension Hypertension, which could potentially be improved with weight loss and the use of GLP-1 medications. However, cost and insurance coverage are barriers to  medication use.  Lower extremity edema Mild lower extremity edema, possibly related to fluid retention rather than weight gain.        Objective   Physical Exam:  Blood pressure 136/81, pulse (!) 50, temperature 97.9 F (36.6 C), height 5' 6 (1.676 m), weight 195 lb (88.5 kg), SpO2 97%. Body mass index is 31.47 kg/m.  General: She is overweight, cooperative, alert, well developed, and in no acute distress. PSYCH: Has normal mood, affect and thought process.   HEENT: EOMI, sclerae are anicteric. Lungs: Normal breathing effort, no conversational dyspnea. Extremities: No edema.  Neurologic: No gross sensory or motor deficits. No tremors or fasciculations noted.    Diagnostic Data Reviewed:  BMET    Component Value Date/Time   NA 138 11/25/2023 0544   NA 140 02/03/2023 0000   K 3.8 11/25/2023 0544   CL 109 11/25/2023 0544   CO2 20 (L) 11/25/2023 0544   GLUCOSE 92 11/25/2023 0544   BUN 16 11/25/2023 0544   BUN 18 02/03/2023 0000   CREATININE 1.09 (H) 11/25/2023 0544   CALCIUM  9.3 11/25/2023 0544   GFRNONAA 54 (L) 11/25/2023 0544   GFRAA >60 03/17/2017 1048   Lab Results  Component Value Date   HGBA1C 4.6 (L) 11/24/2023   HGBA1C 4.7 (L) 05/29/2022   Lab Results  Component Value Date   INSULIN  15.0 12/01/2022   INSULIN  12.1 05/01/2022   Lab Results  Component Value Date   TSH 1.05 02/03/2023   CBC    Component Value Date/Time   WBC 4.0  11/25/2023 0544   RBC 5.49 (H) 11/25/2023 0544   HGB 14.3 11/25/2023 0544   HGB 15.3 05/29/2022 1600   HCT 43.8 11/25/2023 0544   HCT 47.4 (H) 05/29/2022 1600   PLT 214 11/25/2023 0544   MCV 79.8 (L) 11/25/2023 0544   MCV 80 05/29/2022 1600   MCH 26.0 11/25/2023 0544   MCHC 32.6 11/25/2023 0544   RDW 15.0 11/25/2023 0544   RDW 14.3 05/29/2022 1600   Iron Studies No results found for: IRON, TIBC, FERRITIN, IRONPCTSAT Lipid Panel     Component Value Date/Time   CHOL 176 11/24/2023 0619   CHOL 181 05/01/2022  1222   TRIG 71 11/24/2023 0619   HDL 64 11/24/2023 0619   HDL 52 05/01/2022 1222   CHOLHDL 2.8 11/24/2023 0619   VLDL 14 11/24/2023 0619   LDLCALC 98 11/24/2023 0619   LDLCALC 112 (H) 05/01/2022 1222   Hepatic Function Panel     Component Value Date/Time   PROT 6.7 11/25/2023 0544   PROT 7.4 05/29/2022 1600   ALBUMIN 3.3 (L) 11/25/2023 0544   ALBUMIN 4.2 05/29/2022 1600   AST 16 11/25/2023 0544   ALT 20 11/25/2023 0544   ALKPHOS 68 11/25/2023 0544   BILITOT 1.9 (H) 11/25/2023 0544   BILITOT 0.8 05/29/2022 1600   BILIDIR 0.2 11/24/2023 1230   IBILI 1.0 (H) 11/24/2023 1230      Component Value Date/Time   TSH 1.05 02/03/2023 0000   TSH 0.58 05/04/2014 1005   Nutritional Lab Results  Component Value Date   VD25OH 45.5 12/01/2022   VD25OH 14.1 (L) 05/29/2022   VD25OH 28.71 (L) 05/04/2014    Medications: Outpatient Encounter Medications as of 03/14/2024  Medication Sig   amLODipine  (NORVASC ) 5 MG tablet Take 5 mg by mouth in the morning and at bedtime.   Artificial Tear Solution (SOOTHE XP OP) Place 1-2 drops into both eyes 3 (three) times daily as needed (dry/irritated eyes.).   aspirin  EC 81 MG tablet Take 1 tablet (81 mg total) by mouth daily. Swallow whole.   atorvastatin  (LIPITOR) 40 MG tablet Take 1 tablet (40 mg total) by mouth daily.   bisoprolol  (ZEBETA ) 10 MG tablet Take by mouth in the morning.   losartan (COZAAR) 100 MG tablet Take 100 mg by mouth in the morning.   [DISCONTINUED] Semaglutide -Weight Management (WEGOVY ) 0.25 MG/0.5ML SOAJ Inject 0.25 mg into the skin once a week.   [DISCONTINUED] zonisamide  (ZONEGRAN ) 25 MG capsule Take 1 capsule (25 mg total) by mouth daily.   Facility-Administered Encounter Medications as of 03/14/2024  Medication   Spy Agent Green / Firefly Optime     Follow-Up   Return in about 4 weeks (around 04/11/2024) for For Weight Mangement with Dr. Francyne, Fasting and 30 minutes early for IC.SABRA She was informed of the importance  of frequent follow up visits to maximize her success with intensive lifestyle modifications for her multiple health conditions.  Attestation Statement   Reviewed by clinician on day of visit: allergies, medications, problem list, medical history, surgical history, family history, social history, and previous encounter notes.     Lucas Francyne, MD

## 2024-03-15 DIAGNOSIS — Z79899 Other long term (current) drug therapy: Secondary | ICD-10-CM | POA: Diagnosis not present

## 2024-03-15 DIAGNOSIS — R471 Dysarthria and anarthria: Secondary | ICD-10-CM | POA: Diagnosis not present

## 2024-03-15 DIAGNOSIS — M549 Dorsalgia, unspecified: Secondary | ICD-10-CM | POA: Diagnosis not present

## 2024-03-15 DIAGNOSIS — R7303 Prediabetes: Secondary | ICD-10-CM | POA: Diagnosis not present

## 2024-03-15 DIAGNOSIS — I10 Essential (primary) hypertension: Secondary | ICD-10-CM | POA: Diagnosis not present

## 2024-04-05 DIAGNOSIS — Z79899 Other long term (current) drug therapy: Secondary | ICD-10-CM | POA: Diagnosis not present

## 2024-04-05 DIAGNOSIS — R17 Unspecified jaundice: Secondary | ICD-10-CM | POA: Diagnosis not present

## 2024-04-07 DIAGNOSIS — H26493 Other secondary cataract, bilateral: Secondary | ICD-10-CM | POA: Diagnosis not present

## 2024-04-07 DIAGNOSIS — H53453 Other localized visual field defect, bilateral: Secondary | ICD-10-CM | POA: Diagnosis not present

## 2024-04-11 ENCOUNTER — Encounter (INDEPENDENT_AMBULATORY_CARE_PROVIDER_SITE_OTHER): Payer: Self-pay | Admitting: Internal Medicine

## 2024-04-11 ENCOUNTER — Ambulatory Visit (INDEPENDENT_AMBULATORY_CARE_PROVIDER_SITE_OTHER): Admitting: Internal Medicine

## 2024-04-11 VITALS — BP 134/76 | HR 57 | Temp 98.0°F | Ht 66.0 in | Wt 197.0 lb

## 2024-04-11 DIAGNOSIS — E66811 Obesity, class 1: Secondary | ICD-10-CM

## 2024-04-11 DIAGNOSIS — R0602 Shortness of breath: Secondary | ICD-10-CM

## 2024-04-11 DIAGNOSIS — R948 Abnormal results of function studies of other organs and systems: Secondary | ICD-10-CM | POA: Diagnosis not present

## 2024-04-11 DIAGNOSIS — I10 Essential (primary) hypertension: Secondary | ICD-10-CM | POA: Diagnosis not present

## 2024-04-11 DIAGNOSIS — Z6831 Body mass index (BMI) 31.0-31.9, adult: Secondary | ICD-10-CM

## 2024-04-11 DIAGNOSIS — I6930 Unspecified sequelae of cerebral infarction: Secondary | ICD-10-CM

## 2024-04-11 DIAGNOSIS — R944 Abnormal results of kidney function studies: Secondary | ICD-10-CM

## 2024-04-11 NOTE — Assessment & Plan Note (Signed)
 Obesity with metabolic adaptation (weight plateau) Janice Hutchinson is experiencing a metabolic slowdown, evidenced by a basal metabolic rate of 069 kcal/day, down from a previous 1310 kcal/day. This is likely due to metabolic adaptations following significant weight loss, compounded by the use of bisoprolol , a beta blocker known to slow metabolism. Despite adherence to dietary and exercise regimens, she is experiencing weight plateau, which is frustrating. - Switch bisoprolol  to a weight-neutral beta blocker such as nebivolol (Bystolic) or carvedilol. - Implement intermittent fasting with an 8-hour eating window, aiming for 1000 calories per day, 75 grams of protein, and 25 grams of fiber. - Encourage continuation of exercise regimen. - Reassure Selah about non-scale victories and encourage focus on overall health benefits. - Spoke with Dr. Lance after the visit and she will be working with patient to switch beta-blocker

## 2024-04-11 NOTE — Assessment & Plan Note (Signed)
 Her resting energy expenditure calculated by indirect calorimetry today was 994 in comparison to basal metabolic rate of 8490 her metabolic rate has gone down 33%.

## 2024-04-11 NOTE — Assessment & Plan Note (Signed)
 We reviewed goals of care.  Blood pressure goal less than 130/80.Janice Hutchinson has essential hypertension, managed with losartan, bisoprolol , and amlodipine . Her blood pressure today is slightly above goal and she reports it is well-controlled at home. - Continue losartan, bisoprolol , and amlodipine  - Work with primary care team to change to a weight neutral beta-blocker like Bystolic or carvedilol if beta-blockade is still needed.

## 2024-04-11 NOTE — Assessment & Plan Note (Signed)
 Patient is had fluctuations in her GFR.  With some low 60s high 50s. Chronic kidney disease is likely related to long-standing hypertension. Current medications, including losartan, may affect GFR but are necessary for blood pressure control.  - Ensure adequate hydration and avoid nephrotoxic medications such as NSAIDs. - Monitor renal function and adjust medications as necessary. -May benefit from GLP-1 treatment for nephro prophylaxis.

## 2024-04-11 NOTE — Assessment & Plan Note (Signed)
 Persistent speech disturbance and tongue paresthesia following a TIA. Symptoms have not improved since stopping semaglutide . Recent dull headaches noted, but blood pressure is well-controlled. - Follow up with neurosurgeon as scheduled.

## 2024-04-11 NOTE — Progress Notes (Signed)
 Office: [phone]  /  Fax: [phone]  Weight Summary and Body Composition Analysis (BIA)  Vitals Temp: 98 F (36.7 C) BP: 134/76 Pulse Rate: (!) 57 SpO2: 96 %   Anthropometric Measurements Height: 5' 6 (1.676 m) Weight: 197 lb (89.4 kg) BMI (Calculated): 31.81 Weight at Last Visit: 195 lb Weight Lost Since Last Visit: 0 lb Weight Gained Since Last Visit: 2 lb Starting Weight: 215 lb Total Weight Loss (lbs): 21 lb (9.526 kg) Peak Weight: 217 lb   Body Composition  Body Fat %: 44.6 % Fat Mass (lbs): 88 lbs Muscle Mass (lbs): 103.8 lbs Total Body Water  (lbs): 74.2 lbs Visceral Fat Rating : 13    RMR: 994  Today's Visit #: 24  Starting Date: 05/11/22   Subjective   Chief Complaint: Obesity  Interval History Discussed the use of AI scribe software for clinical note transcription with the patient, who gave verbal consent to proceed.  History of Present Illness Janice Hutchinson is a 73 year old female with hypertension, TIA, prediabetes, and hyperlipidemia who presents for medical weight management.  Her basal metabolic rate has decreased from 1310 to 930, as measured by indirect calorimetry. Despite adhering to a diet plan with meal substitutions and protein shakes, and maintaining regular exercise, she is frustrated by weight gain. She suspects her current medications, particularly bisoprolol , may be affecting her metabolism. She started bisoprolol  approximately five months after beginning her weight loss journey. She recently recorded a pulse rate of 49 and questions whether bisoprolol  might be contributing to her metabolic issues.  She has experienced neurological symptoms, including tingling on her tongue and stuttering, which she associates with her history of TIA. She also reports dull headaches and has been monitoring her blood pressure, which she states has been stable. No improvement in word finding or speech since stopping senesemide.  Her  dietary routine includes three meals a day, with occasional meal substitutions using protein shakes. She exercises regularly and is puzzled by her weight gain despite these efforts. Her husband's caregiving responsibilities for his mother have affected her meal schedule.  She has a history of high blood pressure and renal insufficiency, with recent lab results showing a GFR in the red zone. She has not used pain medications like Aleve or ibuprofen  since July.     Challenges affecting patient progress: slow metabolism for age, menopause, weight-centric mindset, and metabolic adaptations associated with weight loss.  Presence of beta-blocker   Pharmacotherapy for weight management: She is currently taking no anti-obesity medication and she had been on zonisamide , and briefly on GLP-1.  She had lost weight on GLP-1  Assessment and Plan   Treatment Plan For Obesity:  Recommended Dietary Goals  Janice Hutchinson is currently in the action stage of change. As such, her goal is to continue weight management plan. She has agreed to: Patient will follow a 1000-calorie plan which is either caloric based on her current REE.  We also discussed incorporating 16:8 TRE  Behavioral Health and Counseling  We discussed the following behavioral modification strategies today: I will like her to maintain and isolate caloric state we discussed having self compassion and avoiding a weight centric mindset.  Also to reflect on nonskilled accomplishments  Additional education and resources provided today: Weight management strategy plan  Recommended Physical Activity Goals  Janice Hutchinson has been advised to work up to 150 minutes of moderate intensity aerobic activity a week and strengthening exercises 2-3 times per week for cardiovascular health, weight loss maintenance and  preservation of muscle mass.  She has agreed to :  Increase volume of physical activity to a goal of 240 minutes a week and Combine aerobic and strengthening  exercises for efficiency and improved cardiometabolic health.  Medical Interventions and Pharmacotherapy  We discussed various medication options to help Janice Hutchinson with her weight loss efforts and we both agreed to : Continue with current nutritional and behavioral strategies  Associated Conditions Impacted by Obesity Treatment  Assessment & Plan SOB (shortness of breath) Abnormal metabolism Her resting energy expenditure calculated by indirect calorimetry today was 994 in comparison to basal metabolic rate of 8490 her metabolic rate has gone down 33%. Class 1 obesity with serious comorbidity and body mass index (BMI) of 30.0 to 30.9 in adult, unspecified obesity type Obesity with metabolic adaptation (weight plateau) Janice Hutchinson is experiencing a metabolic slowdown, evidenced by a basal metabolic rate of 069 kcal/day, down from a previous 1310 kcal/day. This is likely due to metabolic adaptations following significant weight loss, compounded by the use of bisoprolol , a beta blocker known to slow metabolism. Despite adherence to dietary and exercise regimens, she is experiencing weight plateau, which is frustrating. - Switch bisoprolol  to a weight-neutral beta blocker such as nebivolol (Bystolic) or carvedilol. - Implement intermittent fasting with an 8-hour eating window, aiming for 1000 calories per day, 75 grams of protein, and 25 grams of fiber. - Encourage continuation of exercise regimen. - Reassure Janice Hutchinson about non-scale victories and encourage focus on overall health benefits. - Spoke with Dr. Lance after the visit and she will be working with patient to switch beta-blocker  Hypertension, essential We reviewed goals of care.  Blood pressure goal less than 130/80.Janice Hutchinson has essential hypertension, managed with losartan, bisoprolol , and amlodipine . Her blood pressure today is slightly above goal and she reports it is well-controlled at home. - Continue losartan, bisoprolol , and amlodipine  - Work  with primary care team to change to a weight neutral beta-blocker like Bystolic or carvedilol if beta-blockade is still needed. Decreased GFR Patient is had fluctuations in her GFR.  With some low 60s high 50s. Chronic kidney disease is likely related to long-standing hypertension. Current medications, including losartan, may affect GFR but are necessary for blood pressure control.  - Ensure adequate hydration and avoid nephrotoxic medications such as NSAIDs. - Monitor renal function and adjust medications as necessary. -May benefit from GLP-1 treatment for nephro prophylaxis. History of stroke with residual effects - 10/2023, problems with speech,  recovered left sided weakness Persistent speech disturbance and tongue paresthesia following a TIA. Symptoms have not improved since stopping semaglutide . Recent dull headaches noted, but blood pressure is well-controlled. - Follow up with neurosurgeon as scheduled.         Objective   Physical Exam:  Blood pressure 134/76, pulse (!) 57, temperature 98 F (36.7 C), height 5' 6 (1.676 m), weight 197 lb (89.4 kg), SpO2 96%. Body mass index is 31.8 kg/m.  General: She is overweight, cooperative, alert, well developed, and in no acute distress. PSYCH: Has normal mood, affect and thought process.   HEENT: EOMI, sclerae are anicteric. Lungs: Normal breathing effort, no conversational dyspnea. Extremities: No edema.  Neurologic: No gross sensory or motor deficits. No tremors or fasciculations noted.    Diagnostic Data Reviewed:  BMET    Component Value Date/Time   NA 138 11/25/2023 0544   NA 140 02/03/2023 0000   K 3.8 11/25/2023 0544   CL 109 11/25/2023 0544   CO2 20 (L) 11/25/2023 0544  GLUCOSE 92 11/25/2023 0544   BUN 16 11/25/2023 0544   BUN 18 02/03/2023 0000   CREATININE 1.09 (H) 11/25/2023 0544   CALCIUM  9.3 11/25/2023 0544   GFRNONAA 54 (L) 11/25/2023 0544   GFRAA >60 03/17/2017 1048   Lab Results  Component Value Date    HGBA1C 4.6 (L) 11/24/2023   HGBA1C 4.7 (L) 05/29/2022   Lab Results  Component Value Date   INSULIN  15.0 12/01/2022   INSULIN  12.1 05/01/2022   Lab Results  Component Value Date   TSH 1.05 02/03/2023   CBC    Component Value Date/Time   WBC 4.0 11/25/2023 0544   RBC 5.49 (H) 11/25/2023 0544   HGB 14.3 11/25/2023 0544   HGB 15.3 05/29/2022 1600   HCT 43.8 11/25/2023 0544   HCT 47.4 (H) 05/29/2022 1600   PLT 214 11/25/2023 0544   MCV 79.8 (L) 11/25/2023 0544   MCV 80 05/29/2022 1600   MCH 26.0 11/25/2023 0544   MCHC 32.6 11/25/2023 0544   RDW 15.0 11/25/2023 0544   RDW 14.3 05/29/2022 1600   Iron Studies No results found for: IRON, TIBC, FERRITIN, IRONPCTSAT Lipid Panel     Component Value Date/Time   CHOL 176 11/24/2023 0619   CHOL 181 05/01/2022 1222   TRIG 71 11/24/2023 0619   HDL 64 11/24/2023 0619   HDL 52 05/01/2022 1222   CHOLHDL 2.8 11/24/2023 0619   VLDL 14 11/24/2023 0619   LDLCALC 98 11/24/2023 0619   LDLCALC 112 (H) 05/01/2022 1222   Hepatic Function Panel     Component Value Date/Time   PROT 6.7 11/25/2023 0544   PROT 7.4 05/29/2022 1600   ALBUMIN 3.3 (L) 11/25/2023 0544   ALBUMIN 4.2 05/29/2022 1600   AST 16 11/25/2023 0544   ALT 20 11/25/2023 0544   ALKPHOS 68 11/25/2023 0544   BILITOT 1.9 (H) 11/25/2023 0544   BILITOT 0.8 05/29/2022 1600   BILIDIR 0.2 11/24/2023 1230   IBILI 1.0 (H) 11/24/2023 1230      Component Value Date/Time   TSH 1.05 02/03/2023 0000   TSH 0.58 05/04/2014 1005   Nutritional Lab Results  Component Value Date   VD25OH 45.5 12/01/2022   VD25OH 14.1 (L) 05/29/2022   VD25OH 28.71 (L) 05/04/2014    Medications: Outpatient Encounter Medications as of 04/11/2024  Medication Sig   amLODipine  (NORVASC ) 5 MG tablet Take 5 mg by mouth in the morning and at bedtime.   Artificial Tear Solution (SOOTHE XP OP) Place 1-2 drops into both eyes 3 (three) times daily as needed (dry/irritated eyes.).   aspirin  EC 81  MG tablet Take 1 tablet (81 mg total) by mouth daily. Swallow whole.   atorvastatin  (LIPITOR) 40 MG tablet Take 1 tablet (40 mg total) by mouth daily.   bisoprolol  (ZEBETA ) 10 MG tablet Take by mouth in the morning.   losartan (COZAAR) 100 MG tablet Take 100 mg by mouth in the morning.   Facility-Administered Encounter Medications as of 04/11/2024  Medication   Spy Agent Landy / Firefly Optime     Follow-Up   No follow-ups on file.SABRA She was informed of the importance of frequent follow up visits to maximize her success with intensive lifestyle modifications for her multiple health conditions.  Attestation Statement   Reviewed by clinician on day of visit: allergies, medications, problem list, medical history, surgical history, family history, social history, and previous encounter notes.   I have spent 47 minutes in the care of the patient today including: 2  minutes before the visit reviewing and preparing the chart. 35 minutes face-to-face assessing and reviewing listed medical problems as outlined in obesity care plan, providing nutritional and behavioral counseling on topics outlined in the obesity care plan, counseling regarding anti-obesity medication as outlined in obesity care plan, care coordination with other providers : Dr. Jefferey over the phone, independently interpreting test results and goals of care, as described in assessment and plan, and reviewing and discussing biometric information and progress 10 minutes after the visit updating chart and documentation of encounter.    Lucas Parker, MD

## 2024-04-15 DIAGNOSIS — M5416 Radiculopathy, lumbar region: Secondary | ICD-10-CM | POA: Diagnosis not present

## 2024-05-03 DIAGNOSIS — M5416 Radiculopathy, lumbar region: Secondary | ICD-10-CM | POA: Diagnosis not present

## 2024-05-23 ENCOUNTER — Ambulatory Visit (INDEPENDENT_AMBULATORY_CARE_PROVIDER_SITE_OTHER): Admitting: Internal Medicine

## 2024-05-23 ENCOUNTER — Encounter (INDEPENDENT_AMBULATORY_CARE_PROVIDER_SITE_OTHER): Payer: Self-pay | Admitting: Internal Medicine

## 2024-05-23 VITALS — BP 135/74 | HR 63 | Temp 98.0°F | Ht 66.0 in | Wt 194.0 lb

## 2024-05-23 DIAGNOSIS — R948 Abnormal results of function studies of other organs and systems: Secondary | ICD-10-CM | POA: Diagnosis not present

## 2024-05-23 DIAGNOSIS — I6930 Unspecified sequelae of cerebral infarction: Secondary | ICD-10-CM

## 2024-05-23 DIAGNOSIS — R7303 Prediabetes: Secondary | ICD-10-CM | POA: Diagnosis not present

## 2024-05-23 DIAGNOSIS — I10 Essential (primary) hypertension: Secondary | ICD-10-CM

## 2024-05-23 DIAGNOSIS — E66811 Obesity, class 1: Secondary | ICD-10-CM

## 2024-05-23 DIAGNOSIS — Z6830 Body mass index (BMI) 30.0-30.9, adult: Secondary | ICD-10-CM

## 2024-05-23 MED ORDER — METFORMIN HCL 500 MG PO TABS: 500.0000 mg | ORAL_TABLET | Freq: Every day | ORAL | 0 refills | Status: DC

## 2024-05-23 NOTE — Assessment & Plan Note (Signed)
 Obesity with slow metabolic rate Obesity with a slow metabolic rate, contributing to gradual weight loss. She has lost three pounds since the last visit. The slow metabolic rate is likely influenced by bisoprolol , which may contribute to weight gain. She is hesitant to change medications due to past experiences with instability following medication changes. Discussed the potential benefits of switching to a weight-neutral beta blocker like Bystolic or carvedilol which does not affect metabolic rate. - Continue intermittent fasting from 7 PM to 11 AM. - Maintain high protein intake. - Continue regular exercise, including walking and gym workouts. - Discuss with primary care physician about switching to a weight-neutral beta blocker due to low metabolic rate - Consider retrying metformin with immediate release formulation at 250 mg once daily with breakfast.

## 2024-05-23 NOTE — Assessment & Plan Note (Signed)
 Her resting energy expenditure calculated by indirect calorimetry today was 994 in comparison to basal metabolic rate of 8490 her metabolic rate has gone down 33%.  This is likely due to adaptive thermogenesis from chronic calorie restriction and weight loss.  She is now losing weight using intermittent fasting and increasing volume of physical activity.

## 2024-05-23 NOTE — Progress Notes (Signed)
 Office: [phone]  /  Fax: [phone]  Weight Summary and Body Composition Analysis (BIA)  Vitals Temp: 98 F (36.7 C) BP: 135/74 Pulse Rate: 63 SpO2: 99 %   Anthropometric Measurements Height: 5' 6 (1.676 m) Weight: 194 lb (88 kg) BMI (Calculated): 31.33 Weight at Last Visit: 197lb Weight Lost Since Last Visit: 3lb Weight Gained Since Last Visit: 0lb Starting Weight: 215lb Total Weight Loss (lbs): 21 lb (9.526 kg) Peak Weight: 217lb   Body Composition  Body Fat %: 45.2 % Fat Mass (lbs): 88 lbs Muscle Mass (lbs): 101.2 lbs Total Body Water  (lbs): 38.1 lbs Visceral Fat Rating : 13    RMR: 994  Today's Visit #: 25  Starting Date: 05/11/22   Subjective   Chief Complaint: Obesity  Interval History Discussed the use of AI scribe software for clinical note transcription with the patient, who gave verbal consent to proceed.  History of Present Illness Janice Hutchinson is a 73 year old female who presents for medical weight management.  She has lost three pounds since her last visit, attributing this to portion control, intermittent fasting, and increased physical activity. She exercises three to four days a week for forty-five minutes, alternating between gym workouts and walking. Her physical activity has increased recently, aided by a back injection that alleviated her back pain.  She follows an intermittent fasting schedule from 7 PM to 11 AM, although occasionally eats after 8 PM due to being out. She takes her medications at 8 AM and starts eating at 11 AM, usually having breakfast or an early lunch, followed by a snack in the afternoon and dinner. She does not feel hungry until 11 AM.  She has a history of stroke and is currently on bisoprolol . She is concerned about changing medications due to past experiences of instability with medication changes, especially post-stroke.  She experiences persistent weakness on her left side and speech  difficulties, particularly in the afternoons when tired, which includes stuttering and slower processing. She does not feel back to her baseline post-stroke.  Her husband is currently stressed due to family health issues, including his brother being in hospice and awaiting a hematologist appointment for suspected CLL. She plans to retire at the end of December and is looking forward to focusing on herself.     Challenges affecting patient progress: orthopedic problems, medical conditions or chronic pain affecting mobility, medical comorbidities, slow metabolism for age, and menopause.    Pharmacotherapy for weight management: She is currently taking no anti-obesity medication and had been on zonisamide .  Treatment with Wegovy  for stroke was cost prohibitive.   Assessment and Plan   Treatment Plan For Obesity:  Recommended Dietary Goals  [name] is currently in the action stage of change. As such, her goal is to continue weight management plan. She has agreed to: incorporate 1-2 meal replacements a day for convenience  and continue current plan  Behavioral Health and Counseling  We discussed the following behavioral modification strategies today: continue to work on maintaining a reduced calorie state, getting the recommended amount of protein, incorporating whole foods, making healthy choices, staying well hydrated and practicing mindfulness when eating. and increase protein intake, fibrous foods (25 grams per day for women, 30 grams for men) and water  to improve satiety and decrease hunger signals. .  Additional education and resources provided today: None  Recommended Physical Activity Goals  [name] has been advised to work up to 150 minutes of moderate intensity aerobic activity a week and  strengthening exercises 2-3 times per week for cardiovascular health, weight loss maintenance and preservation of muscle mass.  She has agreed to :  Increase volume of physical activity to a goal of  240 minutes a week and Combine aerobic and strengthening exercises for efficiency and improved cardiometabolic health.  Medical Interventions and Pharmacotherapy  We discussed various medication options to help [name] with her weight loss efforts and we both agreed to : Start metformin IR 500 mg half a tablet daily.  She did not tolerate metformin XR in the past  Associated Conditions Impacted by Obesity Treatment  Assessment & Plan Essential hypertension Blood pressure at goal for age and risk category.  On amlodipine , bisoprolol  and losartan without adverse effects.   Discuss switching to a weight neutral beta-blocker with PCP due to her slow metabolic rate.  Abnormal metabolism Her resting energy expenditure calculated by indirect calorimetry today was 994 in comparison to basal metabolic rate of 8490 her metabolic rate has gone down 33%.  This is likely due to adaptive thermogenesis from chronic calorie restriction and weight loss.  She is now losing weight using intermittent fasting and increasing volume of physical activity. History of stroke with residual effects - 10/2023, problems with speech,  recovered left sided weakness  Pre-diabetes Improved.  She will be started on metformin immediate release 500 mg half tablet daily for weight management and diabetes prevention Class 1 obesity with serious comorbidity and body mass index (BMI) of 30.0 to 30.9 in adult, unspecified obesity type Obesity with slow metabolic rate Obesity with a slow metabolic rate, contributing to gradual weight loss. She has lost three pounds since the last visit. The slow metabolic rate is likely influenced by bisoprolol , which may contribute to weight gain. She is hesitant to change medications due to past experiences with instability following medication changes. Discussed the potential benefits of switching to a weight-neutral beta blocker like Bystolic or carvedilol which does not affect metabolic rate. -  Continue intermittent fasting from 7 PM to 11 AM. - Maintain high protein intake. - Continue regular exercise, including walking and gym workouts. - Discuss with primary care physician about switching to a weight-neutral beta blocker due to low metabolic rate - Consider retrying metformin with immediate release formulation at 250 mg once daily with breakfast.         Objective   Physical Exam:  Blood pressure 135/74, pulse 63, temperature 98 F (36.7 C), height 5' 6 (1.676 m), weight 194 lb (88 kg), SpO2 99%. Body mass index is 31.31 kg/m.  General: She is overweight, cooperative, alert, well developed, and in no acute distress. PSYCH: Has normal mood, affect and thought process.   HEENT: EOMI, sclerae are anicteric. Lungs: Normal breathing effort, no conversational dyspnea. Extremities: No edema.  Neurologic: No gross sensory or motor deficits. No tremors or fasciculations noted.    Diagnostic Data Reviewed:  BMET    Component Value Date/Time   NA 138 11/25/2023 0544   NA 140 02/03/2023 0000   K 3.8 11/25/2023 0544   CL 109 11/25/2023 0544   CO2 20 (L) 11/25/2023 0544   GLUCOSE 92 11/25/2023 0544   BUN 16 11/25/2023 0544   BUN 18 02/03/2023 0000   CREATININE 1.09 (H) 11/25/2023 0544   CALCIUM  9.3 11/25/2023 0544   GFRNONAA 54 (L) 11/25/2023 0544   GFRAA >60 03/17/2017 1048   Lab Results  Component Value Date   HGBA1C 4.6 (L) 11/24/2023   HGBA1C 4.7 (L) 05/29/2022   Lab  Results  Component Value Date   INSULIN  15.0 12/01/2022   INSULIN  12.1 05/01/2022   Lab Results  Component Value Date   TSH 1.05 02/03/2023   CBC    Component Value Date/Time   WBC 4.0 11/25/2023 0544   RBC 5.49 (H) 11/25/2023 0544   HGB 14.3 11/25/2023 0544   HGB 15.3 05/29/2022 1600   HCT 43.8 11/25/2023 0544   HCT 47.4 (H) 05/29/2022 1600   PLT 214 11/25/2023 0544   MCV 79.8 (L) 11/25/2023 0544   MCV 80 05/29/2022 1600   MCH 26.0 11/25/2023 0544   MCHC 32.6 11/25/2023 0544    RDW 15.0 11/25/2023 0544   RDW 14.3 05/29/2022 1600   Iron Studies No results found for: IRON, TIBC, FERRITIN, IRONPCTSAT Lipid Panel     Component Value Date/Time   CHOL 176 11/24/2023 0619   CHOL 181 05/01/2022 1222   TRIG 71 11/24/2023 0619   HDL 64 11/24/2023 0619   HDL 52 05/01/2022 1222   CHOLHDL 2.8 11/24/2023 0619   VLDL 14 11/24/2023 0619   LDLCALC 98 11/24/2023 0619   LDLCALC 112 (H) 05/01/2022 1222   Hepatic Function Panel     Component Value Date/Time   PROT 6.7 11/25/2023 0544   PROT 7.4 05/29/2022 1600   ALBUMIN 3.3 (L) 11/25/2023 0544   ALBUMIN 4.2 05/29/2022 1600   AST 16 11/25/2023 0544   ALT 20 11/25/2023 0544   ALKPHOS 68 11/25/2023 0544   BILITOT 1.9 (H) 11/25/2023 0544   BILITOT 0.8 05/29/2022 1600   BILIDIR 0.2 11/24/2023 1230   IBILI 1.0 (H) 11/24/2023 1230      Component Value Date/Time   TSH 1.05 02/03/2023 0000   TSH 0.58 05/04/2014 1005   Nutritional Lab Results  Component Value Date   VD25OH 45.5 12/01/2022   VD25OH 14.1 (L) 05/29/2022   VD25OH 28.71 (L) 05/04/2014    Medications: Outpatient Encounter Medications as of 05/23/2024  Medication Sig   amLODipine  (NORVASC ) 5 MG tablet Take 5 mg by mouth in the morning and at bedtime.   Artificial Tear Solution (SOOTHE XP OP) Place 1-2 drops into both eyes 3 (three) times daily as needed (dry/irritated eyes.).   aspirin  EC 81 MG tablet Take 1 tablet (81 mg total) by mouth daily. Swallow whole.   atorvastatin  (LIPITOR) 40 MG tablet Take 1 tablet (40 mg total) by mouth daily.   bisoprolol  (ZEBETA ) 10 MG tablet Take by mouth in the morning.   losartan (COZAAR) 100 MG tablet Take 100 mg by mouth in the morning.   metFORMIN (GLUCOPHAGE) 500 MG tablet Take 1 tablet (500 mg total) by mouth daily with breakfast.   Facility-Administered Encounter Medications as of 05/23/2024  Medication   Spy Agent Green / Firefly Optime     Follow-Up   Return in about 6 weeks (around 07/04/2024) for  For Weight Mangement with Dr. Francyne.SABRA She was informed of the importance of frequent follow up visits to maximize her success with intensive lifestyle modifications for her multiple health conditions.  Attestation Statement   Reviewed by clinician on day of visit: allergies, medications, problem list, medical history, surgical history, family history, social history, and previous encounter notes.     Lucas Francyne, MD

## 2024-05-23 NOTE — Assessment & Plan Note (Signed)
 Improved.  She will be started on metformin immediate release 500 mg half tablet daily for weight management and diabetes prevention

## 2024-05-23 NOTE — Assessment & Plan Note (Signed)
 Blood pressure at goal for age and risk category.  On amlodipine , bisoprolol  and losartan without adverse effects.   Discuss switching to a weight neutral beta-blocker with PCP due to her slow metabolic rate.

## 2024-06-14 ENCOUNTER — Other Ambulatory Visit (INDEPENDENT_AMBULATORY_CARE_PROVIDER_SITE_OTHER): Payer: Self-pay | Admitting: Internal Medicine

## 2024-06-14 DIAGNOSIS — R7303 Prediabetes: Secondary | ICD-10-CM

## 2024-06-20 ENCOUNTER — Other Ambulatory Visit (INDEPENDENT_AMBULATORY_CARE_PROVIDER_SITE_OTHER): Payer: Self-pay | Admitting: Internal Medicine

## 2024-06-20 DIAGNOSIS — R7303 Prediabetes: Secondary | ICD-10-CM

## 2024-07-05 ENCOUNTER — Encounter (INDEPENDENT_AMBULATORY_CARE_PROVIDER_SITE_OTHER): Payer: Self-pay | Admitting: Internal Medicine

## 2024-07-05 ENCOUNTER — Ambulatory Visit (INDEPENDENT_AMBULATORY_CARE_PROVIDER_SITE_OTHER): Admitting: Internal Medicine

## 2024-07-05 VITALS — BP 154/82 | HR 56 | Temp 98.1°F | Ht 66.0 in | Wt 193.0 lb

## 2024-07-05 DIAGNOSIS — Z6830 Body mass index (BMI) 30.0-30.9, adult: Secondary | ICD-10-CM

## 2024-07-05 DIAGNOSIS — R7303 Prediabetes: Secondary | ICD-10-CM

## 2024-07-05 DIAGNOSIS — R948 Abnormal results of function studies of other organs and systems: Secondary | ICD-10-CM

## 2024-07-05 DIAGNOSIS — K760 Fatty (change of) liver, not elsewhere classified: Secondary | ICD-10-CM

## 2024-07-05 NOTE — Assessment & Plan Note (Signed)
 Her resting energy expenditure calculated by indirect calorimetry today was 994 in comparison to basal metabolic rate of 8490 her metabolic rate has gone down 33%.  This is likely due to adaptive thermogenesis from chronic calorie restriction and weight loss.  She is now losing weight using intermittent fasting and is working on increasing volume of physical activity

## 2024-07-05 NOTE — Progress Notes (Signed)
 Office: [phone]  /  Fax: [phone]  Weight Summary and Body Composition Analysis (BIA)  Vitals Temp: 98.1 F (36.7 C) BP: (!) 154/82 Pulse Rate: (!) 56 SpO2: 99 %   Anthropometric Measurements Height: 5' 6 (1.676 m) Weight: 193 lb (87.5 kg) BMI (Calculated): 31.17 Weight at Last Visit: 194lb Weight Lost Since Last Visit: 1 lb Weight Gained Since Last Visit: 0 lb Starting Weight: 215 lb Total Weight Loss (lbs): 22 lb (9.979 kg) Peak Weight: 217 lb   Body Composition  Body Fat %: 45.1 % Fat Mass (lbs): 87.4 lbs Muscle Mass (lbs): 100.8 lbs Total Body Water  (lbs): 71.8 lbs Visceral Fat Rating : 13    RMR: 994  Today's Visit #: 26  Starting Date: 05/11/22   Subjective   Chief Complaint: Obesity  Interval History Discussed the use of AI scribe software for clinical note transcription with the patient, who gave verbal consent to proceed.  History of Present Illness Janice Hutchinson is a 73 year old female who presents for medical weight management.  Since last office visit she has lost 1 pound.  She has been practicing intermittent fasting, targeting about 1000 calories per day, and has lost one pound since her last visit. She typically consumes two meals a day, consisting of a green salad with rotisserie chicken and a sweet potato with salad, focusing on protein intake. She has not been exercising due to back issues, which she attributes partly to stress. A previous shot for her back pain has worn off.  She has a history of a stroke and experiences occasional speech difficulties, such as word-finding issues and stuttering, particularly when tired. She avoids overstimulation and excessive fatigue.  She is currently on metformin , taking half a tablet, and reports no side effects. She practices intermittent fasting by not eating after 7 PM and before 11 AM.  Her husband was diagnosed with chronic lymphocytic leukemia (CLL) about three weeks ago, and she  is supporting him emotionally. He has experienced a 15-pound weight loss, and there is concern about whether this is due to his condition or psychological stress.  She reports a history of elevated bilirubin levels, which have been monitored and sometimes return to normal. She had her gallbladder removed last year. A recent liver scan showed fatty liver, but her liver enzymes were normal.  She is recently retired and is considering finding part-time work to stay active. 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     Challenges affecting patient progress: slow metabolism for age, menopause, having difficulties with GLP-1 or AOM coverage, and inadequate response to nutritional and behavioral strategies.    Pharmacotherapy for weight management: She is currently taking Metformin  (off label use for weight management and / or insulin  resistance and / or diabetes prevention) with adequate clinical response  and without side effects..   Assessment and Plan   Treatment Plan For Obesity:  Recommended Dietary Goals  Janice Hutchinson is currently in the action stage of change. As such, her goal is to continue weight management plan. She has agreed to: continue current plan  Behavioral Health and Counseling  We discussed the following behavioral modification strategies today: continue to work on maintaining a reduced calorie state, getting the recommended amount of protein, incorporating whole foods, making healthy choices, staying well hydrated and practicing mindfulness when eating. and increase protein intake, fibrous foods (25 grams per day for women, 30 grams for men) and water  to improve satiety and decrease  hunger signals. .  Additional education and resources provided today: Handout on traveling and holiday eating strategies  Recommended Physical Activity Goals  Janice Hutchinson has been advised to work up to 150 minutes of moderate intensity aerobic activity a week and strengthening  exercises 2-3 times per week for cardiovascular health, weight loss maintenance and preservation of muscle mass.  She has agreed to :  Think about enjoyable ways to increase daily physical activity and overcoming barriers to exercise and Increase physical activity in their day and reduce sedentary time (increase NEAT).  Medical Interventions and Pharmacotherapy  We discussed various medication options to help Janice Hutchinson with her weight loss efforts and we both agreed to : Was educated on GLP-1 receptor agonists, their role in managing obesity-related conditions and commonly associated side effects.   Associated Conditions Impacted by Obesity Treatment  Assessment & Plan Class 1 obesity with serious comorbidity and body mass index (BMI) of 30.0 to 30.9 in adult, unspecified obesity type Weight: decrease of 24 lb (11.1%) over 2 years, 2 months  Start: 04/17/2022 217 lb (98.4 kg)  End: 07/05/2024 193 lb (87.5 kg)  Management is ongoing with intermittent fasting and a target of 1000 calories per day. She has lost one pound since the last visit. Physical activity is limited due to back issues, but she plans to resume gym activities. Metformin  is being used at half the dose, with no side effects reported. Discussion on the potential use of Wegovy  for weight management and its new indication for fatty liver disease. Pricing of weight management medications has decreased, and insurance coverage may improve. - Increase metformin  to full tablet. - Resume gym activities with focus on upper body exercises. - Consider aqua exercises if feasible. - Monitor weight and dietary intake. - Will discuss potential use of Wegovy  if insurance coverage is favorable. Pre-diabetes Improved.  She is now on metformin  immediate release 500 mg half tablet daily for weight management and diabetes prevention.  Continue medication Abnormal metabolism Her resting energy expenditure calculated by indirect calorimetry today was 994  in comparison to basal metabolic rate of 8490 her metabolic rate has gone down 33%.  This is likely due to adaptive thermogenesis from chronic calorie restriction and weight loss.  She is now losing weight using intermittent fasting and is working on increasing volume of physical activity Metabolic dysfunction-associated steatotic liver disease (MASLD) Recent liver ultrasound showed fatty liver. Bilirubin levels were elevated, but liver enzymes were normal, indicating no liver damage. Wegovy  has a new indication for fatty liver disease, which may be covered by insurance. - Will obtain ultrasound report for review. - Will discuss potential use of Wegovy  for fatty liver disease if insurance coverage is favorable.          Objective   Physical Exam:  Blood pressure (!) 154/82, pulse (!) 56, temperature 98.1 F (36.7 C), height 5' 6 (1.676 m), weight 193 lb (87.5 kg), SpO2 99%. Body mass index is 31.15 kg/m.  General: She is overweight, cooperative, alert, well developed, and in no acute distress. PSYCH: Has normal mood, affect and thought process.   HEENT: EOMI, sclerae are anicteric. Lungs: Normal breathing effort, no conversational dyspnea. Extremities: No edema.  Neurologic: No gross sensory or motor deficits. No tremors or fasciculations noted.    Diagnostic Data Reviewed:  BMET    Component Value Date/Time   NA 138 11/25/2023 0544   NA 140 02/03/2023 0000   K 3.8 11/25/2023 0544   CL 109 11/25/2023 0544  CO2 20 (L) 11/25/2023 0544   GLUCOSE 92 11/25/2023 0544   BUN 16 11/25/2023 0544   BUN 18 02/03/2023 0000   CREATININE 1.09 (H) 11/25/2023 0544   CALCIUM  9.3 11/25/2023 0544   GFRNONAA 54 (L) 11/25/2023 0544   GFRAA >60 03/17/2017 1048   Lab Results  Component Value Date   HGBA1C 4.6 (L) 11/24/2023   HGBA1C 4.7 (L) 05/29/2022   Lab Results  Component Value Date   INSULIN  15.0 12/01/2022   INSULIN  12.1 05/01/2022   Lab Results  Component Value Date   TSH  1.05 02/03/2023   CBC    Component Value Date/Time   WBC 4.0 11/25/2023 0544   RBC 5.49 (H) 11/25/2023 0544   HGB 14.3 11/25/2023 0544   HGB 15.3 05/29/2022 1600   HCT 43.8 11/25/2023 0544   HCT 47.4 (H) 05/29/2022 1600   PLT 214 11/25/2023 0544   MCV 79.8 (L) 11/25/2023 0544   MCV 80 05/29/2022 1600   MCH 26.0 11/25/2023 0544   MCHC 32.6 11/25/2023 0544   RDW 15.0 11/25/2023 0544   RDW 14.3 05/29/2022 1600   Iron Studies No results found for: IRON, TIBC, FERRITIN, IRONPCTSAT Lipid Panel     Component Value Date/Time   CHOL 176 11/24/2023 0619   CHOL 181 05/01/2022 1222   TRIG 71 11/24/2023 0619   HDL 64 11/24/2023 0619   HDL 52 05/01/2022 1222   CHOLHDL 2.8 11/24/2023 0619   VLDL 14 11/24/2023 0619   LDLCALC 98 11/24/2023 0619   LDLCALC 112 (H) 05/01/2022 1222   Hepatic Function Panel     Component Value Date/Time   PROT 6.7 11/25/2023 0544   PROT 7.4 05/29/2022 1600   ALBUMIN 3.3 (L) 11/25/2023 0544   ALBUMIN 4.2 05/29/2022 1600   AST 16 11/25/2023 0544   ALT 20 11/25/2023 0544   ALKPHOS 68 11/25/2023 0544   BILITOT 1.9 (H) 11/25/2023 0544   BILITOT 0.8 05/29/2022 1600   BILIDIR 0.2 11/24/2023 1230   IBILI 1.0 (H) 11/24/2023 1230      Component Value Date/Time   TSH 1.05 02/03/2023 0000   TSH 0.58 05/04/2014 1005   Nutritional Lab Results  Component Value Date   VD25OH 45.5 12/01/2022   VD25OH 14.1 (L) 05/29/2022   VD25OH 28.71 (L) 05/04/2014    Medications: Outpatient Encounter Medications as of 07/05/2024  Medication Sig   amLODipine  (NORVASC ) 5 MG tablet Take 5 mg by mouth in the morning and at bedtime.   Artificial Tear Solution (SOOTHE XP OP) Place 1-2 drops into both eyes 3 (three) times daily as needed (dry/irritated eyes.).   aspirin  EC 81 MG tablet Take 1 tablet (81 mg total) by mouth daily. Swallow whole.   atorvastatin  (LIPITOR) 40 MG tablet Take 1 tablet (40 mg total) by mouth daily.   bisoprolol  (ZEBETA ) 10 MG tablet Take by  mouth in the morning.   losartan (COZAAR) 100 MG tablet Take 100 mg by mouth in the morning.   metFORMIN  (GLUCOPHAGE ) 500 MG tablet TAKE 1 TABLET BY MOUTH EVERY DAY WITH BREAKFAST   Facility-Administered Encounter Medications as of 07/05/2024  Medication   Spy Agent Green / Firefly Optime     Follow-Up   Return in about 6 weeks (around 08/16/2024) for For Weight Mangement with Dr. [email or web address] She was informed of the importance of frequent follow up visits to maximize her success with intensive lifestyle modifications for her multiple health conditions.  Attestation Statement   Reviewed by clinician on day of  visit: allergies, medications, problem list, medical history, surgical history, family history, social history, and previous encounter notes.     Lucas Parker, MD

## 2024-07-05 NOTE — Assessment & Plan Note (Signed)
 Improved.  She is now on metformin  immediate release 500 mg half tablet daily for weight management and diabetes prevention.  Continue medication

## 2024-07-05 NOTE — Assessment & Plan Note (Signed)
 Weight: decrease of 24 lb (11.1%) over 2 years, 2 months  Start: 04/17/2022 217 lb (98.4 kg)  End: 07/05/2024 193 lb (87.5 kg)  Management is ongoing with intermittent fasting and a target of 1000 calories per day. She has lost one pound since the last visit. Physical activity is limited due to back issues, but she plans to resume gym activities. Metformin  is being used at half the dose, with no side effects reported. Discussion on the potential use of Wegovy  for weight management and its new indication for fatty liver disease. Pricing of weight management medications has decreased, and insurance coverage may improve. - Increase metformin  to full tablet. - Resume gym activities with focus on upper body exercises. - Consider aqua exercises if feasible. - Monitor weight and dietary intake. - Will discuss potential use of Wegovy  if insurance coverage is favorable.

## 2024-08-01 ENCOUNTER — Encounter (INDEPENDENT_AMBULATORY_CARE_PROVIDER_SITE_OTHER): Payer: Self-pay | Admitting: Internal Medicine

## 2024-08-16 ENCOUNTER — Encounter (INDEPENDENT_AMBULATORY_CARE_PROVIDER_SITE_OTHER): Payer: Self-pay | Admitting: Internal Medicine

## 2024-08-16 ENCOUNTER — Ambulatory Visit (INDEPENDENT_AMBULATORY_CARE_PROVIDER_SITE_OTHER): Admitting: Internal Medicine

## 2024-08-16 VITALS — BP 142/73 | HR 59 | Ht 66.0 in | Wt 192.0 lb

## 2024-08-16 DIAGNOSIS — R7303 Prediabetes: Secondary | ICD-10-CM

## 2024-08-16 DIAGNOSIS — I6930 Unspecified sequelae of cerebral infarction: Secondary | ICD-10-CM

## 2024-08-16 DIAGNOSIS — I10 Essential (primary) hypertension: Secondary | ICD-10-CM | POA: Diagnosis not present

## 2024-08-16 DIAGNOSIS — Z6830 Body mass index (BMI) 30.0-30.9, adult: Secondary | ICD-10-CM | POA: Diagnosis not present

## 2024-08-16 DIAGNOSIS — E66811 Obesity, class 1: Secondary | ICD-10-CM | POA: Diagnosis not present

## 2024-08-16 DIAGNOSIS — K760 Fatty (change of) liver, not elsewhere classified: Secondary | ICD-10-CM

## 2024-08-16 MED ORDER — WEGOVY 0.25 MG/0.5ML ~~LOC~~ SOAJ: 0.2500 mg | SUBCUTANEOUS | 0 refills | Status: AC

## 2024-08-16 NOTE — Assessment & Plan Note (Signed)
 Janice Hutchinson is experiencing residual speech issues, suggesting a stroke rather than a TIA. She reports memory issues, vision changes, and occasional headaches. She had left-sided weakness, which has resolved, but continues to have word-finding difficulties.  - Continue current medications: losartan, bisoprolol , amlodipine , baby aspirin , atorvastatin  - Monitor blood pressure at home for goal of less than 130/80.  Her blood pressure still above target.  I feel that treatment with semaglutide  may help lower blood pressure as well as reduce cardiovascular risk.

## 2024-08-16 NOTE — Assessment & Plan Note (Signed)
" °  Weight: decrease of 25 lb (11.5%) over 2 years, 4 months  Start: 04/17/2022 217 lb (98.4 kg)  End: 08/16/2024 192 lb (87.1 kg)  Class 1 obesity with serious comorbidity, including history of stroke, metabolic dysfunction-associated steatotic liver disease, pre-diabetes, and essential hypertension. Current weight management includes a 1000 calorie nutrition target and intermittent fasting with good adherence. Exercise is limited due to back problems. Considering GLP-1 agonist therapy (Wegovy ) for weight loss and comorbidity management. Discussed potential benefits of Wegovy , including weight loss, blood pressure reduction, and nephroprotection. Discussed cost considerations and insurance coverage potential. Emphasized long-term use for sustained benefits. Discussed potential to reduce blood pressure medications as weight decreases. Considered Wegovy  injection over pill form due to convenience and supply issues. - Submitted prescription for Wegovy  to pharmacy. - Await insurance response regarding coverage. - Continue current nutrition and intermittent fasting regimen. - Consider meal replacements to aid in calorie reduction. "

## 2024-08-16 NOTE — Assessment & Plan Note (Signed)
 Essential hypertension, potentially influenced by age-related arterial stiffness. Considering GLP-1 agonist therapy (Wegovy ) for blood pressure management. Discussed potential benefits of Wegovy  in lowering blood pressure and reducing the need for antihypertensive medications. - Submitted prescription for Wegovy  to pharmacy. - Await insurance response regarding coverage. - Monitor blood pressure and adjust antihypertensive medications as needed.

## 2024-08-16 NOTE — Assessment & Plan Note (Signed)
 Improved.  She is now on metformin  immediate release 500 mg half tablet daily for weight management and diabetes prevention but is experiencing side effects.  Patient will start taking half a tablet.  She would also benefit from treatment with GLP-1 for diabetes prevention

## 2024-08-16 NOTE — Progress Notes (Signed)
 "  Lucas Parker, MD ABIM, ABOM  [address] [place], [place], [place] [address] Office: [phone]  /  Fax: [phone]    Weight Summary and Body Composition Analysis (BIA)  Vitals BP: (!) 142/73 Pulse Rate: (!) 59 SpO2: 98 %   Anthropometric Measurements Height: 5' 6 (1.676 m) Weight: 192 lb (87.1 kg) BMI (Calculated): 31 Weight at Last Visit: 193 lb Weight Lost Since Last Visit: 1 lb Weight Gained Since Last Visit: 0 lb Starting Weight: 215 lb Total Weight Loss (lbs): 23 lb (10.4 kg) Peak Weight: 217 lb   Body Composition  Body Fat %: 44.7 % Fat Mass (lbs): 86 lbs Muscle Mass (lbs): 100.8 lbs Total Body Water  (lbs): 71.6 lbs Visceral Fat Rating : 13    RMR: [id number]  Today's Visit #: 27  Starting Date: 05/11/22   Subjective   Chief Complaint: Obesity  Interval History  Discussed the use of AI scribe software for clinical note transcription with the patient, who gave verbal consent to proceed.   History of Present Illness Janice Hutchinson is a 74 year old female who presents for medical weight management follow-up.She has a history of hypertension, chronic kidney disease stage IIIa, prediabetes and history of stroke with residual effects.  She is adhering to a 1000-calorie nutrition target and incorporating intermittent fasting. Her physical activity is limited to exercising once a week for 20 minutes due to back problems. She has lost one pound since her last office visit but overall she has lost 25 pounds which represents 11.5%  She experiences numbness in her legs and significant knee pain, which impacts her ability to exercise. She plans to see Dr. Debby for her back issues.  She has a history of a transient ischemic attack (TIA) and has been told she has mild fat accumulation in the liver based on a previous ultrasound. She is managing her weight and health conditions with a focus on diet and exercise.  She experiences severe diarrhea while on  metformin , which has been distressing and limits her ability to go out.  Her social history includes being recently retired and taking care of her two-year-old great-grandson, which she describes as 'like I'm working all day.'     Nutritional:  Intake consistent with prescribed nutritional plan  Orexigenic control  [] Denies [x] Reports problems with appetite and hunger signals.  [x] Denies [] Reports problems with satiety and satiation.  [x] Denies [] Reports problems with eating patterns and portion control.  [x] Denies [] Reports Improved abnormal cravings   Challenges affecting patient progress: medical comorbidities, having difficulties with GLP-1 or AOM coverage, and inadequate response to nutritional and behavioral strategies   Pharmacotherapy for weight management: She is currently taking Metformin  (off label use for weight management and / or insulin  resistance and / or diabetes prevention) with adequate clinical response  and experiencing the following side effects: Diarrhea when taking the full tablet.   Assessment and Plan   Treatment Plan For Obesity:  Recommended Dietary Goals  Janice Hutchinson is currently in the action stage of change. As such, her goal is to continue weight management plan. She has agreed to: follow a balanced (30%/40%/30%), whole foods-based, reduced-calorie meal plan (RCNP) targeting 1000 kcal per day, incorporate prepackaged healthy meals for convenience, and incorporate 1-2 meal replacements a day for convenience   Behavioral Health and Counseling  We discussed the following behavioral modification strategies today: work on tracking and journaling calories using tracking application, continue to work on maintaining a reduced calorie state, getting the recommended amount of  protein, incorporating whole foods, making healthy choices, staying well hydrated and practicing mindfulness when eating., and increase protein intake, fibrous foods (25 grams per day for women,  30 grams for men) and water  to improve satiety and decrease hunger signals. .  Additional education and resources provided today: None  Recommended Physical Activity Goals  Janice Hutchinson has been advised to work up to 240 minutes of combined endurance and strengthening exercises per week for cardiovascular health, weight loss maintenance and preservation of muscle mass.  She has agreed to :  Think about enjoyable ways to increase daily physical activity and overcoming barriers to exercise, Increase physical activity in their day and reduce sedentary time (increase NEAT)., and Increase and monitor steps for a goal of 10,000 per day  Medical Interventions and Pharmacotherapy  We have reviewed current or available treatment options to assist her with their weight loss efforts and we agreed to : We discussed various medication options to help Janice Hutchinson with her weight loss efforts and we both agreed to starting anti-obesity pharmacotherapy with an GLP-1 agonist. In addition to a prescribed reduced calorie nutrition plan (RCNP), behavioral strategies and physical activity, Janice Hutchinson would benefit from pharmacotherapy to assist with abnormal hunger signals, impaired satiety and cravings. This will reduce obesity-related health risks by inducing weight loss, and help reduce food consumption and adherence to Janice Hutchinson). It may also improve QOL by improving self-confidence and reduce the setbacks associated with metabolic adaptations.  she also has high risk comorbidities associated with her obesity including: Hypertension, MASLD, Prediabetes, Kidney disease, and Other: History of stroke last year. All of these conditions increase her risk for future complications and are either improved or potentially reverse through sustained weight loss.  Patient has already lost 25 pounds or 11.5% via lifestyle changes. She is having side effects to metformin  Has contraindications to sympathomimetics and antiepileptics  GLP-1 receptor  agonists have been shown in robust clinical trials to: Enhance satiety and delay gastric emptying, resulting in reduced caloric intake Improve insulin  sensitivity and glycemic control Promote clinically meaningful weight loss (>=5-22%) Reduce cardiovascular risk markers, including blood pressure, lipids, and inflammation Improved sleep apnea and associated complications by helping patient potentially lose more than 15% of total body weight.  This is usually what is required to reduce AHI.  Given Janice Hutchinson's clinical profile--GLP-1 therapy is both indicated and expected to provide multifactorial benefit. The medication's mechanism aligns precisely with the patient's needs and addresses the physiologic underpinnings of weight gain.   After a detailed discussion covering treatment rationale, mechanism of action, common side effects, expected outcomes, risks, and long-term use considerations, shared decision-making was used to initiate Janice Hutchinson  0.25 mg once a week. The importance of ongoing lifestyle management and long-term adherence was emphasized, as was the potential for weight regain following discontinuation.  The delivery device was demonstrated, and using the teach-back method, Janice Hutchinson successfully demonstrated proper injection technique. Ongoing monitoring and follow-up are planned to assess tolerability, clinical response, and reinforce behavioral strategies.   Associated Conditions Impacted by Medically Supervised Weight Management Plan  Assessment & Plan History of stroke with residual effects Janice Hutchinson is experiencing residual speech issues, suggesting a stroke rather than a TIA. She reports memory issues, vision changes, and occasional headaches. She had left-sided weakness, which has resolved, but continues to have word-finding difficulties.  - Continue current medications: losartan, bisoprolol , amlodipine , baby aspirin , atorvastatin  - Monitor blood pressure at home for goal of less than 130/80.   Her blood pressure still above target.  I feel that  treatment with semaglutide  may help lower blood pressure as well as reduce cardiovascular risk. Metabolic dysfunction-associated steatotic liver disease (MASLD) She was found to have mild hepatic steatosis on ultrasound from last year and mild elevation in ALT.  Liver synthetic function is normal.  She does not drink alcohol and is not steatotic medications.  She continues to maintain a low-carb calorie reduced balanced diet.  Patient would benefit from treatment with semaglutide  to lower the risk of progression. Pre-diabetes Improved.  She is now on metformin  immediate release 500 mg half tablet daily for weight management and diabetes prevention but is experiencing side effects.  Patient will start taking half a tablet.  She would also benefit from treatment with GLP-1 for diabetes prevention Class 1 obesity with serious comorbidity and body mass index (BMI) of 30.0 to 30.9 in adult, unspecified obesity type  Weight: decrease of 25 lb (11.5%) over 2 years, 4 months  Start: 04/17/2022 217 lb (98.4 kg)  End: 08/16/2024 192 lb (87.1 kg)  Class 1 obesity with serious comorbidity, including history of stroke, metabolic dysfunction-associated steatotic liver disease, pre-diabetes, and essential hypertension. Current weight management includes a 1000 calorie nutrition target and intermittent fasting with good adherence. Exercise is limited due to back problems. Considering GLP-1 agonist therapy (Janice Hutchinson ) for weight loss and comorbidity management. Discussed potential benefits of Janice Hutchinson , including weight loss, blood pressure reduction, and nephroprotection. Discussed cost considerations and insurance coverage potential. Emphasized long-term use for sustained benefits. Discussed potential to reduce blood pressure medications as weight decreases. Considered Janice Hutchinson  injection over pill form due to convenience and supply issues. - Submitted prescription for Janice Hutchinson  to  pharmacy. - Await insurance response regarding coverage. - Continue current nutrition and intermittent fasting regimen. - Consider meal replacements to aid in calorie reduction. Essential hypertension Essential hypertension, potentially influenced by age-related arterial stiffness. Considering GLP-1 agonist therapy (Janice Hutchinson ) for blood pressure management. Discussed potential benefits of Janice Hutchinson  in lowering blood pressure and reducing the need for antihypertensive medications. - Submitted prescription for Janice Hutchinson  to pharmacy. - Await insurance response regarding coverage. - Monitor blood pressure and adjust antihypertensive medications as needed.     Anti-obesity pharmacotherapy reviewed in detail including mechanism of action, contraindications, potential adverse effects, and need for ongoing monitoring. Patient counseled on symptoms and conditions warranting discontinuation and follow-up. Reviewed relevant laboratory data and prior clinical records to assess metabolic risk and treatment response, including weight trends, biometric measurements and cardiometabolic markers.  Obesity-related comorbidities place patient at elevated cardiovascular and metabolic risk, necessitating close monitoring and longitudinal management.  Shared decision-making performed regarding continued pharmacotherapy versus alternative treatment options, including risks of untreated obesity and benefits of sustained weight loss.      Objective   Physical Exam:  Blood pressure (!) 142/73, pulse (!) 59, height 5' 6 (1.676 m), weight 192 lb (87.1 kg), SpO2 98%. Body mass index is 30.99 kg/m.  General: She is overweight, cooperative, alert, well developed, and in no acute distress. PSYCH: Has normal mood, affect and thought process.   HEENT: EOMI, sclerae are anicteric. Lungs: Normal breathing effort, no conversational dyspnea. Extremities: No edema.  Neurologic: No gross sensory or motor deficits. No tremors or  fasciculations noted.    Diagnostic Data Reviewed:  BMET    Component Value Date/Time   NA 138 11/25/2023 0544   NA 140 02/03/2023 0000   K 3.8 11/25/2023 0544   CL 109 11/25/2023 0544   CO2 20 (L) 11/25/2023 0544   GLUCOSE 92 11/25/2023 0544   BUN 16  11/25/2023 0544   BUN 18 02/03/2023 0000   CREATININE 1.09 (H) 11/25/2023 0544   CALCIUM  9.3 11/25/2023 0544   GFRNONAA 54 (L) 11/25/2023 0544   GFRAA >60 03/17/2017 1048   Lab Results  Component Value Date   HGBA1C 4.6 (L) 11/24/2023   HGBA1C 4.7 (L) 05/29/2022   Lab Results  Component Value Date   INSULIN  15.0 12/01/2022   INSULIN  12.1 05/01/2022   Lab Results  Component Value Date   TSH 1.05 02/03/2023   CBC    Component Value Date/Time   WBC 4.0 11/25/2023 0544   RBC 5.49 (H) 11/25/2023 0544   HGB 14.3 11/25/2023 0544   HGB 15.3 05/29/2022 1600   HCT 43.8 11/25/2023 0544   HCT 47.4 (H) 05/29/2022 1600   PLT 214 11/25/2023 0544   MCV 79.8 (L) 11/25/2023 0544   MCV 80 05/29/2022 1600   MCH 26.0 11/25/2023 0544   MCHC 32.6 11/25/2023 0544   RDW 15.0 11/25/2023 0544   RDW 14.3 05/29/2022 1600   Iron Studies No results found for: IRON, TIBC, FERRITIN, IRONPCTSAT Lipid Panel     Component Value Date/Time   CHOL 176 11/24/2023 0619   CHOL 181 05/01/2022 1222   TRIG 71 11/24/2023 0619   HDL 64 11/24/2023 0619   HDL 52 05/01/2022 1222   CHOLHDL 2.8 11/24/2023 0619   VLDL 14 11/24/2023 0619   LDLCALC 98 11/24/2023 0619   LDLCALC 112 (H) 05/01/2022 1222   Hepatic Function Panel     Component Value Date/Time   PROT 6.7 11/25/2023 0544   PROT 7.4 05/29/2022 1600   ALBUMIN 3.3 (L) 11/25/2023 0544   ALBUMIN 4.2 05/29/2022 1600   AST 16 11/25/2023 0544   ALT 20 11/25/2023 0544   ALKPHOS 68 11/25/2023 0544   BILITOT 1.9 (H) 11/25/2023 0544   BILITOT 0.8 05/29/2022 1600   BILIDIR 0.2 11/24/2023 1230   IBILI 1.0 (H) 11/24/2023 1230      Component Value Date/Time   TSH 1.05 02/03/2023 0000    TSH 0.58 05/04/2014 1005   Nutritional Lab Results  Component Value Date   VD25OH 45.5 12/01/2022   VD25OH 14.1 (L) 05/29/2022   VD25OH 28.71 (L) 05/04/2014    Medications: Outpatient Encounter Medications as of 08/16/2024  Medication Sig   amLODipine  (NORVASC ) 5 MG tablet Take 5 mg by mouth in the morning and at bedtime.   Artificial Tear Solution (SOOTHE XP OP) Place 1-2 drops into both eyes 3 (three) times daily as needed (dry/irritated eyes.).   aspirin  EC 81 MG tablet Take 1 tablet (81 mg total) by mouth daily. Swallow whole.   atorvastatin  (LIPITOR) 40 MG tablet Take 1 tablet (40 mg total) by mouth daily.   bisoprolol  (ZEBETA ) 10 MG tablet Take by mouth in the morning.   losartan (COZAAR) 100 MG tablet Take 100 mg by mouth in the morning.   metFORMIN  (GLUCOPHAGE ) 500 MG tablet TAKE 1 TABLET BY MOUTH EVERY DAY WITH BREAKFAST   semaglutide -weight management (Janice Hutchinson ) 0.25 MG/0.5ML SOAJ SQ injection Inject 0.25 mg into the skin once a week.   Facility-Administered Encounter Medications as of 08/16/2024  Medication   Spy Agent Green / Firefly Optime     Follow-Up   Return in about 4 weeks (around 09/13/2024) for For Weight Mangement with Dr. [email or web address].[email or web address] She was informed of the importance of frequent follow up visits to maximize her success with intensive lifestyle modifications for her multiple health conditions.  Attestation Statement   Reviewed by clinician  on day of visit: allergies, medications, problem list, medical history, surgical history, family history, social history, and previous encounter notes.   I have spent 40 minutes in the care of the patient today including: 2 minutes before the visit reviewing and preparing the chart. 26 minutes face-to-face assessing and reviewing listed medical problems as outlined in obesity care plan, providing nutritional and behavioral counseling on topics outlined in the obesity care plan, counseling regarding anti-obesity medication as  outlined in obesity care plan, independently interpreting test results and goals of care, as described in assessment and plan, reviewing and discussing biometric information and progress, and ordering medications - see orders 8 minutes after the visit updating chart and documentation of encounter.    Lucas Parker, MD ABIM, ABOM "

## 2024-08-19 ENCOUNTER — Other Ambulatory Visit: Payer: Self-pay | Admitting: Neurosurgery

## 2024-08-19 DIAGNOSIS — M5416 Radiculopathy, lumbar region: Secondary | ICD-10-CM

## 2024-08-30 ENCOUNTER — Other Ambulatory Visit (INDEPENDENT_AMBULATORY_CARE_PROVIDER_SITE_OTHER): Payer: Self-pay

## 2024-08-30 ENCOUNTER — Telehealth (INDEPENDENT_AMBULATORY_CARE_PROVIDER_SITE_OTHER): Payer: Self-pay | Admitting: Internal Medicine

## 2024-08-30 ENCOUNTER — Other Ambulatory Visit (INDEPENDENT_AMBULATORY_CARE_PROVIDER_SITE_OTHER): Payer: Self-pay | Admitting: Internal Medicine

## 2024-08-30 DIAGNOSIS — R7303 Prediabetes: Secondary | ICD-10-CM

## 2024-08-30 MED ORDER — METFORMIN HCL 500 MG PO TABS: 500.0000 mg | ORAL_TABLET | Freq: Every day | ORAL | 0 refills | Status: AC

## 2024-09-06 ENCOUNTER — Other Ambulatory Visit

## 2024-09-15 ENCOUNTER — Ambulatory Visit (INDEPENDENT_AMBULATORY_CARE_PROVIDER_SITE_OTHER): Admitting: Internal Medicine

## 2024-12-20 ENCOUNTER — Ambulatory Visit: Admitting: Neurology

## 2024-12-27 ENCOUNTER — Ambulatory Visit: Admitting: Neurology
